# Patient Record
Sex: Female | Born: 1970 | Race: Black or African American | Hispanic: No | Marital: Married | Smoking: Never smoker
Health system: Southern US, Community
[De-identification: ages and names within clinical notes are randomized; demographics above are authoritative.]

## PROBLEM LIST (undated history)

## (undated) DIAGNOSIS — Z803 Family history of malignant neoplasm of breast: Secondary | ICD-10-CM

## (undated) DIAGNOSIS — Z8042 Family history of malignant neoplasm of prostate: Secondary | ICD-10-CM

## (undated) DIAGNOSIS — Z9221 Personal history of antineoplastic chemotherapy: Secondary | ICD-10-CM

## (undated) DIAGNOSIS — C801 Malignant (primary) neoplasm, unspecified: Secondary | ICD-10-CM

## (undated) DIAGNOSIS — Z923 Personal history of irradiation: Secondary | ICD-10-CM

## (undated) HISTORY — DX: Family history of malignant neoplasm of breast: Z80.3

## (undated) HISTORY — DX: Family history of malignant neoplasm of prostate: Z80.42

---

## 1999-09-12 ENCOUNTER — Emergency Department (HOSPITAL_COMMUNITY): Admission: EM | Admit: 1999-09-12 | Discharge: 1999-09-12 | Payer: Self-pay | Admitting: Emergency Medicine

## 2001-04-26 ENCOUNTER — Other Ambulatory Visit: Admission: RE | Admit: 2001-04-26 | Discharge: 2001-04-26 | Payer: Self-pay | Admitting: Obstetrics and Gynecology

## 2001-10-18 ENCOUNTER — Inpatient Hospital Stay (HOSPITAL_COMMUNITY): Admission: AD | Admit: 2001-10-18 | Discharge: 2001-10-22 | Payer: Self-pay | Admitting: Obstetrics and Gynecology

## 2001-10-18 ENCOUNTER — Encounter (INDEPENDENT_AMBULATORY_CARE_PROVIDER_SITE_OTHER): Payer: Self-pay

## 2001-10-23 ENCOUNTER — Encounter: Admission: RE | Admit: 2001-10-23 | Discharge: 2001-11-22 | Payer: Self-pay | Admitting: Obstetrics and Gynecology

## 2001-11-19 ENCOUNTER — Other Ambulatory Visit: Admission: RE | Admit: 2001-11-19 | Discharge: 2001-11-19 | Payer: Self-pay | Admitting: Obstetrics and Gynecology

## 2004-09-20 ENCOUNTER — Ambulatory Visit (HOSPITAL_COMMUNITY): Admission: RE | Admit: 2004-09-20 | Discharge: 2004-09-20 | Payer: Self-pay | Admitting: Obstetrics and Gynecology

## 2005-01-03 ENCOUNTER — Inpatient Hospital Stay (HOSPITAL_COMMUNITY): Admission: AD | Admit: 2005-01-03 | Discharge: 2005-01-07 | Payer: Self-pay | Admitting: Obstetrics and Gynecology

## 2005-01-03 ENCOUNTER — Encounter (INDEPENDENT_AMBULATORY_CARE_PROVIDER_SITE_OTHER): Payer: Self-pay | Admitting: *Deleted

## 2006-09-04 DIAGNOSIS — Z9221 Personal history of antineoplastic chemotherapy: Secondary | ICD-10-CM

## 2006-09-04 DIAGNOSIS — Z923 Personal history of irradiation: Secondary | ICD-10-CM

## 2006-09-04 DIAGNOSIS — C50919 Malignant neoplasm of unspecified site of unspecified female breast: Secondary | ICD-10-CM

## 2006-09-04 HISTORY — PX: BREAST LUMPECTOMY: SHX2

## 2006-09-04 HISTORY — DX: Personal history of irradiation: Z92.3

## 2006-09-04 HISTORY — DX: Personal history of antineoplastic chemotherapy: Z92.21

## 2006-09-04 HISTORY — DX: Malignant neoplasm of unspecified site of unspecified female breast: C50.919

## 2007-03-05 ENCOUNTER — Ambulatory Visit: Payer: Self-pay | Admitting: Family Medicine

## 2007-03-15 ENCOUNTER — Encounter: Admission: RE | Admit: 2007-03-15 | Discharge: 2007-03-15 | Payer: Self-pay | Admitting: Family Medicine

## 2007-03-29 ENCOUNTER — Encounter (INDEPENDENT_AMBULATORY_CARE_PROVIDER_SITE_OTHER): Payer: Self-pay | Admitting: Diagnostic Radiology

## 2007-03-29 ENCOUNTER — Encounter: Admission: RE | Admit: 2007-03-29 | Discharge: 2007-03-29 | Payer: Self-pay | Admitting: Family Medicine

## 2007-04-11 ENCOUNTER — Encounter: Admission: RE | Admit: 2007-04-11 | Discharge: 2007-04-11 | Payer: Self-pay | Admitting: Occupational Therapy

## 2007-04-15 ENCOUNTER — Ambulatory Visit (HOSPITAL_COMMUNITY): Admission: RE | Admit: 2007-04-15 | Discharge: 2007-04-15 | Payer: Self-pay | Admitting: General Surgery

## 2007-04-15 ENCOUNTER — Encounter (HOSPITAL_BASED_OUTPATIENT_CLINIC_OR_DEPARTMENT_OTHER): Payer: Self-pay | Admitting: General Surgery

## 2007-05-03 ENCOUNTER — Ambulatory Visit: Payer: Self-pay | Admitting: Oncology

## 2007-05-15 LAB — CBC WITH DIFFERENTIAL/PLATELET
BASO%: 0.5 % (ref 0.0–2.0)
Basophils Absolute: 0 10*3/uL (ref 0.0–0.1)
EOS%: 3.9 % (ref 0.0–7.0)
Eosinophils Absolute: 0.2 10*3/uL (ref 0.0–0.5)
HCT: 40 % (ref 34.8–46.6)
HGB: 13.8 g/dL (ref 11.6–15.9)
LYMPH%: 27.4 % (ref 14.0–48.0)
MCH: 29.8 pg (ref 26.0–34.0)
MCHC: 34.5 g/dL (ref 32.0–36.0)
MCV: 86.6 fL (ref 81.0–101.0)
MONO#: 0.3 10*3/uL (ref 0.1–0.9)
MONO%: 5.8 % (ref 0.0–13.0)
NEUT#: 3.7 10*3/uL (ref 1.5–6.5)
NEUT%: 62.4 % (ref 39.6–76.8)
Platelets: 251 10*3/uL (ref 145–400)
RBC: 4.61 10*6/uL (ref 3.70–5.32)
RDW: 13.1 % (ref 11.3–14.5)
WBC: 5.9 10*3/uL (ref 3.9–10.0)
lymph#: 1.6 10*3/uL (ref 0.9–3.3)

## 2007-05-16 ENCOUNTER — Encounter: Payer: Self-pay | Admitting: Oncology

## 2007-05-16 ENCOUNTER — Ambulatory Visit: Admission: RE | Admit: 2007-05-16 | Discharge: 2007-05-16 | Payer: Self-pay | Admitting: Oncology

## 2007-05-17 ENCOUNTER — Ambulatory Visit (HOSPITAL_COMMUNITY): Admission: RE | Admit: 2007-05-17 | Discharge: 2007-05-17 | Payer: Self-pay | Admitting: Oncology

## 2007-05-20 ENCOUNTER — Encounter (HOSPITAL_COMMUNITY): Admission: RE | Admit: 2007-05-20 | Discharge: 2007-06-04 | Payer: Self-pay | Admitting: Oncology

## 2007-05-20 LAB — COMPREHENSIVE METABOLIC PANEL
ALT: 9 U/L (ref 0–35)
AST: 15 U/L (ref 0–37)
Albumin: 4.5 g/dL (ref 3.5–5.2)
Alkaline Phosphatase: 64 U/L (ref 39–117)
BUN: 11 mg/dL (ref 6–23)
CO2: 26 mEq/L (ref 19–32)
Calcium: 9.8 mg/dL (ref 8.4–10.5)
Chloride: 104 mEq/L (ref 96–112)
Creatinine, Ser: 0.67 mg/dL (ref 0.40–1.20)
Glucose, Bld: 100 mg/dL — ABNORMAL HIGH (ref 70–99)
Potassium: 3.6 mEq/L (ref 3.5–5.3)
Sodium: 139 mEq/L (ref 135–145)
Total Bilirubin: 0.5 mg/dL (ref 0.3–1.2)
Total Protein: 7.8 g/dL (ref 6.0–8.3)

## 2007-05-20 LAB — LACTATE DEHYDROGENASE: LDH: 145 U/L (ref 94–250)

## 2007-05-20 LAB — CANCER ANTIGEN 27.29: CA 27.29: 24 U/mL (ref 0–39)

## 2007-05-20 LAB — VITAMIN D PNL(25-HYDRXY+1,25-DIHY)-BLD
Vit D, 1,25-Dihydroxy: 26 pg/mL (ref 6–62)
Vit D, 25-Hydroxy: 11 ng/mL — ABNORMAL LOW (ref 20–57)

## 2007-05-22 ENCOUNTER — Ambulatory Visit (HOSPITAL_BASED_OUTPATIENT_CLINIC_OR_DEPARTMENT_OTHER): Admission: RE | Admit: 2007-05-22 | Discharge: 2007-05-22 | Payer: Self-pay | Admitting: General Surgery

## 2007-05-22 ENCOUNTER — Ambulatory Visit: Payer: Self-pay | Admitting: *Deleted

## 2007-05-31 LAB — PROTIME-INR
INR: 1.4 — ABNORMAL LOW (ref 2.00–3.50)
Protime: 16.8 Seconds — ABNORMAL HIGH (ref 10.6–13.4)

## 2007-06-04 LAB — PROTIME-INR
INR: 2.3 (ref 2.00–3.50)
Protime: 27.6 Seconds — ABNORMAL HIGH (ref 10.6–13.4)

## 2007-06-04 LAB — CBC WITH DIFFERENTIAL/PLATELET
BASO%: 1.1 % (ref 0.0–2.0)
Basophils Absolute: 0 10*3/uL (ref 0.0–0.1)
EOS%: 3.4 % (ref 0.0–7.0)
Eosinophils Absolute: 0.1 10*3/uL (ref 0.0–0.5)
HCT: 39.3 % (ref 34.8–46.6)
HGB: 12.9 g/dL (ref 11.6–15.9)
LYMPH%: 29.1 % (ref 14.0–48.0)
MCH: 29.2 pg (ref 26.0–34.0)
MCHC: 32.8 g/dL (ref 32.0–36.0)
MCV: 89.1 fL (ref 81.0–101.0)
MONO#: 0.2 10*3/uL (ref 0.1–0.9)
MONO%: 5.6 % (ref 0.0–13.0)
NEUT#: 2.2 10*3/uL (ref 1.5–6.5)
NEUT%: 60.9 % (ref 39.6–76.8)
Platelets: 166 10*3/uL (ref 145–400)
RBC: 4.41 10*6/uL (ref 3.70–5.32)
RDW: 10.5 % — ABNORMAL LOW (ref 11.3–14.5)
WBC: 3.7 10*3/uL — ABNORMAL LOW (ref 3.9–10.0)
lymph#: 1.1 10*3/uL (ref 0.9–3.3)

## 2007-06-10 LAB — COMPREHENSIVE METABOLIC PANEL
ALT: 12 U/L (ref 0–35)
AST: 16 U/L (ref 0–37)
Albumin: 4.2 g/dL (ref 3.5–5.2)
Alkaline Phosphatase: 75 U/L (ref 39–117)
BUN: 14 mg/dL (ref 6–23)
CO2: 24 mEq/L (ref 19–32)
Calcium: 9.2 mg/dL (ref 8.4–10.5)
Chloride: 106 mEq/L (ref 96–112)
Creatinine, Ser: 0.6 mg/dL (ref 0.40–1.20)
Glucose, Bld: 89 mg/dL (ref 70–99)
Potassium: 4.2 mEq/L (ref 3.5–5.3)
Sodium: 140 mEq/L (ref 135–145)
Total Bilirubin: 0.1 mg/dL — ABNORMAL LOW (ref 0.3–1.2)
Total Protein: 6.9 g/dL (ref 6.0–8.3)

## 2007-06-10 LAB — CBC WITH DIFFERENTIAL/PLATELET
BASO%: 0.6 % (ref 0.0–2.0)
Basophils Absolute: 0.1 10*3/uL (ref 0.0–0.1)
EOS%: 0.3 % (ref 0.0–7.0)
Eosinophils Absolute: 0 10*3/uL (ref 0.0–0.5)
HCT: 38.8 % (ref 34.8–46.6)
HGB: 13.1 g/dL (ref 11.6–15.9)
LYMPH%: 18.9 % (ref 14.0–48.0)
MCH: 29.6 pg (ref 26.0–34.0)
MCHC: 33.9 g/dL (ref 32.0–36.0)
MCV: 87.5 fL (ref 81.0–101.0)
MONO#: 0.6 10*3/uL (ref 0.1–0.9)
MONO%: 6.5 % (ref 0.0–13.0)
NEUT#: 7.3 10*3/uL — ABNORMAL HIGH (ref 1.5–6.5)
NEUT%: 73.8 % (ref 39.6–76.8)
Platelets: 217 10*3/uL (ref 145–400)
RBC: 4.43 10*6/uL (ref 3.70–5.32)
RDW: 10.3 % — ABNORMAL LOW (ref 11.3–14.5)
WBC: 9.9 10*3/uL (ref 3.9–10.0)
lymph#: 1.9 10*3/uL (ref 0.9–3.3)

## 2007-06-10 LAB — PROTIME-INR
INR: 2.7 (ref 2.00–3.50)
Protime: 32.4 Seconds — ABNORMAL HIGH (ref 10.6–13.4)

## 2007-06-14 ENCOUNTER — Ambulatory Visit: Payer: Self-pay | Admitting: Oncology

## 2007-06-18 LAB — CBC WITH DIFFERENTIAL/PLATELET
BASO%: 0.9 % (ref 0.0–2.0)
Basophils Absolute: 0 10*3/uL (ref 0.0–0.1)
EOS%: 0.3 % (ref 0.0–7.0)
Eosinophils Absolute: 0 10*3/uL (ref 0.0–0.5)
HCT: 37.3 % (ref 34.8–46.6)
HGB: 12.4 g/dL (ref 11.6–15.9)
LYMPH%: 21.9 % (ref 14.0–48.0)
MCH: 29.1 pg (ref 26.0–34.0)
MCHC: 33.4 g/dL (ref 32.0–36.0)
MCV: 87.3 fL (ref 81.0–101.0)
MONO#: 0.2 10*3/uL (ref 0.1–0.9)
MONO%: 4.7 % (ref 0.0–13.0)
NEUT#: 3.7 10*3/uL (ref 1.5–6.5)
NEUT%: 72.2 % (ref 39.6–76.8)
Platelets: 206 10*3/uL (ref 145–400)
RBC: 4.27 10*6/uL (ref 3.70–5.32)
RDW: 10.4 % — ABNORMAL LOW (ref 11.3–14.5)
WBC: 5.1 10*3/uL (ref 3.9–10.0)
lymph#: 1.1 10*3/uL (ref 0.9–3.3)

## 2007-06-18 LAB — PROTIME-INR
INR: 1.5 — ABNORMAL LOW (ref 2.00–3.50)
Protime: 18 Seconds — ABNORMAL HIGH (ref 10.6–13.4)

## 2007-06-24 LAB — CBC WITH DIFFERENTIAL/PLATELET
BASO%: 0.5 % (ref 0.0–2.0)
Basophils Absolute: 0.1 10*3/uL (ref 0.0–0.1)
EOS%: 0.3 % (ref 0.0–7.0)
Eosinophils Absolute: 0 10*3/uL (ref 0.0–0.5)
HCT: 33.7 % — ABNORMAL LOW (ref 34.8–46.6)
HGB: 11.6 g/dL (ref 11.6–15.9)
LYMPH%: 11.3 % — ABNORMAL LOW (ref 14.0–48.0)
MCH: 29.9 pg (ref 26.0–34.0)
MCHC: 34.4 g/dL (ref 32.0–36.0)
MCV: 86.9 fL (ref 81.0–101.0)
MONO#: 0.9 10*3/uL (ref 0.1–0.9)
MONO%: 6.8 % (ref 0.0–13.0)
NEUT#: 11.3 10*3/uL — ABNORMAL HIGH (ref 1.5–6.5)
NEUT%: 81.1 % — ABNORMAL HIGH (ref 39.6–76.8)
Platelets: 236 10*3/uL (ref 145–400)
RBC: 3.88 10*6/uL (ref 3.70–5.32)
RDW: 10.6 % — ABNORMAL LOW (ref 11.3–14.5)
WBC: 14 10*3/uL — ABNORMAL HIGH (ref 3.9–10.0)
lymph#: 1.6 10*3/uL (ref 0.9–3.3)

## 2007-06-24 LAB — PROTIME-INR
INR: 2.6 (ref 2.00–3.50)
Protime: 31.2 Seconds — ABNORMAL HIGH (ref 10.6–13.4)

## 2007-07-02 LAB — CBC WITH DIFFERENTIAL/PLATELET
BASO%: 0.9 % (ref 0.0–2.0)
Basophils Absolute: 0 10*3/uL (ref 0.0–0.1)
EOS%: 0.9 % (ref 0.0–7.0)
Eosinophils Absolute: 0 10*3/uL (ref 0.0–0.5)
HCT: 34.1 % — ABNORMAL LOW (ref 34.8–46.6)
HGB: 11.9 g/dL (ref 11.6–15.9)
LYMPH%: 16.5 % (ref 14.0–48.0)
MCH: 29.5 pg (ref 26.0–34.0)
MCHC: 34.8 g/dL (ref 32.0–36.0)
MCV: 84.8 fL (ref 81.0–101.0)
MONO#: 0.3 10*3/uL (ref 0.1–0.9)
MONO%: 6.3 % (ref 0.0–13.0)
NEUT#: 3.8 10*3/uL (ref 1.5–6.5)
NEUT%: 75.4 % (ref 39.6–76.8)
Platelets: 202 10*3/uL (ref 145–400)
RBC: 4.02 10*6/uL (ref 3.70–5.32)
RDW: 10.3 % — ABNORMAL LOW (ref 11.3–14.5)
WBC: 5.1 10*3/uL (ref 3.9–10.0)
lymph#: 0.8 10*3/uL — ABNORMAL LOW (ref 0.9–3.3)

## 2007-07-08 LAB — COMPREHENSIVE METABOLIC PANEL
ALT: 19 U/L (ref 0–35)
AST: 17 U/L (ref 0–37)
Albumin: 4.3 g/dL (ref 3.5–5.2)
Alkaline Phosphatase: 87 U/L (ref 39–117)
BUN: 12 mg/dL (ref 6–23)
CO2: 23 mEq/L (ref 19–32)
Calcium: 9.2 mg/dL (ref 8.4–10.5)
Chloride: 104 mEq/L (ref 96–112)
Creatinine, Ser: 0.59 mg/dL (ref 0.40–1.20)
Glucose, Bld: 87 mg/dL (ref 70–99)
Potassium: 4.6 mEq/L (ref 3.5–5.3)
Sodium: 138 mEq/L (ref 135–145)
Total Bilirubin: 0.2 mg/dL — ABNORMAL LOW (ref 0.3–1.2)
Total Protein: 7.2 g/dL (ref 6.0–8.3)

## 2007-07-08 LAB — CBC WITH DIFFERENTIAL/PLATELET
BASO%: 0.7 % (ref 0.0–2.0)
Basophils Absolute: 0.1 10*3/uL (ref 0.0–0.1)
EOS%: 0.5 % (ref 0.0–7.0)
Eosinophils Absolute: 0.1 10*3/uL (ref 0.0–0.5)
HCT: 33.9 % — ABNORMAL LOW (ref 34.8–46.6)
HGB: 11.3 g/dL — ABNORMAL LOW (ref 11.6–15.9)
LYMPH%: 12.1 % — ABNORMAL LOW (ref 14.0–48.0)
MCH: 28.9 pg (ref 26.0–34.0)
MCHC: 33.4 g/dL (ref 32.0–36.0)
MCV: 86.4 fL (ref 81.0–101.0)
MONO#: 1.1 10*3/uL — ABNORMAL HIGH (ref 0.1–0.9)
MONO%: 8.2 % (ref 0.0–13.0)
NEUT#: 10.2 10*3/uL — ABNORMAL HIGH (ref 1.5–6.5)
NEUT%: 78.5 % — ABNORMAL HIGH (ref 39.6–76.8)
Platelets: 189 10*3/uL (ref 145–400)
RBC: 3.93 10*6/uL (ref 3.70–5.32)
RDW: 12 % (ref 11.3–14.5)
WBC: 13 10*3/uL — ABNORMAL HIGH (ref 3.9–10.0)
lymph#: 1.6 10*3/uL (ref 0.9–3.3)

## 2007-07-16 ENCOUNTER — Ambulatory Visit (HOSPITAL_COMMUNITY): Admission: RE | Admit: 2007-07-16 | Discharge: 2007-07-16 | Payer: Self-pay | Admitting: Oncology

## 2007-07-16 LAB — CBC WITH DIFFERENTIAL/PLATELET
BASO%: 1.5 % (ref 0.0–2.0)
Basophils Absolute: 0 10*3/uL (ref 0.0–0.1)
EOS%: 1.6 % (ref 0.0–7.0)
Eosinophils Absolute: 0 10*3/uL (ref 0.0–0.5)
HCT: 31.8 % — ABNORMAL LOW (ref 34.8–46.6)
HGB: 11 g/dL — ABNORMAL LOW (ref 11.6–15.9)
LYMPH%: 22.9 % (ref 14.0–48.0)
MCH: 29.5 pg (ref 26.0–34.0)
MCHC: 34.6 g/dL (ref 32.0–36.0)
MCV: 85.3 fL (ref 81.0–101.0)
MONO#: 0.3 10*3/uL (ref 0.1–0.9)
MONO%: 9.2 % (ref 0.0–13.0)
NEUT#: 2 10*3/uL (ref 1.5–6.5)
NEUT%: 64.8 % (ref 39.6–76.8)
Platelets: 166 10*3/uL (ref 145–400)
RBC: 3.72 10*6/uL (ref 3.70–5.32)
RDW: 11.5 % (ref 11.3–14.5)
WBC: 3 10*3/uL — ABNORMAL LOW (ref 3.9–10.0)
lymph#: 0.7 10*3/uL — ABNORMAL LOW (ref 0.9–3.3)

## 2007-07-22 LAB — CBC WITH DIFFERENTIAL/PLATELET
BASO%: 0.2 % (ref 0.0–2.0)
Basophils Absolute: 0 10*3/uL (ref 0.0–0.1)
EOS%: 0.7 % (ref 0.0–7.0)
Eosinophils Absolute: 0.1 10*3/uL (ref 0.0–0.5)
HCT: 29.2 % — ABNORMAL LOW (ref 34.8–46.6)
HGB: 9.9 g/dL — ABNORMAL LOW (ref 11.6–15.9)
LYMPH%: 10.7 % — ABNORMAL LOW (ref 14.0–48.0)
MCH: 29 pg (ref 26.0–34.0)
MCHC: 33.8 g/dL (ref 32.0–36.0)
MCV: 85.8 fL (ref 81.0–101.0)
MONO#: 0.6 10*3/uL (ref 0.1–0.9)
MONO%: 6.5 % (ref 0.0–13.0)
NEUT#: 7.2 10*3/uL — ABNORMAL HIGH (ref 1.5–6.5)
NEUT%: 81.9 % — ABNORMAL HIGH (ref 39.6–76.8)
Platelets: 217 10*3/uL (ref 145–400)
RBC: 3.4 10*6/uL — ABNORMAL LOW (ref 3.70–5.32)
RDW: 13.7 % (ref 11.3–14.5)
WBC: 8.8 10*3/uL (ref 3.9–10.0)
lymph#: 0.9 10*3/uL (ref 0.9–3.3)

## 2007-07-25 LAB — HYPERCOAGULABLE PANEL, COMPREHENSIVE
AntiThromb III Func: 121 % (ref 76–126)
Anticardiolipin IgA: <7 [APL'U] (ref <13)
Anticardiolipin IgG: <7 [GPL'U] (ref <11)
Anticardiolipin IgM: <7 [MPL'U] (ref <10)
Beta-2 Glyco I IgG: <4 U/mL (ref <20)
Beta-2-Glycoprotein I IgA: 4 U/mL (ref <10)
Beta-2-Glycoprotein I IgM: <4 U/mL (ref <10)
DRVVT: 37.9 secs (ref 36.1–47.0)
Homocysteine: 8 umol/L (ref 4.0–15.4)
PTT Lupus Anticoagulant: 36.8 secs (ref 36.3–48.8)
Protein C Activity: 119 % (ref 75–133)
Protein C, Total: 76 % (ref 70–140)
Protein S Activity: 45 % — ABNORMAL LOW (ref 69–129)
Protein S Ag, Total: 99 % (ref 70–140)

## 2007-07-29 LAB — CBC WITH DIFFERENTIAL/PLATELET
BASO%: 1.1 % (ref 0.0–2.0)
Basophils Absolute: 0.1 10*3/uL (ref 0.0–0.1)
EOS%: 0.7 % (ref 0.0–7.0)
Eosinophils Absolute: 0 10*3/uL (ref 0.0–0.5)
HCT: 28.7 % — ABNORMAL LOW (ref 34.8–46.6)
HGB: 9.7 g/dL — ABNORMAL LOW (ref 11.6–15.9)
LYMPH%: 14 % (ref 14.0–48.0)
MCH: 29.2 pg (ref 26.0–34.0)
MCHC: 33.9 g/dL (ref 32.0–36.0)
MCV: 86 fL (ref 81.0–101.0)
MONO#: 0.5 10*3/uL (ref 0.1–0.9)
MONO%: 7.7 % (ref 0.0–13.0)
NEUT#: 4.5 10*3/uL (ref 1.5–6.5)
NEUT%: 76.5 % (ref 39.6–76.8)
Platelets: 310 10*3/uL (ref 145–400)
RBC: 3.33 10*6/uL — ABNORMAL LOW (ref 3.70–5.32)
RDW: 13.5 % (ref 11.3–14.5)
WBC: 5.9 10*3/uL (ref 3.9–10.0)
lymph#: 0.8 10*3/uL — ABNORMAL LOW (ref 0.9–3.3)

## 2007-07-30 ENCOUNTER — Ambulatory Visit: Payer: Self-pay | Admitting: Oncology

## 2007-08-05 LAB — CBC WITH DIFFERENTIAL/PLATELET
BASO%: 1.7 % (ref 0.0–2.0)
Basophils Absolute: 0 10*3/uL (ref 0.0–0.1)
EOS%: 0.5 % (ref 0.0–7.0)
Eosinophils Absolute: 0 10*3/uL (ref 0.0–0.5)
HCT: 31.4 % — ABNORMAL LOW (ref 34.8–46.6)
HGB: 10.6 g/dL — ABNORMAL LOW (ref 11.6–15.9)
LYMPH%: 23.2 % (ref 14.0–48.0)
MCH: 29.7 pg (ref 26.0–34.0)
MCHC: 33.8 g/dL (ref 32.0–36.0)
MCV: 87.8 fL (ref 81.0–101.0)
MONO#: 0.8 10*3/uL (ref 0.1–0.9)
MONO%: 34.3 % — ABNORMAL HIGH (ref 0.0–13.0)
NEUT#: 1 10*3/uL — ABNORMAL LOW (ref 1.5–6.5)
NEUT%: 40.3 % (ref 39.6–76.8)
Platelets: 289 10*3/uL (ref 145–400)
RBC: 3.57 10*6/uL — ABNORMAL LOW (ref 3.70–5.32)
RDW: 15.5 % — ABNORMAL HIGH (ref 11.3–14.5)
WBC: 2.5 10*3/uL — ABNORMAL LOW (ref 3.9–10.0)
lymph#: 0.6 10*3/uL — ABNORMAL LOW (ref 0.9–3.3)

## 2007-08-12 LAB — CBC WITH DIFFERENTIAL/PLATELET
BASO%: 2.2 % — ABNORMAL HIGH (ref 0.0–2.0)
Basophils Absolute: 0.1 10*3/uL (ref 0.0–0.1)
EOS%: 4.8 % (ref 0.0–7.0)
Eosinophils Absolute: 0.1 10*3/uL (ref 0.0–0.5)
HCT: 30.5 % — ABNORMAL LOW (ref 34.8–46.6)
HGB: 10.4 g/dL — ABNORMAL LOW (ref 11.6–15.9)
LYMPH%: 56.3 % — ABNORMAL HIGH (ref 14.0–48.0)
MCH: 30 pg (ref 26.0–34.0)
MCHC: 34.3 g/dL (ref 32.0–36.0)
MCV: 87.4 fL (ref 81.0–101.0)
MONO#: 0.6 10*3/uL (ref 0.1–0.9)
MONO%: 21.6 % — ABNORMAL HIGH (ref 0.0–13.0)
NEUT#: 0.4 10*3/uL — CL (ref 1.5–6.5)
NEUT%: 15.1 % — ABNORMAL LOW (ref 39.6–76.8)
Platelets: 396 10*3/uL (ref 145–400)
RBC: 3.48 10*6/uL — ABNORMAL LOW (ref 3.70–5.32)
RDW: 17.6 % — ABNORMAL HIGH (ref 11.3–14.5)
WBC: 2.8 10*3/uL — ABNORMAL LOW (ref 3.9–10.0)
lymph#: 1.6 10*3/uL (ref 0.9–3.3)

## 2007-08-12 LAB — TECHNOLOGIST REVIEW

## 2007-08-19 LAB — CBC WITH DIFFERENTIAL/PLATELET
BASO%: 1.1 % (ref 0.0–2.0)
Basophils Absolute: 0 10*3/uL (ref 0.0–0.1)
EOS%: 3.1 % (ref 0.0–7.0)
Eosinophils Absolute: 0.1 10*3/uL (ref 0.0–0.5)
HCT: 32.7 % — ABNORMAL LOW (ref 34.8–46.6)
HGB: 10.6 g/dL — ABNORMAL LOW (ref 11.6–15.9)
LYMPH%: 39.9 % (ref 14.0–48.0)
MCH: 29.3 pg (ref 26.0–34.0)
MCHC: 32.5 g/dL (ref 32.0–36.0)
MCV: 90.2 fL (ref 81.0–101.0)
MONO#: 0.5 10*3/uL (ref 0.1–0.9)
MONO%: 14.9 % — ABNORMAL HIGH (ref 0.0–13.0)
NEUT#: 1.5 10*3/uL (ref 1.5–6.5)
NEUT%: 41 % (ref 39.6–76.8)
Platelets: 331 10*3/uL (ref 145–400)
RBC: 3.63 10*6/uL — ABNORMAL LOW (ref 3.70–5.32)
RDW: 14.3 % (ref 11.3–14.5)
WBC: 3.7 10*3/uL — ABNORMAL LOW (ref 3.9–10.0)
lymph#: 1.5 10*3/uL (ref 0.9–3.3)

## 2007-08-26 LAB — CBC WITH DIFFERENTIAL/PLATELET
BASO%: 0.5 % (ref 0.0–2.0)
Basophils Absolute: 0 10*3/uL (ref 0.0–0.1)
EOS%: 2.2 % (ref 0.0–7.0)
Eosinophils Absolute: 0.1 10*3/uL (ref 0.0–0.5)
HCT: 33.1 % — ABNORMAL LOW (ref 34.8–46.6)
HGB: 11.4 g/dL — ABNORMAL LOW (ref 11.6–15.9)
LYMPH%: 25.3 % (ref 14.0–48.0)
MCH: 30.1 pg (ref 26.0–34.0)
MCHC: 34.6 g/dL (ref 32.0–36.0)
MCV: 87.1 fL (ref 81.0–101.0)
MONO#: 0.2 10*3/uL (ref 0.1–0.9)
MONO%: 5.7 % (ref 0.0–13.0)
NEUT#: 2.9 10*3/uL (ref 1.5–6.5)
NEUT%: 66.3 % (ref 39.6–76.8)
Platelets: 212 10*3/uL (ref 145–400)
RBC: 3.8 10*6/uL (ref 3.70–5.32)
RDW: 17 % — ABNORMAL HIGH (ref 11.3–14.5)
WBC: 4.4 10*3/uL (ref 3.9–10.0)
lymph#: 1.1 10*3/uL (ref 0.9–3.3)

## 2007-09-05 ENCOUNTER — Ambulatory Visit: Admission: RE | Admit: 2007-09-05 | Discharge: 2007-11-10 | Payer: Self-pay | Admitting: Radiation Oncology

## 2007-09-16 ENCOUNTER — Ambulatory Visit: Payer: Self-pay | Admitting: Oncology

## 2007-09-16 LAB — CBC WITH DIFFERENTIAL/PLATELET
BASO%: 0.6 % (ref 0.0–2.0)
Basophils Absolute: 0 10*3/uL (ref 0.0–0.1)
EOS%: 2.2 % (ref 0.0–7.0)
Eosinophils Absolute: 0.1 10*3/uL (ref 0.0–0.5)
HCT: 34.5 % — ABNORMAL LOW (ref 34.8–46.6)
HGB: 11.2 g/dL — ABNORMAL LOW (ref 11.6–15.9)
LYMPH%: 31.9 % (ref 14.0–48.0)
MCH: 29.1 pg (ref 26.0–34.0)
MCHC: 32.4 g/dL (ref 32.0–36.0)
MCV: 90 fL (ref 81.0–101.0)
MONO#: 0.3 10*3/uL (ref 0.1–0.9)
MONO%: 8.6 % (ref 0.0–13.0)
NEUT#: 2 10*3/uL (ref 1.5–6.5)
NEUT%: 56.7 % (ref 39.6–76.8)
Platelets: 224 10*3/uL (ref 145–400)
RBC: 3.84 10*6/uL (ref 3.70–5.32)
RDW: 11.9 % (ref 11.3–14.5)
WBC: 3.6 10*3/uL — ABNORMAL LOW (ref 3.9–10.0)
lymph#: 1.1 10*3/uL (ref 0.9–3.3)

## 2007-11-21 ENCOUNTER — Ambulatory Visit: Payer: Self-pay | Admitting: Oncology

## 2007-11-25 LAB — COMPREHENSIVE METABOLIC PANEL
ALT: 20 U/L (ref 0–35)
AST: 24 U/L (ref 0–37)
Albumin: 4.2 g/dL (ref 3.5–5.2)
Alkaline Phosphatase: 66 U/L (ref 39–117)
BUN: 10 mg/dL (ref 6–23)
CO2: 25 mEq/L (ref 19–32)
Calcium: 9.5 mg/dL (ref 8.4–10.5)
Chloride: 103 mEq/L (ref 96–112)
Creatinine, Ser: 0.66 mg/dL (ref 0.40–1.20)
Glucose, Bld: 93 mg/dL (ref 70–99)
Potassium: 4.6 mEq/L (ref 3.5–5.3)
Sodium: 140 mEq/L (ref 135–145)
Total Bilirubin: 0.2 mg/dL — ABNORMAL LOW (ref 0.3–1.2)
Total Protein: 7.4 g/dL (ref 6.0–8.3)

## 2007-11-25 LAB — CBC WITH DIFFERENTIAL/PLATELET
BASO%: 0.4 % (ref 0.0–2.0)
Basophils Absolute: 0 10*3/uL (ref 0.0–0.1)
EOS%: 3.9 % (ref 0.0–7.0)
Eosinophils Absolute: 0.2 10*3/uL (ref 0.0–0.5)
HCT: 37.2 % (ref 34.8–46.6)
HGB: 12.8 g/dL (ref 11.6–15.9)
LYMPH%: 19.5 % (ref 14.0–48.0)
MCH: 29.6 pg (ref 26.0–34.0)
MCHC: 34.3 g/dL (ref 32.0–36.0)
MCV: 86.3 fL (ref 81.0–101.0)
MONO#: 0.3 10*3/uL (ref 0.1–0.9)
MONO%: 7.6 % (ref 0.0–13.0)
NEUT#: 2.7 10*3/uL (ref 1.5–6.5)
NEUT%: 68.6 % (ref 39.6–76.8)
Platelets: 217 10*3/uL (ref 145–400)
RBC: 4.3 10*6/uL (ref 3.70–5.32)
RDW: 11.8 % (ref 11.3–14.5)
WBC: 4 10*3/uL (ref 3.9–10.0)
lymph#: 0.8 10*3/uL — ABNORMAL LOW (ref 0.9–3.3)

## 2007-11-25 LAB — CANCER ANTIGEN 27.29: CA 27.29: 17 U/mL (ref 0–39)

## 2007-11-25 LAB — LACTATE DEHYDROGENASE: LDH: 156 U/L (ref 94–250)

## 2007-12-26 ENCOUNTER — Ambulatory Visit (HOSPITAL_BASED_OUTPATIENT_CLINIC_OR_DEPARTMENT_OTHER): Admission: RE | Admit: 2007-12-26 | Discharge: 2007-12-26 | Payer: Self-pay | Admitting: General Surgery

## 2008-01-23 ENCOUNTER — Ambulatory Visit: Payer: Self-pay | Admitting: Oncology

## 2008-03-16 ENCOUNTER — Encounter: Admission: RE | Admit: 2008-03-16 | Discharge: 2008-03-16 | Payer: Self-pay | Admitting: Oncology

## 2008-05-04 ENCOUNTER — Encounter: Admission: RE | Admit: 2008-05-04 | Discharge: 2008-05-04 | Payer: Self-pay | Admitting: Oncology

## 2008-07-24 ENCOUNTER — Ambulatory Visit: Payer: Self-pay | Admitting: Oncology

## 2008-11-02 ENCOUNTER — Ambulatory Visit: Payer: Self-pay | Admitting: Oncology

## 2008-12-23 ENCOUNTER — Ambulatory Visit: Payer: Self-pay | Admitting: Oncology

## 2008-12-25 LAB — CBC WITH DIFFERENTIAL/PLATELET
BASO%: 0.5 % (ref 0.0–2.0)
Basophils Absolute: 0 10*3/uL (ref 0.0–0.1)
EOS%: 3 % (ref 0.0–7.0)
Eosinophils Absolute: 0.1 10*3/uL (ref 0.0–0.5)
HCT: 37.8 % (ref 34.8–46.6)
HGB: 12.7 g/dL (ref 11.6–15.9)
LYMPH%: 28.1 % (ref 14.0–49.7)
MCH: 30.3 pg (ref 25.1–34.0)
MCHC: 33.5 g/dL (ref 31.5–36.0)
MCV: 90.4 fL (ref 79.5–101.0)
MONO#: 0.3 10*3/uL (ref 0.1–0.9)
MONO%: 6.3 % (ref 0.0–14.0)
NEUT#: 2.7 10*3/uL (ref 1.5–6.5)
NEUT%: 62.1 % (ref 38.4–76.8)
Platelets: 199 10*3/uL (ref 145–400)
RBC: 4.19 10*6/uL (ref 3.70–5.45)
RDW: 12.8 % (ref 11.2–14.5)
WBC: 4.3 10*3/uL (ref 3.9–10.3)
lymph#: 1.2 10*3/uL (ref 0.9–3.3)

## 2008-12-25 LAB — COMPREHENSIVE METABOLIC PANEL
ALT: 13 U/L (ref 0–35)
AST: 17 U/L (ref 0–37)
Albumin: 3.5 g/dL (ref 3.5–5.2)
Alkaline Phosphatase: 59 U/L (ref 39–117)
BUN: 9 mg/dL (ref 6–23)
CO2: 31 mEq/L (ref 19–32)
Calcium: 9 mg/dL (ref 8.4–10.5)
Chloride: 107 mEq/L (ref 96–112)
Creatinine, Ser: 0.64 mg/dL (ref 0.40–1.20)
Glucose, Bld: 111 mg/dL — ABNORMAL HIGH (ref 70–99)
Potassium: 3.7 mEq/L (ref 3.5–5.3)
Sodium: 140 mEq/L (ref 135–145)
Total Bilirubin: 0.5 mg/dL (ref 0.3–1.2)
Total Protein: 6 g/dL (ref 6.0–8.3)

## 2009-01-15 ENCOUNTER — Other Ambulatory Visit: Admission: RE | Admit: 2009-01-15 | Discharge: 2009-01-15 | Payer: Self-pay | Admitting: Family Medicine

## 2009-03-17 ENCOUNTER — Encounter: Admission: RE | Admit: 2009-03-17 | Discharge: 2009-03-17 | Payer: Self-pay | Admitting: Oncology

## 2009-06-24 ENCOUNTER — Ambulatory Visit: Payer: Self-pay | Admitting: Oncology

## 2009-09-10 ENCOUNTER — Ambulatory Visit: Payer: Self-pay | Admitting: Oncology

## 2009-09-14 LAB — CBC WITH DIFFERENTIAL/PLATELET
BASO%: 0.5 % (ref 0.0–2.0)
Basophils Absolute: 0 10*3/uL (ref 0.0–0.1)
EOS%: 3.1 % (ref 0.0–7.0)
Eosinophils Absolute: 0.2 10*3/uL (ref 0.0–0.5)
HCT: 40.2 % (ref 34.8–46.6)
HGB: 13.3 g/dL (ref 11.6–15.9)
LYMPH%: 26.5 % (ref 14.0–49.7)
MCH: 30.3 pg (ref 25.1–34.0)
MCHC: 33.2 g/dL (ref 31.5–36.0)
MCV: 91.2 fL (ref 79.5–101.0)
MONO#: 0.4 10*3/uL (ref 0.1–0.9)
MONO%: 6.8 % (ref 0.0–14.0)
NEUT#: 3.3 10*3/uL (ref 1.5–6.5)
NEUT%: 63.1 % (ref 38.4–76.8)
Platelets: 213 10*3/uL (ref 145–400)
RBC: 4.4 10*6/uL (ref 3.70–5.45)
RDW: 12.9 % (ref 11.2–14.5)
WBC: 5.2 10*3/uL (ref 3.9–10.3)
lymph#: 1.4 10*3/uL (ref 0.9–3.3)

## 2009-09-14 LAB — COMPREHENSIVE METABOLIC PANEL
ALT: 18 U/L (ref 0–35)
AST: 21 U/L (ref 0–37)
Albumin: 4 g/dL (ref 3.5–5.2)
Alkaline Phosphatase: 54 U/L (ref 39–117)
BUN: 8 mg/dL (ref 6–23)
CO2: 27 mEq/L (ref 19–32)
Calcium: 9.2 mg/dL (ref 8.4–10.5)
Chloride: 106 mEq/L (ref 96–112)
Creatinine, Ser: 0.52 mg/dL (ref 0.40–1.20)
Glucose, Bld: 78 mg/dL (ref 70–99)
Potassium: 3.8 mEq/L (ref 3.5–5.3)
Sodium: 139 mEq/L (ref 135–145)
Total Bilirubin: 0.6 mg/dL (ref 0.3–1.2)
Total Protein: 7.4 g/dL (ref 6.0–8.3)

## 2010-03-22 ENCOUNTER — Encounter: Admission: RE | Admit: 2010-03-22 | Discharge: 2010-03-22 | Payer: Self-pay | Admitting: Oncology

## 2010-03-25 ENCOUNTER — Ambulatory Visit: Payer: Self-pay | Admitting: Oncology

## 2010-06-08 ENCOUNTER — Ambulatory Visit (HOSPITAL_COMMUNITY): Admission: RE | Admit: 2010-06-08 | Discharge: 2010-06-08 | Payer: Self-pay | Admitting: Oncology

## 2010-09-25 ENCOUNTER — Encounter: Payer: Self-pay | Admitting: Oncology

## 2011-01-17 NOTE — Op Note (Signed)
Penny Brown, Penny Brown            ACCOUNT NO.:  [id number]   MEDICAL RECORD NO.:  [id number]          PATIENT TYPE:  AMB   LOCATION:  SDS                          FACILITY:  [facility]   PHYSICIAN:  Leonie Man, M.D.   DATE OF BIRTH:  01/30/1971   DATE OF PROCEDURE:  04/15/2007  DATE OF DISCHARGE:  04/15/2007                               OPERATIVE REPORT   PREOPERATIVE DIAGNOSIS:  Carcinoma, right breast.   POSTOPERATIVE DIAGNOSIS:  Carcinoma, right breast.   PROCEDURE:  Right partial mastectomy with right sentinel lymph node  biopsy.   SURGEON:  Leonie Man, M.D.   ASSISTANT:  Cindee Lame, PA student.   ANESTHESIA:  General.   SPECIMENS TO THE LAB:  1. Right breast mass.  2. Right axillary sentinel lymph nodes (four nodes).   NOTE:  Penny Brown is a 40 year old African American female with no  prior family history of breast cancer, who presents with a self-  identified right-sided breast mass.  This on ultrasound-guided biopsy  shows to be an invasive mammary carcinoma.  The patient underwent a  breast MRI, which shows a solitary lesion on the right breast at  approximately the 8 o'clock axis close to the inframammary line.  She  comes to the operating room now for wide local excision and sentinel  lymph node biopsy.  I have discussed the risks and potential benefits of  surgery with the patient, including the risks of axillary dissection and  the risks of right arm lymphedema even after sentinel lymph node biopsy.  She understands and gives her consent to same.   PROCEDURE:  The patient is positioned supinely and the right breast is  prepped and draped to be included in the sterile operative field.  Positive identification of the patient is carried out and an elliptical  incision is carried down around the right breast mass, deepened through  the skin and subcutaneous tissues.  I raised flaps superiorly and  inferiorly to get a wide margin around the breast.  I took  the  dissection all the way down to the pectoralis major muscle and to the  serratus anterior muscles, were the specimen was removed in its entirety  and forwarded for pathologic evaluation.  Touch preps on the specimen  showed clear margins.  However, the inferior margin was felt by the  pathologist to be somewhat close.  I therefore took some additional  inferior margin and sent this as a permanent specimen.   It should be noted that prior to starting surgery the patient had  undergone a technetium sulfur colloid injection around the nipple for a  sentinel lymph node biopsy and prior to incision, she also underwent  injection of methylene blue diluted solution for further identification  of the sentinel node.   Attention was then turned to the right axilla, where with the use of the  NeoProbe an area of increased radioactive uptake was noted and  transverse axillary incision is made and deepened through skin and  subcutaneous tissues, the dissection carried down using the NeoProbe as  a guide toward the sentinel lymph node.  A total  of four sentinel lymph  nodes were identified with using the NeoProbe.  Only one sentinel lymph  node had any significant indication of blue dye infiltration.  These  were all forwarded for pathologic evaluation.  Touch prep on all four  nodes showed no evidence of carcinoma.  Both wounds were thoroughly  checked for hemostasis and noted to be dry.  Sponge and instrument  counts were verified.  The breast wound was closed in two layers using  interrupted 2-0 Vicryl sutures to reapproximate the breast tissues to  the inframammary line.  I used 3-0 Vicryl sutures to close the  subcutaneous tissues and a 5-0 Vicryl suture to reapproximate the skin.  The axillary wound was closed with deep stitches in the 2-0 Vicryl and  subcutaneous tissues closed with 3-0 Vicryl and the skin closed with a 4-  0 Monocryl suture.  The wounds were reinforced with  Steri-Strips,  sterile dressings applied, the anesthetic reversed.  The patient removed  from the operating room to the recovery room in stable condition.  She  tolerated the procedure well.      Leonie Man, M.D.  Electronically Signed     PB/MEDQ  D:  04/15/2007  T:  04/16/2007  Job:  [id number]   cc:   [facility]

## 2011-01-17 NOTE — Op Note (Signed)
Penny Brown, Penny Brown            ACCOUNT NO.:  [id number]   MEDICAL RECORD NO.:  [id number]          PATIENT TYPE:  AMB   LOCATION:  DSC                          FACILITY:  [facility]   PHYSICIAN:  Leonie Man, M.D.   DATE OF BIRTH:  [birth year]   DATE OF PROCEDURE:  05/22/2007  DATE OF DISCHARGE:                               OPERATIVE REPORT   PREOPERATIVE DIAGNOSES:  1. Poor venous access.  2. Status post partial mastectomy for right-sided breast cancer.   POSTOPERATIVE DIAGNOSES:  1. Poor venous access.  2. Status post partial mastectomy for right-sided breast cancer.   PROCEDURE:  Port-A-Cath implantation.   SURGEON:  Leonie Man, M.D.   ASSISTANT:  OR tech.   ANESTHESIA:  General.   INDICATIONS:  Penny Brown is a 40 year old female who has a  recently-diagnosed right-sided breast cancer, status post partial  mastectomy, now being prepared for chemotherapy.  She comes to the  operating room for Port-A-Cath implantation after the risks and  potential benefits of surgery have been discussed, all questions  answered, consent obtained.   PROCEDURE:  The patient is positioned supinely and then placed in  Trendelenburg position.  The anterior chest and neck are prepped and  draped to be included in the sterile operative field.  The left  subclavian area is infiltrated with lidocaine 1% and a left-sided  subclavian stick is made into the left subclavian vein with a guidewire  been threaded into the superior vena cava under fluoroscopic guidance.  A pocket is then created on the anterior chest wall after infiltration  with lidocaine in this area and a Silastic catheter then is tunneled  through from the pocket up to the shoulder wound.  Using the Cook-type  introducer and dilator over the guidewire, the introducer is inserted  into the central venous system and the guidewire and dilator were  removed.  The Silastic catheter is then threaded into the central venous  system and positioned at the atrial-vena caval border.  Inflow of  heparinized saline and blood return are noted to be excellent.  The  external portion of the Silastic catheter in the pocket is then trimmed  and securely attached to the pre77flush reservoir and implanted into the  pocket.  The inflow of heparinized saline and blood return through the  reservoir is noted to be excellent.  The reservoir is then sutured down  into the pocket with 2-0 Prolene sutures.  Final fluoroscopic evaluation  shows the Port-A-Cath to be well-seated.  There are no unusual turns,  kinks or bends, with the course of the catheter into the vena cava.  The  tip of the catheter is noted to be at the atrial-vena caval junction.   Sponge and instrument counts are verified.  The incision closed in two  layers with 3-0 Vicryl and 4-0 Monocryl.  Shoulder incision closed  similarly with 3-0 Vicryl and 4-0 Monocryl.  A sterile dressing is  applied, the anesthetic reversed.  The patient removed from the  operating room to the recovery room in stable condition, where she will  go ahead and get a final chest  x-ray.      Leonie Man, M.D.  Electronically Signed     PB/MEDQ  D:  05/22/2007  T:  05/22/2007  Job:  [id number]

## 2011-01-20 NOTE — Discharge Summary (Signed)
NAMEJUEL, Penny Brown            ACCOUNT NO.:  [id number]   MEDICAL RECORD NO.:  [id number]          PATIENT TYPE:  INP   LOCATION:  9130                          FACILITY:  [facility]   PHYSICIAN:  Maxie Better, M.D.DATE OF BIRTH:  1971/02/11   DATE OF ADMISSION:  01/03/2005  DATE OF DISCHARGE:  01/07/2005                                 DISCHARGE SUMMARY   ADMISSION DIAGNOSES:  1.  Early labor.  2.  Intrauterine gestation at 39-5/7 weeks.  3.  Previous cesarean section.  4.  Desires sterilization.  5.  Desires attempt at vaginal delivery.   DISCHARGE DIAGNOSES:  1.  Term gestation, delivered.  2.  Desires sterilization.  3.  Repetitive deep variable decelerations.  4.  Repeat cesarean section.  5.  Postoperative anemia.  6.  Pelvic adhesions.   PROCEDURE:  Repeat cesarean section, modified Pomeroy tubal ligation, and  lysis of adhesions.   HISTORY OF PRESENT ILLNESS:  A 40 year old gravida 6, para 1-1-3-2, married  black female at 39-5/7 weeks with a previous low transverse cesarean section  secondary to abruption, who now presents at term with complaints of  contractions every 10 to 15 minutes and brownish vaginal discharge.  The  patient's prenatal course was notable for late prenatal care and microscopic  hematuria.  She is also noted to have had a cryosurgery in the past.   HOSPITAL COURSE:  The patient was admitted to [facility] and her  examination on admission was 100%, -1.  She had a reactive nonstress test  with the fetal heart rate between 140 and 145.  She was contracting every 2  minutes.  The patient had routine labs drawn.  She had spontaneous rupture  of membranes with clear fluid noted.  The patient was fully dilated at that  time, 0 station.  Tracing was noted to have variable decelerations on  external monitoring.  Internal scalp electrode was placed.  She was  contracting every 2 to 3 minutes.  While deciding on whether to place an  amnioinfusion, fetal heart rate did decrease to the 50's, not responsive to  positional changes, scalp stimulation.  The cervix was unchanged.  She had  repetitive deep variable decelerations.  Given the previous cesarean section  and a presentation that was not low enough to facilitate operative delivery,  an urgent cesarean section was called.  The patient reaffirmed her desire  for permanent sterilization despite the setting.  The patient was taken to  the operating room where she underwent a repeat cesarean section.  The  procedure resulted in delivery of a 6 pound 13 ounce live female in the  right occipitoanterior position with a cord around her left shoulder and  right arm.  Lower uterine segment had been intact on entering the pelvis.  Apgars were 9 and 9.  Cord pH was 7.26.  The placenta which was spontaneous,  intact, was sent to pathology and was notable for some mild  chorioamnionitis.  Bilateral tubal ligation was performed and pathology  confirmed complete transection.  The patient had bilateral paraovarian  adhesions that were lysed.  Some bladder adhesions  were also noted that were  lysed as well.  There was hematuria noted at the end of the surgery.  The  patient subsequently had resolution of that.  Her creatinine was 0.6.  On  postoperative day #3, the patient was complaining of her throat feeling  swollen.  She had no shortness of breath.  She had passed flatus.  Incision  had no erythema or induration and her lungs were clear to auscultation.  There was no cervical lymphadenopathy on examination.  Her pharynx was  without any edema or redness.  The etiology was unclear and salt water  gargles were prescribed.  The CBC on postoperative day #1 showed a  hematocrit of 29, hemoglobin 10, white count 10.9.  She had resolution of  her throat symptoms and by postoperative day #4 the patient was ready to go  home.  Her blood type was A positive, rubella immune.  She was deemed  well  to be discharged home.   DISPOSITION:  Home.   CONDITION ON DISCHARGE:  Stable.   DISCHARGE MEDICATIONS:  1.  Prenatal vitamins one p.o. daily.  2.  Motrin 800 mg one every 6 hours p.r.n. pain.  3.  Percocet one to two tablets every 3 to 4 hours p.r.n. pain.   DISCHARGE INSTRUCTIONS:  Per the postpartum booklet given.  Follow-up at  [facility] in 4 to 6 weeks.       [place]/MEDQ  D:  02/06/2005  T:  02/06/2005  Job:  [id number]

## 2011-01-20 NOTE — H&P (Signed)
[facility]  Patient:    Penny Brown, Penny Brown Visit Number: [id number] MRN: [id number]          Service Type: OBS Location: 910B [id number] 05 Attending Physician:  Lenoard Aden Dictated by:   Lenoard Aden, M.D. Admit Date:  10/18/2001                           History and Physical  CHIEF COMPLAINT:              Third trimester bleeding with nonreassuring fetal heart rate tracing.  HISTORY OF PRESENT ILLNESS:   The patient is a 40 year old African-American female, G5, P1-0-3-1, EDD November 29, 2001, at 34+ weeks who presents by EMS with heavy vaginal bleeding, nonreassuring fetal heart rate tracing.  PAST OBSTETRICAL HISTORY:     TAB x 3, spontaneous vaginal delivery in 1988. Obstetrical history up to this point reportedly uncomplicated.  ALLERGIES:                    No known drug allergies.  MEDICATIONS:                  Prenatal vitamins.  FAMILY HISTORY:               Remarkable for breast cancer and chronic hypertension.  PAST MEDICAL HISTORY:  The patient has no other medical or surgical hospitalizations.  PRENATAL LABORATORY DATA:     Blood type A positive, Rh antibody negative, sickle cell negative.  Hemoglobin electrophoresis normal.  Rubella immune. Hepatitis B surface antigen negative, HIV nonreactive.  Triple screen declined.  Glucose challenge within normal limits.  GBS not performed due to early gestational age.  PHYSICAL EXAMINATION:  GENERAL:                      The patient is a well-developed African-American female in moderate distress.  HEENT:                        Normal.  LUNGS:                        Clear.  HEART:                        Regular rhythm.  ABDOMEN:                      Soft, rigid.  Fundus nontender.  PELVIC:                       Copious amounts of dark clotted blood. Estimated blood loss 100 cc.  Cervical exam deferred.  EXTREMITIES:                  No cords.  NEUROLOGIC:                    Nonfocal.  IMPRESSION:                   1. Intrauterine pregnancy at 34 weeks.                               2. Acute third trimester bleeding.  PLAN:  Proceed with emergent C section. Dictated by:   Lenoard Aden, M.D. Attending Physician:  Lenoard Aden DD:  10/18/01 TD:  10/18/01 Job: [id number]

## 2011-04-05 ENCOUNTER — Other Ambulatory Visit: Payer: Self-pay | Admitting: Oncology

## 2011-04-05 DIAGNOSIS — Z9889 Other specified postprocedural states: Secondary | ICD-10-CM

## 2011-05-03 ENCOUNTER — Ambulatory Visit
Admission: RE | Admit: 2011-05-03 | Discharge: 2011-05-03 | Disposition: A | Discharge status: Home / Self Care | Payer: 59 | Source: Ambulatory Visit | Attending: Oncology | Admitting: Oncology

## 2011-05-03 DIAGNOSIS — Z9889 Other specified postprocedural states: Secondary | ICD-10-CM

## 2011-06-02 ENCOUNTER — Other Ambulatory Visit: Payer: Self-pay | Admitting: Family Medicine

## 2011-06-02 ENCOUNTER — Other Ambulatory Visit (HOSPITAL_COMMUNITY)
Admission: RE | Admit: 2011-06-02 | Discharge: 2011-06-02 | Disposition: A | Discharge status: Home / Self Care | Payer: 59 | Source: Ambulatory Visit | Attending: Family Medicine | Admitting: Family Medicine

## 2011-06-02 DIAGNOSIS — Z1159 Encounter for screening for other viral diseases: Secondary | ICD-10-CM | POA: Insufficient documentation

## 2011-06-02 DIAGNOSIS — Z124 Encounter for screening for malignant neoplasm of cervix: Secondary | ICD-10-CM | POA: Insufficient documentation

## 2011-06-15 LAB — POCT HEMOGLOBIN-HEMACUE
Hemoglobin: 12.5
Operator id: 123881

## 2011-06-19 LAB — DIFFERENTIAL
Basophils Absolute: 0
Basophils Relative: 0
Eosinophils Absolute: 0.2
Eosinophils Relative: 3
Lymphocytes Relative: 37
Lymphs Abs: 2.5
Monocytes Absolute: 0.4
Monocytes Relative: 6
Neutro Abs: 3.6
Neutrophils Relative %: 54

## 2011-06-19 LAB — COMPREHENSIVE METABOLIC PANEL
ALT: 13
AST: 18
Albumin: 4.1
Alkaline Phosphatase: 57
BUN: 8
CO2: 25
Calcium: 9.7
Chloride: 106
Creatinine, Ser: 0.58
GFR calc Af Amer: >60
GFR calc non Af Amer: >60
Glucose, Bld: 84
Potassium: 4.4
Sodium: 137
Total Bilirubin: 0.7
Total Protein: 7

## 2011-06-19 LAB — CBC
HCT: 40.4
Hemoglobin: 13.6
MCHC: 33.7
MCV: 86.7
Platelets: 255
RBC: 4.67
RDW: 12.9
WBC: 6.6

## 2011-06-19 LAB — PROTIME-INR
INR: 1
Prothrombin Time: 13.1

## 2012-03-29 ENCOUNTER — Other Ambulatory Visit: Payer: Self-pay | Admitting: Oncology

## 2012-03-29 DIAGNOSIS — Z853 Personal history of malignant neoplasm of breast: Secondary | ICD-10-CM

## 2012-05-24 ENCOUNTER — Ambulatory Visit
Admission: RE | Admit: 2012-05-24 | Discharge: 2012-05-24 | Disposition: A | Discharge status: Home / Self Care | Payer: 59 | Source: Ambulatory Visit | Attending: Oncology | Admitting: Oncology

## 2012-05-24 DIAGNOSIS — Z853 Personal history of malignant neoplasm of breast: Secondary | ICD-10-CM

## 2013-02-04 ENCOUNTER — Other Ambulatory Visit: Payer: Self-pay | Admitting: Oncology

## 2013-02-04 DIAGNOSIS — Z853 Personal history of malignant neoplasm of breast: Secondary | ICD-10-CM

## 2013-02-04 DIAGNOSIS — Z9889 Other specified postprocedural states: Secondary | ICD-10-CM

## 2013-06-18 ENCOUNTER — Other Ambulatory Visit: Payer: Self-pay | Admitting: Oncology

## 2013-06-18 DIAGNOSIS — Z853 Personal history of malignant neoplasm of breast: Secondary | ICD-10-CM

## 2013-06-18 DIAGNOSIS — Z9889 Other specified postprocedural states: Secondary | ICD-10-CM

## 2013-08-26 ENCOUNTER — Ambulatory Visit
Admission: RE | Admit: 2013-08-26 | Discharge: 2013-08-26 | Disposition: A | Discharge status: Home / Self Care | Payer: 59 | Source: Ambulatory Visit | Attending: Oncology | Admitting: Oncology

## 2013-08-26 DIAGNOSIS — Z9889 Other specified postprocedural states: Secondary | ICD-10-CM

## 2013-08-26 DIAGNOSIS — Z853 Personal history of malignant neoplasm of breast: Secondary | ICD-10-CM

## 2013-12-26 ENCOUNTER — Other Ambulatory Visit (HOSPITAL_COMMUNITY)
Admission: RE | Admit: 2013-12-26 | Discharge: 2013-12-26 | Disposition: A | Discharge status: Home / Self Care | Payer: 59 | Source: Ambulatory Visit | Attending: Family Medicine | Admitting: Family Medicine

## 2013-12-26 ENCOUNTER — Other Ambulatory Visit: Payer: Self-pay | Admitting: Family Medicine

## 2013-12-26 DIAGNOSIS — Z01419 Encounter for gynecological examination (general) (routine) without abnormal findings: Secondary | ICD-10-CM | POA: Insufficient documentation

## 2014-08-05 ENCOUNTER — Other Ambulatory Visit: Payer: Self-pay | Admitting: Oncology

## 2014-08-05 DIAGNOSIS — Z853 Personal history of malignant neoplasm of breast: Secondary | ICD-10-CM

## 2014-08-26 ENCOUNTER — Other Ambulatory Visit: Payer: Self-pay | Admitting: Oncology

## 2014-08-26 ENCOUNTER — Other Ambulatory Visit: Payer: Self-pay

## 2014-08-26 DIAGNOSIS — Z853 Personal history of malignant neoplasm of breast: Secondary | ICD-10-CM

## 2014-09-01 ENCOUNTER — Ambulatory Visit
Admission: RE | Admit: 2014-09-01 | Discharge: 2014-09-01 | Disposition: A | Discharge status: Home / Self Care | Payer: 59 | Source: Ambulatory Visit | Attending: Oncology | Admitting: Oncology

## 2014-09-01 DIAGNOSIS — Z853 Personal history of malignant neoplasm of breast: Secondary | ICD-10-CM

## 2015-07-12 ENCOUNTER — Other Ambulatory Visit: Payer: Self-pay

## 2015-07-12 DIAGNOSIS — Z1231 Encounter for screening mammogram for malignant neoplasm of breast: Secondary | ICD-10-CM

## 2015-09-02 ENCOUNTER — Ambulatory Visit
Admission: RE | Admit: 2015-09-02 | Discharge: 2015-09-02 | Disposition: A | Discharge status: Home / Self Care | Payer: 59 | Source: Ambulatory Visit

## 2015-09-02 DIAGNOSIS — Z1231 Encounter for screening mammogram for malignant neoplasm of breast: Secondary | ICD-10-CM

## 2016-07-09 ENCOUNTER — Encounter (HOSPITAL_COMMUNITY): Payer: Self-pay | Admitting: Family Medicine

## 2016-07-09 ENCOUNTER — Ambulatory Visit (HOSPITAL_COMMUNITY)
Admission: EM | Admit: 2016-07-09 | Discharge: 2016-07-09 | Disposition: A | Discharge status: Home / Self Care | Payer: 59 | Attending: Emergency Medicine | Admitting: Emergency Medicine

## 2016-07-09 DIAGNOSIS — R05 Cough: Secondary | ICD-10-CM

## 2016-07-09 DIAGNOSIS — J069 Acute upper respiratory infection, unspecified: Secondary | ICD-10-CM

## 2016-07-09 DIAGNOSIS — R059 Cough, unspecified: Secondary | ICD-10-CM

## 2016-07-09 MED ORDER — AMOXICILLIN 500 MG PO CAPS: 500.0000 mg | ORAL_CAPSULE | Freq: Two times a day (BID) | ORAL | 0 refills | Status: DC

## 2016-07-09 NOTE — ED Provider Notes (Signed)
CSN: [id number]     Arrival date & time 07/09/16  1423 History   First MD Initiated Contact with Patient 07/09/16 1601     Chief Complaint  Patient presents with  . Cough  . Nasal Congestion   (Consider location/radiation/quality/duration/timing/severity/associated sxs/prior Treatment) 45 yo black female presents today with a 7 day history of cough, congestion and body aches. She feels as though she is worsening. Her husband was diagnosed with pneumonia a few days back and she is concerned. Her cough is non-productive. Subjective fevers without chills. No dyspnea.       History reviewed. No pertinent past medical history. Past Surgical History:  Procedure Laterality Date  . BREAST LUMPECTOMY    . CESAREAN SECTION     History reviewed. No pertinent family history. Social History  Substance Use Topics  . Smoking status: Never Smoker  . Smokeless tobacco: Never Used  . Alcohol use Not on file   OB History    No data available     Review of Systems  Constitutional: Positive for fatigue and fever. Negative for chills.  HENT: Positive for congestion and sinus pressure. Negative for sneezing.   Eyes: Negative.   Respiratory: Positive for cough. Negative for shortness of breath, wheezing and stridor.   Allergic/Immunologic: Negative.     Allergies  Patient has no known allergies.  Home Medications   Prior to Admission medications   Medication Sig Start Date End Date Taking? Authorizing Provider  amoxicillin (AMOXIL) 500 MG capsule Take 1 capsule (500 mg total) by mouth 2 (two) times daily. 07/09/16   Bjorn Pippin, PA-C   Meds Ordered and Administered this Visit  Medications - No data to display  BP 125/80   Pulse 101   Temp 98.4 F (36.9 C)   Resp 18   LMP 06/26/2016   SpO2 98%  No data found.   Physical Exam  Constitutional: She is oriented to person, place, and time. She appears well-developed and well-nourished. No distress.  HR to 85 at recheck by myself   HENT:  Head: Normocephalic and atraumatic.  Right Ear: External ear normal.  Left Ear: External ear normal.  Mouth/Throat: Oropharynx is clear and moist. No oropharyngeal exudate.  Nasal turbinates with mild erythema and swelling  Cardiovascular: Normal rate and regular rhythm.   Pulmonary/Chest: Effort normal and breath sounds normal.  Lymphadenopathy:    She has no cervical adenopathy.  Neurological: She is alert and oriented to person, place, and time.  Skin: Skin is warm and dry. She is not diaphoretic.  Psychiatric: Her behavior is normal.  Nursing note and vitals reviewed.   Urgent Care Course   Clinical Course     Procedures (including critical care time)  Labs Review Labs Reviewed - No data to display  Imaging Review No results found.   Visual Acuity Review  Right Eye Distance:   Left Eye Distance:   Bilateral Distance:    Right Eye Near:   Left Eye Near:    Bilateral Near:         MDM   1. Acute upper respiratory infection   2. Cough    Given symptoms and duration will cover for bacterial infection. Treat with Amox. symptomatic care OTC Mucinex and Delsym. F/U here or with PCP if worsens.  No tachycardia on recheck by myself-see vitals    Bjorn Pippin, PA-C 07/09/16 1624

## 2016-07-09 NOTE — Discharge Instructions (Signed)
You most likely have a respiratory infection given overall feeling poorly and duration of symptoms. Will treat with amoxicillin. Also suggest use of Mucinex max strength with 4 oz of water twice a day. Use Delsym as directed for cough. Ibuprofen 600mg  every 8 hours will help with inflammation and fatigue. Get rest and drink lots of fluid. Feel better soon.

## 2016-07-09 NOTE — ED Triage Notes (Signed)
Pt here for cough, congestion, body aches, x 1 week. sts also taking thera flu.

## 2016-08-14 ENCOUNTER — Other Ambulatory Visit: Payer: Self-pay | Admitting: Family Medicine

## 2016-08-14 DIAGNOSIS — Z1231 Encounter for screening mammogram for malignant neoplasm of breast: Secondary | ICD-10-CM

## 2016-09-05 ENCOUNTER — Ambulatory Visit: Payer: 59

## 2016-11-24 ENCOUNTER — Ambulatory Visit
Admission: RE | Admit: 2016-11-24 | Discharge: 2016-11-24 | Disposition: A | Discharge status: Home / Self Care | Payer: 59 | Source: Ambulatory Visit | Attending: Family Medicine | Admitting: Family Medicine

## 2016-11-24 DIAGNOSIS — Z1231 Encounter for screening mammogram for malignant neoplasm of breast: Secondary | ICD-10-CM

## 2017-03-13 ENCOUNTER — Encounter (HOSPITAL_COMMUNITY): Payer: Self-pay | Admitting: Emergency Medicine

## 2017-03-13 ENCOUNTER — Ambulatory Visit (HOSPITAL_COMMUNITY)
Admission: EM | Admit: 2017-03-13 | Discharge: 2017-03-13 | Disposition: A | Discharge status: Home / Self Care | Payer: 59 | Attending: Family Medicine | Admitting: Family Medicine

## 2017-03-13 DIAGNOSIS — Z87440 Personal history of urinary (tract) infections: Secondary | ICD-10-CM | POA: Diagnosis present

## 2017-03-13 DIAGNOSIS — N3000 Acute cystitis without hematuria: Secondary | ICD-10-CM | POA: Diagnosis not present

## 2017-03-13 LAB — POCT URINALYSIS DIP (DEVICE)
Bilirubin Urine: NEGATIVE
Glucose, UA: NEGATIVE mg/dL
Ketones, ur: NEGATIVE mg/dL
Nitrite: NEGATIVE
Protein, ur: NEGATIVE mg/dL
Specific Gravity, Urine: 1.015 (ref 1.005–1.030)
Urobilinogen, UA: 0.2 mg/dL (ref 0.0–1.0)
pH: 7 (ref 5.0–8.0)

## 2017-03-13 LAB — POCT PREGNANCY, URINE: Preg Test, Ur: NEGATIVE

## 2017-03-13 MED ORDER — FLUCONAZOLE 200 MG PO TABS: ORAL_TABLET | ORAL | 0 refills | Status: DC

## 2017-03-13 MED ORDER — CEPHALEXIN 500 MG PO CAPS: 500.0000 mg | ORAL_CAPSULE | Freq: Four times a day (QID) | ORAL | 0 refills | Status: DC

## 2017-03-13 MED ORDER — PHENAZOPYRIDINE HCL 200 MG PO TABS: 200.0000 mg | ORAL_TABLET | Freq: Three times a day (TID) | ORAL | 0 refills | Status: DC | PRN

## 2017-03-13 NOTE — ED Triage Notes (Signed)
Onset 3 days ago of uti symptoms.  Patient has reported urgency, odor to urine, pressure.

## 2017-03-13 NOTE — ED Provider Notes (Signed)
CSN: [id number]     Arrival date & time 03/13/17  1751 History   None    Chief Complaint  Patient presents with  . Recurrent UTI   (Consider location/radiation/quality/duration/timing/severity/associated sxs/prior Treatment) The history is provided by the patient.  Dysuria  Pain quality:  Burning Pain severity:  Moderate Onset quality:  Gradual Duration:  3 days Timing:  Constant Progression:  Worsening Chronicity:  Recurrent Recent urinary tract infections: no   Relieved by:  Nothing Worsened by:  Nothing Ineffective treatments: water. Urinary symptoms: discolored urine, foul-smelling urine and frequent urination   Urinary symptoms: no hesitancy and no bladder incontinence   Associated symptoms: no abdominal pain, no fever, no flank pain, no nausea, no vaginal discharge and no vomiting     History reviewed. No pertinent past medical history. Past Surgical History:  Procedure Laterality Date  . BREAST LUMPECTOMY    . CESAREAN SECTION     No family history on file. Social History  Substance Use Topics  . Smoking status: Never Smoker  . Smokeless tobacco: Never Used  . Alcohol use No   OB History    No data available     Review of Systems  Constitutional: Negative for chills and fever.  HENT: Negative.   Respiratory: Negative.   Cardiovascular: Negative.   Gastrointestinal: Negative for abdominal pain, nausea and vomiting.  Genitourinary: Positive for dysuria. Negative for flank pain and vaginal discharge.  Musculoskeletal: Negative.   Skin: Negative.   Neurological: Negative.     Allergies  Patient has no known allergies.  Home Medications   Prior to Admission medications   Medication Sig Start Date End Date Taking? Authorizing Provider  cephALEXin (KEFLEX) 500 MG capsule Take 1 capsule (500 mg total) by mouth 4 (four) times daily. 03/13/17   Barnet Glasgow, NP  fluconazole (DIFLUCAN) 200 MG tablet Take one tablet today, wait 3 days, take the second  tablet 03/13/17   Barnet Glasgow, NP  phenazopyridine (PYRIDIUM) 200 MG tablet Take 1 tablet (200 mg total) by mouth 3 (three) times daily as needed for pain. 03/13/17   Barnet Glasgow, NP   Meds Ordered and Administered this Visit  Medications - No data to display  BP (!) 140/94 (BP Location: Left Arm) Comment: notified rn  Pulse 87   Temp 98.6 F (37 C) (Oral)   Resp 16   LMP 02/21/2017   SpO2 100%  No data found.   Physical Exam  Constitutional: She is oriented to person, place, and time. She appears well-developed and well-nourished. No distress.  HENT:  Head: Normocephalic and atraumatic.  Right Ear: External ear normal.  Left Ear: External ear normal.  Abdominal: Soft. There is no tenderness. There is no CVA tenderness.  Neurological: She is alert and oriented to person, place, and time.  Skin: Skin is warm and dry. Capillary refill takes less than 2 seconds. No rash noted. She is not diaphoretic. No erythema.  Psychiatric: She has a normal mood and affect. Her behavior is normal.  Nursing note and vitals reviewed.   Urgent Care Course     Procedures (including critical care time)  Labs Review Labs Reviewed  POCT URINALYSIS DIP (DEVICE) - Abnormal; Notable for the following:       Result Value   Hgb urine dipstick MODERATE (*)    Leukocytes, UA MODERATE (*)    All other components within normal limits  POCT PREGNANCY, URINE    Imaging Review No results found.  MDM   1. Acute cystitis without hematuria    Treating for UTI given Keflex and Pyridium, follow-up with primary care provider as needed.    Barnet Glasgow, NP 03/13/17 1956

## 2017-03-13 NOTE — Discharge Instructions (Signed)
You are being treated today for a urinary tract infection. I have prescribed Keflex, take 1 tablet 4 times a day for 5 days. I have also prescribed Pyridium. Take 1 tablet a day 3 times a day for 2 days. Your urine will be sent for culture and you will be notified should any change in therapy be needed. Drink plenty of fluids and rest. Should your symptoms fail to resolve, follow up with your primary care provider or return to clinic.  °

## 2017-03-16 LAB — URINE CULTURE: Culture: >=100000 — AB

## 2017-11-14 ENCOUNTER — Other Ambulatory Visit: Payer: Self-pay | Admitting: Family Medicine

## 2017-11-14 DIAGNOSIS — Z1231 Encounter for screening mammogram for malignant neoplasm of breast: Secondary | ICD-10-CM

## 2017-11-19 ENCOUNTER — Ambulatory Visit
Admission: RE | Admit: 2017-11-19 | Discharge: 2017-11-19 | Disposition: A | Discharge status: Home / Self Care | Payer: 59 | Source: Ambulatory Visit | Attending: Family Medicine | Admitting: Family Medicine

## 2017-11-19 DIAGNOSIS — Z1231 Encounter for screening mammogram for malignant neoplasm of breast: Secondary | ICD-10-CM

## 2017-11-19 HISTORY — DX: Personal history of antineoplastic chemotherapy: Z92.21

## 2017-11-19 HISTORY — DX: Personal history of irradiation: Z92.3

## 2017-12-03 ENCOUNTER — Ambulatory Visit: Payer: 59

## 2018-01-16 ENCOUNTER — Encounter: Payer: Self-pay | Admitting: Surgery

## 2018-10-16 ENCOUNTER — Other Ambulatory Visit: Payer: Self-pay | Admitting: Family Medicine

## 2018-10-16 DIAGNOSIS — Z1231 Encounter for screening mammogram for malignant neoplasm of breast: Secondary | ICD-10-CM

## 2018-11-15 ENCOUNTER — Ambulatory Visit
Admission: RE | Admit: 2018-11-15 | Discharge: 2018-11-15 | Disposition: A | Discharge status: Home / Self Care | Payer: 59 | Source: Ambulatory Visit | Attending: Family Medicine | Admitting: Family Medicine

## 2018-11-15 ENCOUNTER — Other Ambulatory Visit: Payer: Self-pay

## 2018-11-15 DIAGNOSIS — Z1231 Encounter for screening mammogram for malignant neoplasm of breast: Secondary | ICD-10-CM

## 2019-10-30 ENCOUNTER — Other Ambulatory Visit: Payer: Self-pay | Admitting: Family Medicine

## 2019-10-30 DIAGNOSIS — Z1231 Encounter for screening mammogram for malignant neoplasm of breast: Secondary | ICD-10-CM

## 2019-11-22 ENCOUNTER — Other Ambulatory Visit: Payer: Self-pay

## 2019-11-22 ENCOUNTER — Ambulatory Visit: Payer: 59 | Attending: Internal Medicine

## 2019-11-22 DIAGNOSIS — Z23 Encounter for immunization: Secondary | ICD-10-CM

## 2019-11-22 NOTE — Progress Notes (Signed)
   Covid-19 Vaccination Clinic  Name:  Penny Brown    MRN: [id number] DOB: Sep 25, 1970  11/22/2019  [name] was observed post Covid-19 immunization for 15 minutes without incident. She was provided with Vaccine Information Sheet and instruction to access the V-Safe system.   [name] was instructed to call 911 with any severe reactions post vaccine: Marland Kitchen Difficulty breathing  . Swelling of face and throat  . A fast heartbeat  . A bad rash all over body  . Dizziness and weakness   Immunizations Administered    Name Date Dose VIS Date Route   Pfizer COVID-19 Vaccine 11/22/2019 10:26 AM 0.3 mL 08/15/2019 Intramuscular   Manufacturer: [place]   Lot: [id number]   [place]: [id number]

## 2019-12-02 ENCOUNTER — Ambulatory Visit
Admission: RE | Admit: 2019-12-02 | Discharge: 2019-12-02 | Disposition: A | Discharge status: Home / Self Care | Payer: 59 | Source: Ambulatory Visit | Attending: Family Medicine | Admitting: Family Medicine

## 2019-12-02 ENCOUNTER — Other Ambulatory Visit: Payer: Self-pay

## 2019-12-02 DIAGNOSIS — Z1231 Encounter for screening mammogram for malignant neoplasm of breast: Secondary | ICD-10-CM

## 2019-12-02 HISTORY — DX: Malignant (primary) neoplasm, unspecified: C80.1

## 2019-12-17 ENCOUNTER — Ambulatory Visit: Payer: 59 | Attending: Internal Medicine

## 2019-12-17 DIAGNOSIS — Z23 Encounter for immunization: Secondary | ICD-10-CM

## 2019-12-17 NOTE — Progress Notes (Signed)
   Covid-19 Vaccination Clinic  Name:  Penny Brown    MRN: [id number] DOB: December 05, 1970  12/17/2019  [name] was observed post Covid-19 immunization for 15 minutes without incident. She was provided with Vaccine Information Sheet and instruction to access the V-Safe system.   [name] was instructed to call 911 with any severe reactions post vaccine: Marland Kitchen Difficulty breathing  . Swelling of face and throat  . A fast heartbeat  . A bad rash all over body  . Dizziness and weakness   Immunizations Administered    Name Date Dose VIS Date Route   Pfizer COVID-19 Vaccine 12/17/2019  3:39 PM 0.3 mL 08/15/2019 Intramuscular   Manufacturer: [place]   Lot: [id number]   [place]: [id number]

## 2019-12-22 ENCOUNTER — Ambulatory Visit: Payer: 59

## 2020-07-03 ENCOUNTER — Ambulatory Visit: Payer: 59 | Attending: Internal Medicine

## 2020-07-03 DIAGNOSIS — Z23 Encounter for immunization: Secondary | ICD-10-CM

## 2020-07-03 NOTE — Progress Notes (Signed)
   Covid-19 Vaccination Clinic  Name:  AMBERLI RUEGG    MRN: [id number] DOB: 12-30-1970  07/03/2020  Ms. Zumstein was observed post Covid-19 immunization for 15 minutes without incident. She was provided with Vaccine Information Sheet and instruction to access the V-Safe system.   Ms. Failla was instructed to call 911 with any severe reactions post vaccine: Marland Kitchen Difficulty breathing  . Swelling of face and throat  . A fast heartbeat  . A bad rash all over body  . Dizziness and weakness

## 2020-08-30 ENCOUNTER — Other Ambulatory Visit: Payer: Self-pay | Admitting: Nurse Practitioner

## 2020-08-30 DIAGNOSIS — N6310 Unspecified lump in the right breast, unspecified quadrant: Secondary | ICD-10-CM

## 2020-09-01 ENCOUNTER — Other Ambulatory Visit: Payer: 59

## 2020-09-01 DIAGNOSIS — Z20822 Contact with and (suspected) exposure to covid-19: Secondary | ICD-10-CM

## 2020-09-03 LAB — NOVEL CORONAVIRUS, NAA: SARS-CoV-2, NAA: NOT DETECTED

## 2020-09-03 LAB — SARS-COV-2, NAA 2 DAY TAT

## 2020-09-22 ENCOUNTER — Other Ambulatory Visit: Payer: Self-pay | Admitting: Radiology

## 2020-09-24 ENCOUNTER — Telehealth: Payer: Self-pay | Admitting: Oncology

## 2020-09-24 ENCOUNTER — Encounter: Payer: Self-pay | Admitting: *Deleted

## 2020-09-24 ENCOUNTER — Encounter: Payer: Self-pay | Admitting: Nurse Practitioner

## 2020-09-24 NOTE — Telephone Encounter (Signed)
Spoke with patient to confirm AM [facility] appointment for 1/26, will email packet to patient

## 2020-09-27 ENCOUNTER — Other Ambulatory Visit: Payer: Self-pay | Admitting: *Deleted

## 2020-09-27 DIAGNOSIS — Z171 Estrogen receptor negative status [ER-]: Secondary | ICD-10-CM | POA: Insufficient documentation

## 2020-09-27 DIAGNOSIS — C50211 Malignant neoplasm of upper-inner quadrant of right female breast: Secondary | ICD-10-CM | POA: Insufficient documentation

## 2020-09-28 NOTE — Progress Notes (Signed)
Penny Brown  [phone] Fax:([phone]) [phone]     ID: ERCEL NORMOYLE DOB: 1971-05-17  MR#: [id number]  KDT#:[id number]  Patient Care Team: Lucianne Lei, MD as PCP - General (Family Medicine) [name], RN as Oncology Nurse Navigator [name], RN as Oncology Nurse Navigator Lavar Rosenzweig, [name], MD as Consulting Physician (Oncology) [name], MD as Consulting Physician (General Surgery) [name], MD as Consulting Physician (Radiation Oncology) [name], MD OTHER MD:  CHIEF COMPLAINT: A second triple negative breast cancer  CURRENT TREATMENT: neoadjuvant chemotherapy   HISTORY OF CURRENT ILLNESS: Penny Brown has a history of 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 She received 4 cycles of doxorubicin and cyclophosphamide followed by 4 cycles of of paclitaxel, which was discontinued secondary to neuropathy. She was released from follow up here in 2012.  More recently, she presented with a palpable upper-inner right breast mass. She underwent bilateral diagnostic mammography with tomography and right breast ultrasonography at [facility] on 09/22/2020 showing: breast density category A; 2.2 cm irregular mass in upper-inner right breast; no significant abnormalities in right axilla.  Accordingly on 09/22/2020 she proceeded to biopsy of the right breast area in question. The pathology from this procedure ([id number]) showed: invasive ductal carcinoma, grade 3. Prognostic indicators significant for: estrogen receptor, 0% negative and progesterone receptor, 0% negative. Proliferation marker Ki67 at 80%. HER2 negatve by immunohistochemistry (1+).  Cancer Staging Malignant neoplasm of upper-inner quadrant of right breast in female, estrogen receptor negative ([place]) Staging form: Breast, AJCC 8th Edition - Clinical stage from 09/29/2020: Stage  IIB (rcT2, cN0, cM0, G3, ER-, PR-, HER2-) - Signed by [name], MD on 09/29/2020  Malignant neoplasm of upper-outer quadrant of right breast in female, estrogen receptor negative ([place]) Staging form: Breast, AJCC 8th Edition - Clinical: cT2, cN1, cM0, ER-, PR-, HER2- - Signed by [name], MD on 09/29/2020   The patient's subsequent history is as detailed below.   INTERVAL HISTORY: Penny Brown was evaluated in the multidisciplinary breast cancer clinic on 09/29/2020 accompanied by her husband can. Her case was also presented at the multidisciplinary breast cancer conference on the same day. At that time a preliminary plan was proposed: Neoadjuvant chemotherapy, surgery genetics testing   REVIEW OF SYSTEMS: On the provided questionnaire, Penny Brown reports wearing glasses, change in stool habits, and palpable breast lump with associated pain. The patient denies unusual headaches, visual changes, nausea, vomiting, stiff neck, dizziness, or gait imbalance. There has been no cough, phlegm production, or pleurisy, no chest pain or pressure, and no change in bladder habits. The patient denies fever, rash, bleeding, unexplained fatigue or unexplained weight loss. A detailed review of systems was otherwise entirely negative.   COVID 19 VACCINATION STATUS: fully vaccinated [employer]), with booster 06/2020   PAST MEDICAL HISTORY: Past Medical History:  Diagnosis Date  . Breast cancer [facility]) 2008   Right Breast Cancer  . Cancer ([place])   . Family history of breast cancer   . Family history of prostate cancer   . Personal history of chemotherapy 2008   Right Breast Cancer  . Personal history of radiation therapy 2008   Right Breast Cancer    PAST SURGICAL HISTORY: Past Surgical History:  Procedure Laterality Date  . BREAST LUMPECTOMY Right 2008  . CESAREAN SECTION      FAMILY HISTORY: Family History  Problem Relation Age of Onset  . Breast cancer Maternal  Grandmother 47  .  Prostate cancer Maternal Grandfather        dx 61s, metastatic    Her father died from heart attack (possibly Covid?) at age 36. Her mother is age 39 as of 09/2020. Kasi as 2 brothers and 2 sisters. She reports breast cancer in her maternal grandmother in her late 75's.   GYNECOLOGIC HISTORY:  No LMP recorded. Menarche: 50 years old Age at first live birth: 50 years old [place] P 3 LMP 09/09/2020, periods are regular, 7 days with 4 heavy Contraceptive: used for about 1 year HRT n/a  Hysterectomy? no BSO? 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 Husband [name] is a Cabin crew. She lives at home with [name], daughter [name], age 71, and daughter [name], age 41 who works as a Curator. Son [name], age 22, is a Psychiatric nurse in [employer]. Emmie has one grandchild. She attends [employer].    ADVANCED DIRECTIVES: In the absence of any documentation to the contrary, the patient's spouse is their HCPOA.    HEALTH MAINTENANCE: Social History   Tobacco Use  . Smoking status: Never Smoker  . Smokeless tobacco: Never Used  Substance Use Topics  . Alcohol use: No  . Drug use: No     Colonoscopy: n/a (age)   PAP: 2019  Bone density: n/a (age)   No Known Allergies  No current outpatient medications on file.   No current facility-administered medications for this visit.    OBJECTIVE: African-American woman who appears younger than stated age  13:   09/29/20 0902 09/29/20 0909  BP: 132/77 132/77  Pulse: 93 93  Resp: 18 18  Temp: 97.7 F (36.5 C) 97.7 F (36.5 C)  SpO2: 100% 100%     Body mass index is 27.45 kg/m.   Wt Readings from Last 3 Encounters:  09/29/20 170 lb 1.6 oz (77.2 kg)      ECOG FS:1 - Symptomatic but completely ambulatory  Ocular: Sclerae unicteric, pupils round and equal Ear-nose-throat: Wearing a mask Lymphatic: No cervical or supraclavicular  adenopathy Lungs no rales or rhonchi Heart regular rate and rhythm Abd soft, nontender, positive bowel sounds MSK no focal spinal tenderness, no joint edema Neuro: non-focal, well-oriented, appropriate affect Breasts: The right breast is status post prior lumpectomy and radiation.  Currently there is a new palpable mass in the upper inner quadrant which by palpation measures about 2 cm.  It is movable.  It does not involve the skin or nipple.  I do not palpate any mass in the left breast or either axilla   LAB RESULTS:  CMP     Component Value Date/Time   NA 140 09/29/2020 0830   K 3.8 09/29/2020 0830   CL 109 09/29/2020 0830   CO2 23 09/29/2020 0830   GLUCOSE 84 09/29/2020 0830   BUN 9 09/29/2020 0830   CREATININE 0.75 09/29/2020 0830   CALCIUM 8.9 09/29/2020 0830   PROT 7.3 09/29/2020 0830   ALBUMIN 3.6 09/29/2020 0830   AST 14 (L) 09/29/2020 0830   ALT 12 09/29/2020 0830   ALKPHOS 72 09/29/2020 0830   BILITOT 0.3 09/29/2020 0830   GFRNONAA >60 09/29/2020 0830   GFRAA  04/11/2007 1225    >60        The eGFR has been calculated using the MDRD equation. This calculation has not been validated in all clinical  No results found for: TOTALPROTELP, ALBUMINELP, A1GS, A2GS, BETS, BETA2SER, GAMS, MSPIKE, SPEI  Lab Results  Component Value Date   WBC 6.7 09/29/2020   NEUTROABS 4.3 09/29/2020   HGB 11.6 (L) 09/29/2020   HCT 36.6 09/29/2020   MCV 88.0 09/29/2020   PLT 253 09/29/2020    Lab Results  Component Value Date   LABCA2 17 11/25/2007    No components found for: [id number]  No results for input(s): INR in the last 168 hours.  Lab Results  Component Value Date   LABCA2 17 11/25/2007    No results found for: TKW409  No results found for: BDZ329  No results found for: JME268  No results found for: CA2729  No components found for: HGQUANT  No results found for: CEA1 / No results found for: CEA1   No results found for: AFPTUMOR  No results found  for: CHROMOGRNA  No results found for: KPAFRELGTCHN, LAMBDASER, KAPLAMBRATIO (kappa/lambda light chains)  No results found for: HGBA, HGBA2QUANT, HGBFQUANT, HGBSQUAN (Hemoglobinopathy evaluation)   Lab Results  Component Value Date   LDH 156 11/25/2007    No results found for: IRON, TIBC, IRONPCTSAT (Iron and TIBC)  No results found for: FERRITIN  Urinalysis    Component Value Date/Time   LABSPEC 1.015 03/13/2017 1905   PHURINE 7.0 03/13/2017 1905   GLUCOSEU NEGATIVE 03/13/2017 1905   HGBUR MODERATE (A) 03/13/2017 1905   BILIRUBINUR NEGATIVE 03/13/2017 [place] 03/13/2017 1905   PROTEINUR NEGATIVE 03/13/2017 1905   UROBILINOGEN 0.2 03/13/2017 1905   NITRITE NEGATIVE 03/13/2017 1905   LEUKOCYTESUR MODERATE (A) 03/13/2017 1905     STUDIES: No results found.   ELIGIBLE FOR AVAILABLE RESEARCH PROTOCOL: no  ASSESSMENT: 50 y.o. [place] woman  (1) status post right breast upper outer quadrant lumpectomy and sentinel lymph node sampling 04/15/2007 for a pT2 pN1, stage IIIB invasive ductal carcinoma, grade 3, triple negative, with an MIB-1 of 59%  (a) adjuvant chemotherapy consisted of doxorubicin and cyclophosphamide in dose dense fashion x4 followed by weekly paclitaxel x12  (b) received adjuvant radiation  (c) a total of 4 right axillary lymph nodes were removed  (2) status post right breast upper inner quadrant biopsy 09/22/2020 for a clinical T2 N2, stage IIIC invasive ductal carcinoma, grade 3, triple negative, with an MIB-1 of 80%.  (3) genetics testing  (4) adjuvant chemotherapy will consist of pembrolizumab, carboplatin, and paclitaxel  (5) definitive surgery to follow  PLAN: I met today with Gabryelle to review her new diagnosis. Specifically we discussed the biology of her breast cancer, its diagnosis, staging, treatment  options and prognosis.  She now has a new breast cancer in the ipsilateral breast.  This is if anything more aggressive  than the earlier 1, with a higher proliferation fraction and a larger T score.  The treatment of triple negative breast cancer is currently in transition but we have good data for using pembrolizumab both neoadjuvantly and adjuvantly in this setting.  Unfortunately she has already received the best available chemotherapy, namely AC and T.  We can give the paclitaxel a second try since she only received 4 doses previously.  That was limited by peripheral neuropathy . Presently she has no neuropathy symptoms.  We can combine that with carboplatin which can be especially helpful with triple negative tumors.  She will have genetics testing and it may be that we can find additional targets there.  We will also send her definitive surgical sample for foundation 1  testing.  Jeffifer has a good understanding of the situation.  She will have staging studies and port placement.  She will meet with our chemotherapy teaching nurse.  We are hoping to start her chemotherapy on 10/14/2020 and I will meet her that same day prior to treatment to make sure all her questions have been answered and everything is in place.  Adreanne has a good understanding of the overall plan. She agrees with it. She knows the goal of treatment in her case is cure. She will call with any problems that may develop before her next visit here.  Total encounter time 65 minutes.Sarajane Jews C. Jamarria Real, MD 09/29/2020 7:24 PM Medical Oncology and Hematology [facility] [place], [place] [address] Tel. [phone]    Fax. [phone]   This document serves as a record of services personally performed by Lurline Del, MD. It was created on his behalf by Wilburn Mylar, a trained medical scribe. The creation of this record is based on the scribe's personal observations and the provider's statements to them.   I, Lurline Del MD, have reviewed the above documentation for accuracy and completeness, and I  agree with the above.    *Total Encounter Time as defined by the Centers for Medicare and Medicaid Services includes, in addition to the face-to-face time of a patient visit (documented in the note above) non-face-to-face time: obtaining and reviewing outside history, ordering and reviewing medications, tests or procedures, care coordination (communications with other health care professionals or caregivers) and documentation in the medical record.

## 2020-09-29 ENCOUNTER — Other Ambulatory Visit: Payer: Self-pay

## 2020-09-29 ENCOUNTER — Ambulatory Visit: Payer: 59 | Admitting: Genetic Counselor

## 2020-09-29 ENCOUNTER — Other Ambulatory Visit: Payer: Self-pay | Admitting: General Surgery

## 2020-09-29 ENCOUNTER — Ambulatory Visit: Payer: 59 | Attending: General Surgery | Admitting: Physical Therapy

## 2020-09-29 ENCOUNTER — Encounter: Payer: Self-pay | Admitting: Licensed Clinical Social Worker

## 2020-09-29 ENCOUNTER — Other Ambulatory Visit: Payer: Self-pay | Admitting: *Deleted

## 2020-09-29 ENCOUNTER — Inpatient Hospital Stay: Payer: 59

## 2020-09-29 ENCOUNTER — Ambulatory Visit
Admission: RE | Admit: 2020-09-29 | Discharge: 2020-09-29 | Disposition: A | Discharge status: Home / Self Care | Payer: 59 | Source: Ambulatory Visit | Attending: Radiation Oncology | Admitting: Radiation Oncology

## 2020-09-29 ENCOUNTER — Encounter: Payer: Self-pay | Admitting: Physical Therapy

## 2020-09-29 ENCOUNTER — Inpatient Hospital Stay: Payer: 59 | Attending: Oncology | Admitting: Oncology

## 2020-09-29 ENCOUNTER — Encounter: Payer: Self-pay | Admitting: *Deleted

## 2020-09-29 ENCOUNTER — Encounter: Payer: Self-pay | Admitting: Genetic Counselor

## 2020-09-29 VITALS — BP 132/77 | HR 93 | Temp 97.7°F | Resp 18 | Ht 66.0 in | Wt 170.1 lb

## 2020-09-29 DIAGNOSIS — Z853 Personal history of malignant neoplasm of breast: Secondary | ICD-10-CM | POA: Insufficient documentation

## 2020-09-29 DIAGNOSIS — Z803 Family history of malignant neoplasm of breast: Secondary | ICD-10-CM | POA: Diagnosis not present

## 2020-09-29 DIAGNOSIS — C50411 Malignant neoplasm of upper-outer quadrant of right female breast: Secondary | ICD-10-CM | POA: Diagnosis not present

## 2020-09-29 DIAGNOSIS — Z8042 Family history of malignant neoplasm of prostate: Secondary | ICD-10-CM | POA: Diagnosis not present

## 2020-09-29 DIAGNOSIS — C50211 Malignant neoplasm of upper-inner quadrant of right female breast: Secondary | ICD-10-CM | POA: Insufficient documentation

## 2020-09-29 DIAGNOSIS — Z171 Estrogen receptor negative status [ER-]: Secondary | ICD-10-CM

## 2020-09-29 DIAGNOSIS — Z9221 Personal history of antineoplastic chemotherapy: Secondary | ICD-10-CM | POA: Insufficient documentation

## 2020-09-29 DIAGNOSIS — R293 Abnormal posture: Secondary | ICD-10-CM

## 2020-09-29 LAB — CMP (CANCER CENTER ONLY)
ALT: 12 U/L (ref 0–44)
AST: 14 U/L — ABNORMAL LOW (ref 15–41)
Albumin: 3.6 g/dL (ref 3.5–5.0)
Alkaline Phosphatase: 72 U/L (ref 38–126)
Anion gap: 8 (ref 5–15)
BUN: 9 mg/dL (ref 6–20)
CO2: 23 mmol/L (ref 22–32)
Calcium: 8.9 mg/dL (ref 8.9–10.3)
Chloride: 109 mmol/L (ref 98–111)
Creatinine: 0.75 mg/dL (ref 0.44–1.00)
GFR, Estimated: >60 mL/min (ref >60)
Glucose, Bld: 84 mg/dL (ref 70–99)
Potassium: 3.8 mmol/L (ref 3.5–5.1)
Sodium: 140 mmol/L (ref 135–145)
Total Bilirubin: 0.3 mg/dL (ref 0.3–1.2)
Total Protein: 7.3 g/dL (ref 6.5–8.1)

## 2020-09-29 LAB — CBC WITH DIFFERENTIAL (CANCER CENTER ONLY)
Abs Immature Granulocytes: 0.03 10*3/uL (ref 0.00–0.07)
Basophils Absolute: 0 10*3/uL (ref 0.0–0.1)
Basophils Relative: 0 %
Eosinophils Absolute: 0.2 10*3/uL (ref 0.0–0.5)
Eosinophils Relative: 3 %
HCT: 36.6 % (ref 36.0–46.0)
Hemoglobin: 11.6 g/dL — ABNORMAL LOW (ref 12.0–15.0)
Immature Granulocytes: 0 %
Lymphocytes Relative: 26 %
Lymphs Abs: 1.7 10*3/uL (ref 0.7–4.0)
MCH: 27.9 pg (ref 26.0–34.0)
MCHC: 31.7 g/dL (ref 30.0–36.0)
MCV: 88 fL (ref 80.0–100.0)
Monocytes Absolute: 0.4 10*3/uL (ref 0.1–1.0)
Monocytes Relative: 6 %
Neutro Abs: 4.3 10*3/uL (ref 1.7–7.7)
Neutrophils Relative %: 65 %
Platelet Count: 253 10*3/uL (ref 150–400)
RBC: 4.16 MIL/uL (ref 3.87–5.11)
RDW: 13.9 % (ref 11.5–15.5)
WBC Count: 6.7 10*3/uL (ref 4.0–10.5)
nRBC: 0 % (ref 0.0–0.2)

## 2020-09-29 LAB — GENETIC SCREENING ORDER

## 2020-09-29 MED ORDER — PROCHLORPERAZINE MALEATE 10 MG PO TABS: 10.0000 mg | ORAL_TABLET | Freq: Four times a day (QID) | ORAL | 1 refills | Status: DC | PRN

## 2020-09-29 MED ORDER — LIDOCAINE-PRILOCAINE 2.5-2.5 % EX CREA: TOPICAL_CREAM | CUTANEOUS | 3 refills | Status: DC

## 2020-09-29 NOTE — Progress Notes (Addendum)
START OFF PATHWAY REGIMEN - Breast   [id number]:Carboplatin AUC=1.5 IV D1,8,15 + Paclitaxel 80 mg/m2 IV D1,8,15 + G-CSF q21 Days + Pembrolizumab 400 mg IV D1 q42 Days x 12 Weeks Followed by [facility] + G-CSF q21 Days + Pembrolizumab 400 mg IV D1 q42 Days x 12 Weeks:   Pembrolizumab cycles 1 through 4: A cycle is every 42 days:     Pembrolizumab    Chemotherapy cycles 1 through 4: A cycle is every 21 days:     Paclitaxel      Carboplatin      Filgrastim-xxxx    Chemotherapy cycles 5 through 8: A cycle is every 21 days:     Doxorubicin      Cyclophosphamide      Pegfilgrastim-xxxx   **Always confirm dose/schedule in your pharmacy ordering system**  Patient Characteristics: Preoperative or Nonsurgical Candidate (Clinical Staging), Neoadjuvant Therapy followed by Surgery, Invasive Disease, Chemotherapy, HER2 Negative/Unknown/Equivocal, ER Negative/Unknown, Platinum Therapy Indicated, High-Risk Disease Present Therapeutic Status: Preoperative or Nonsurgical Candidate (Clinical Staging) AJCC M Category: cM0 AJCC Grade: G3 Breast Surgical Plan: Neoadjuvant Therapy followed by Surgery ER Status: Negative (-) AJCC 8 Stage Grouping: IIB HER2 Status: Negative (-) AJCC T Category: cT2 AJCC N Category: cN0 PR Status: Negative (-) Type of Therapy: Platinum Therapy Indicated Intent of Therapy: Curative Intent, Discussed with Patient

## 2020-09-29 NOTE — Progress Notes (Signed)
REFERRING PROVIDER: [name], MD [address] [place],  [place] [address]  PRIMARY PROVIDER:  Lucianne Lei, MD  PRIMARY REASON FOR VISIT:  1. Malignant neoplasm of upper-inner quadrant of right breast in female, estrogen receptor negative ([place])   2. Family history of breast cancer   3. Family history of prostate cancer      I connected with Ms. Moris on 09/29/2020 at 11:00 am EDT by video conference and verified that I am speaking with the correct person using two identifiers.   Patient location: [facility] Provider location: [place] Office  HISTORY OF PRESENT ILLNESS:   Ms. Gebhart, a 50 y.o. female, was seen for a [place] cancer genetics consultation in the Breast Multidisciplinary Clinic at the request of Dr. Jana Hakim due to a personal and family history of cancer. Ms. Monsour presents to clinic today to discuss the possibility of a hereditary predisposition to cancer, genetic testing, and to further clarify her future cancer risks, as well as potential cancer risks for family members.   Ms. Bunte has a history of triple negative right breast cancer in 2008 at the age of 15 or 67. This cancer was treated with a right lumpectomy, chemotherapy, and radiation therapy.   More recently, in January of 2022, she was diagnosed with triple negative invasive ductal carcinoma of the right breast. The treatment plan includes adjuvant chemotherapy and surgery.   CANCER HISTORY:  Oncology History  Malignant neoplasm of upper-inner quadrant of right breast in female, estrogen receptor negative ([place])  09/27/2020 Initial Diagnosis   Malignant neoplasm of upper-inner quadrant of right breast in female, estrogen receptor negative ([place])   10/14/2020 -  Chemotherapy    Patient is on Treatment Plan: BREAST CARBOPLATIN D1,8,15 + PACLITAXEL D1,8,15 Q28D X 6 CYCLES         RISK FACTORS:  Menarche was at age 43.  First live birth  at age 27.  OCP use for approximately 1 years.  Ovaries intact: yes.  Hysterectomy: no.  Menopausal status: premenopausal.  HRT use: 0 years. Colonoscopy: no; not examined. Mammogram within the last year: yes.   Past Medical History:  Diagnosis Date  . Breast cancer [facility]) 2008   Right Breast Cancer  . Cancer ([place])   . Family history of breast cancer   . Family history of prostate cancer   . Personal history of chemotherapy 2008   Right Breast Cancer  . Personal history of radiation therapy 2008   Right Breast Cancer    Past Surgical History:  Procedure Laterality Date  . BREAST LUMPECTOMY Right 2008  . CESAREAN SECTION      Social History   Socioeconomic History  . Marital status: Married    Spouse name: Not on file  . Number of children: Not on file  . Years of education: Not on file  . Highest education level: Not on file  Occupational History  . Not on file  Tobacco Use  . Smoking status: Never Smoker  . Smokeless tobacco: Never Used  Substance and Sexual Activity  . Alcohol use: No  . Drug use: No  . Sexual activity: Not on file  Other Topics Concern  . Not on file  Social History Narrative  . Not on file   Social Determinants of Health   Financial Resource Strain: Low Risk   . Difficulty of Paying Living Expenses: Not hard at all  Food Insecurity: Not on file  Transportation Needs: No  Transportation Needs  . Lack of Transportation (Medical): No  . Lack of Transportation (Non-Medical): No  Physical Activity: Not on file  Stress: Not on file  Social Connections: Not on file     FAMILY HISTORY:  We obtained a detailed, 4-generation family history.  Significant diagnoses are listed below: Family History  Problem Relation Age of Onset  . Breast cancer Maternal Grandmother 78  . Prostate cancer Maternal Grandfather        dx 67s, metastatic   Ms. Haman has one son (age 62) and two daughters (ages 62 and 30). She has two brothers and two  sisters (ages 64s-51). None of these family members have had cancer.  Ms. Capili mother is 45 and has not had cancer. She had three maternal aunts and two maternal uncles. There is no known cancer among maternal first cousins. Her maternal grandmother was diagnosed with breast cancer at age 63. Her maternal grandfather died from prostate cancer at age 57 (first diagnosed in his 27s).   Ms. Burchill's father died at age 35 without cancer. She has three paternal aunts and one paternal uncle. There is no known cancer among paternal cousins. Her paternal grandmother died in her 13s/40s. Her paternal grandfather died older than 36.   Ms. Ortner is unaware of previous family history of genetic testing for hereditary cancer risks. Patient's ancestors are of unknown descent. There is no reported Ashkenazi Jewish ancestry. There is no known consanguinity.  GENETIC COUNSELING ASSESSMENT: Ms. Ressler is a 50 y.o. female with a personal history of triple negative breast cancer and a family history of breast cancer and metastatic prostate cancer, which is somewhat suggestive of a hereditary cancer syndrome and predisposition to cancer. We, therefore, discussed and recommended the following at today's visit.   DISCUSSION: We discussed that approximately 5-10% of breast cancer is hereditary, with most cases associated with the BRCA1 and BRCA2 genes. There are other genes that can be associated with hereditary breast cancer syndromes. These include ATM, CHEK2, PALB2, etc. We discussed that testing is beneficial for several reasons, including knowing about other cancer risks, identifying potential screening and risk-reduction options that may be appropriate, and to understand if other family members could be at risk for cancer and allow them to undergo genetic testing.  We reviewed the characteristics, features and inheritance patterns of hereditary cancer syndromes. We also discussed genetic testing, including  the appropriate family members to test, the process of testing, insurance coverage and turn-around-time for results. We discussed the implications of a negative, positive and/or variant of uncertain significant result. We recommended Ms. Isaacks pursue genetic testing for the [facility] Common Hereditary Cancers panel.   The Common Hereditary Cancers Panel offered by [facility] includes sequencing and/or deletion duplication testing of the following 48 genes: APC, ATM, AXIN2, BARD1, BMPR1A, BRCA1, BRCA2, BRIP1, CDH1, CDK4, CDKN2A (p14ARF), CDKN2A (p16INK4a), CHEK2, CTNNA1, DICER1, EPCAM (Deletion/duplication testing only), GREM1 (promoter region deletion/duplication testing only), KIT, MEN1, MLH1, MSH2, MSH3, MSH6, MUTYH, NBN, NF1, NTHL1, PALB2, PDGFRA, PMS2, POLD1, POLE, PTEN, RAD50, RAD51C, RAD51D, RNF43, SDHB, SDHC, SDHD, SMAD4, SMARCA4. STK11, TP53, TSC1, TSC2, and VHL. The following genes are evaluated for sequence changes only: SDHA and HOXB13 c.251G>A variant only.   Based on Ms. Vernet's personal and family history of cancer, she meets medical criteria for genetic testing. Despite that she meets criteria, there may still be an out of pocket cost.  PLAN: After considering the risks, benefits, and limitations, Ms. Antonacci provided informed consent to pursue genetic testing  and the blood sample was sent to [facility] for analysis of the Common Hereditary Cancers panel. Results should be available within approximately two-three weeks' time, at which point they will be disclosed by telephone to Ms. Shippee, as will any additional recommendations warranted by these results. Ms. Garmany will receive a summary of her genetic counseling visit and a copy of her results once available. This information will also be available in Epic.   Ms. Olthoff questions were answered to her satisfaction today. Our contact information was provided should additional questions or concerns arise. Thank you  for the referral and allowing [place] to share in the care of your patient.   Clint Guy, [place], [facility] Licensed, Certified Dispensing [email or web address] Phone: [phone]  The patient was seen for a total of 20 minutes in face-to-face genetic counseling.  This patient was discussed with Drs. Magrinat, Lindi Adie and/or Burr Medico who agrees with the above.    _______________________________________________________________________ For Office Staff:  Number of people involved in session: 1 Was an Intern/ student involved with case: no

## 2020-09-29 NOTE — Progress Notes (Signed)
Radiation Oncology         ([phone]) [phone] ________________________________  Name: Penny Brown        MRN: [id number]  Date of Service: 09/29/2020 DOB: 1971/06/30  WI:OXBDZ, Myra Rude, MD  [name], MD     REFERRING PHYSICIAN: [name], MD   DIAGNOSIS: The encounter diagnosis was Malignant neoplasm of upper-inner quadrant of right breast in female, estrogen receptor negative ([place]).   HISTORY OF PRESENT ILLNESS: Penny Brown is a 50 y.o. female seen in the multidisciplinary breast clinic for a new diagnosis of rightbreast cancer. The patient was noted to have a history of right breast cancer for which she underwent right lumpectomy, adjuvant radiotherapy and chemotherapy. She recently palpated a mass in the right breast and diagnostic imaging revealed a 2.2 cm mass in the upper medial breast. An ultrasound measured the lesion at 2.2 cm at 1:00 and her axilla was negative for adenopathy. 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 She's seen today to discuss treatment recommendations for her cancer.   PREVIOUS RADIATION THERAPY: Yes   2008:  The right breast was treated with adjuvant radiation though details are unknown at this time.   PAST MEDICAL HISTORY:  Past Medical History:  Diagnosis Date  . Breast cancer [facility] [place] [facility]) 2008   Right Breast Cancer  . Cancer ([place])   . Personal history of chemotherapy 2008   Right Breast Cancer  . Personal history of radiation therapy 2008   Right Breast Cancer       PAST SURGICAL HISTORY: Past Surgical History:  Procedure Laterality Date  . BREAST LUMPECTOMY Right 2008  . CESAREAN SECTION       FAMILY HISTORY: No family history on file.   SOCIAL HISTORY:  reports that she has never smoked. She has never used smokeless tobacco. She reports that she does not drink alcohol and does not use drugs. The patient is married and lives in  [place]. She works for [employer].    ALLERGIES: Patient has no known allergies.   MEDICATIONS:  Current Outpatient Medications  Medication Sig Dispense Refill  . cephALEXin (KEFLEX) 500 MG capsule Take 1 capsule (500 mg total) by mouth 4 (four) times daily. 20 capsule 0  . fluconazole (DIFLUCAN) 200 MG tablet Take one tablet today, wait 3 days, take the second tablet 2 tablet 0  . phenazopyridine (PYRIDIUM) 200 MG tablet Take 1 tablet (200 mg total) by mouth 3 (three) times daily as needed for pain. 10 tablet 0   No current facility-administered medications for this encounter.     REVIEW OF SYSTEMS: On review of systems, the patient reports that she is doing well overall without any specific concerns verbalized.     PHYSICAL EXAM:  Wt Readings from Last 3 Encounters:  No data found for Wt   Temp Readings from Last 3 Encounters:  03/13/17 98.6 F (37 C) (Oral)  07/09/16 98.4 F (36.9 C)   BP Readings from Last 3 Encounters:  03/13/17 (!) 140/94  07/09/16 125/80   Pulse Readings from Last 3 Encounters:  03/13/17 87  07/09/16 101    In general this is a well appearing African American female in no acute distress. She's alert and oriented x4 and appropriate throughout the examination. Cardiopulmonary assessment is negative for acute distress and she exhibits normal effort. Bilateral breast exam is deferred.    ECOG = 1  0 -  Asymptomatic (Fully active, able to carry on all predisease activities without restriction)  1 - Symptomatic but completely ambulatory (Restricted in physically strenuous activity but ambulatory and able to carry out work of a light or sedentary nature. For example, light housework, office work)  2 - Symptomatic, <50% in bed during the day (Ambulatory and capable of all self care but unable to carry out any work activities. Up and about more than 50% of waking hours)  3 - Symptomatic, >50% in bed, but not bedbound (Capable of only  limited self-care, confined to bed or chair 50% or more of waking hours)  4 - Bedbound (Completely disabled. Cannot carry on any self-care. Totally confined to bed or chair)  5 - Death   Eustace Pen MM, Creech RH, Tormey DC, et al. [phone]). "Toxicity and response criteria of the [facility]". [place] Oncol. 5 (6): 649-55    LABORATORY DATA:  Lab Results  Component Value Date   WBC 5.2 09/14/2009   HGB 13.3 09/14/2009   HCT 40.2 09/14/2009   MCV 91.2 09/14/2009   PLT 213 09/14/2009   Lab Results  Component Value Date   NA 139 09/14/2009   K 3.8 09/14/2009   CL 106 09/14/2009   CO2 27 09/14/2009   Lab Results  Component Value Date   ALT 18 09/14/2009   AST 21 09/14/2009   ALKPHOS 54 09/14/2009   BILITOT 0.6 09/14/2009      RADIOGRAPHY: No results found.     IMPRESSION/PLAN: 1. 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 Dr. Lisbeth Renshaw discusses the pathology findings and reviews the nature of breast disease. The consensus from the breast conference includes proceeding with mastectomy with sentinel node biopsy followed by adjuvant chemotherapy. With her prior history of radiotherapy, Dr. Lisbeth Renshaw discusses that most clinical practice patterns where patient's have had prior treatment would not support a role for additional radiation treatment. However, there are some trials that have shown safety and efficacy in scenarios where patients are re-treated. The patient is interested in possibly considering lumpectomy again and the option of reirradiation. Dr. Lisbeth Renshaw spent time discussing these options but would like to have more concrete information about the size of her cancer by her preneoadjuvant MRI and could meet back prior to surgical planning to discuss these options again. He reviews the risks of re-irradiation, the delivery and logistics of treatment, and  if we proceed would likely need to give treatment over 6 1/2 weeks. The patient is in agreement and we will plan to see her back prior to considering surgery, but several cycles of chemotherapy from now to see how clinically she's doing and how she's considering her options at that time.   In a visit lasting 60 minutes, greater than 50% of the time was spent face to face reviewing her case, as well as in preparation of, discussing, and coordinating the patient's care.  The above documentation reflects my direct findings during this shared patient visit. Please see the separate note by Dr. Lisbeth Renshaw on this date for the remainder of the patient's plan of care.    Carola Rhine, PAC

## 2020-09-29 NOTE — Progress Notes (Signed)
[facility]  Initial Assessment   Penny Brown is a 50 y.o. year old female accompanied by patient and husband [name]). Clinical Social Work was referred by [facility] for assessment of psychosocial needs.   SDOH (Social Determinants of Health) assessments performed: Yes SDOH Interventions   Flowsheet Row Most Recent Value  SDOH Interventions   Financial Strain Interventions Intervention Not Indicated  Housing Interventions Intervention Not Indicated  Transportation Interventions Intervention Not Indicated      Distress Screen completed: Yes ONCBCN DISTRESS SCREENING 09/29/2020  Screening Type Initial Screening  Distress experienced in past week (1-10) 1  Emotional problem type Adjusting to illness    Family/Social Information:  . Housing Arrangement: patient lives with husband, and 31yo & 15yo daughters. 44 yo son in [place] with his family . Family members/support persons in your life? Family (husband, children, mother in Sports coach, sisters), Friends and Church . Transportation concerns: no  . Employment: Working full time for [employer]. Should have access to short-term disability. Income source: Employment . Financial concerns: No o Type of concern: None . Food access concerns: no . Religious or spiritual practice: yes, very spiritual family which is a source of strength for them . Medication Concerns: no  . Services Currently in place:  n/a  Coping/ Adjustment to diagnosis: . Patient understands treatment plan and what happens next? Yes. Had previous treatment for breast cancer in 2008 so is drawing from that experience, her faith, and her family to stay strong and positive . Concerns about diagnosis and/or treatment: General adjustment to diagnosis . Patient reported stressors: Adjusting to my illness . Hopes and priorities: Hopes to be able to be treated and cured. Hopes to set example to younger children to be able to cope . Current coping skills/ strengths: Capable  of independent living, Communication skills, Scientist, research (life sciences), Motivation for treatment/growth, Religious Affiliation and Supportive family/friends    SUMMARY: Current SDOH Barriers:  . No significant SDOH barriers noted today  Clinical Social Work Clinical Goal(s):  Marland Kitchen No clinical social work goals at this time  Interventions: . Discussed common feeling and emotions when being diagnosed with cancer, and the importance of support during treatment . Informed patient of the support team roles and support services at [facility] . Provided CSW contact information and encouraged patient to call with any questions or concerns   Follow Up Plan: Patient will contact CSW with any support or resource needs Patient verbalizes understanding of plan: Yes    Christeen Douglas , LCSW

## 2020-09-29 NOTE — Patient Instructions (Signed)

## 2020-09-29 NOTE — Therapy (Signed)
[place], [place], [address] Phone: [phone]   Fax:  [phone]  Physical Therapy Evaluation  Patient Details  Name: Penny Brown MRN: [id number] Date of Birth: [birth year] Referring Provider (PT): Dr. [name]   Encounter Date: 09/29/2020   PT End of Session - 09/29/20 1025    Visit Number 1    Number of Visits 2    Date for PT Re-Evaluation 03/29/21    PT Start Time 0916    PT Stop Time 0936   Also saw pt from 1025 to 1037 and from 1130 to 1135 for a total of 37 minutes   PT Time Calculation (min) 20 min    Activity Tolerance Patient tolerated treatment well    Behavior During Therapy [facility] for tasks assessed/performed           Past Medical History:  Diagnosis Date  . Breast cancer [facility]) 2008   Right Breast Cancer  . Cancer ([place])   . Personal history of chemotherapy 2008   Right Breast Cancer  . Personal history of radiation therapy 2008   Right Breast Cancer    Past Surgical History:  Procedure Laterality Date  . BREAST LUMPECTOMY Right 2008  . CESAREAN SECTION      There were no vitals filed for this visit.    Subjective Assessment - 09/29/20 0956    Subjective Patient reports she is here today to be seen by her medical team for her newly diagnosed right breast cancer.    Patient is accompained by: Family member    Pertinent History Patient was diagnosed on 09/22/2020 with right triple negative breast cancer. It measures 2.2 cm and is located in the upper inner quadrant with a Ki67 of 80%. She had previous right breast cancer in 2008 with a lumpectomy, sentinel node biopsy (pt believes they took 4 lymph nodes and 1 was positive), chemotherapy and radiation.    Patient Stated Goals Reduce lymphedema risk and learn post op shoulder ROM HEP    Currently in Pain? Yes    Pain Score --   varies   Pain Location Breast    Pain Orientation Right    Pain Descriptors /  Indicators Sore    Pain Type Acute pain    Pain Onset 1 to 4 weeks ago    Pain Frequency Intermittent    Aggravating Factors  [place] on the area              [facility] PT Assessment - 09/29/20 0001      Assessment   Medical Diagnosis Right breast cancer    Referring Provider (PT) Dr. [name]    Onset Date/Surgical Date 09/22/20    Hand Dominance Right    Prior Therapy None      Precautions   Precautions Other (comment)    Precaution Comments active cancer      Restrictions   Weight Bearing Restrictions No      Balance Screen   Has the patient fallen in the past 6 months No    Has the patient had a decrease in activity level because of a fear of falling?  No    Is the patient reluctant to leave their home because of a fear of falling?  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      Cognition   Overall Cognitive Status Within Functional Limits for tasks assessed      Posture/Postural Control   Posture/Postural Control Postural limitations    Postural Limitations Rounded Shoulders;Forward head      ROM / Strength   AROM / PROM / Strength Strength;AROM      AROM   AROM Assessment Site Shoulder    Right/Left Shoulder Right;Left    Right Shoulder Extension 39 Degrees    Right Shoulder Flexion 151 Degrees    Right Shoulder ABduction 156 Degrees    Right Shoulder Internal Rotation 68 Degrees    Right Shoulder External Rotation 76 Degrees    Left Shoulder Extension 42 Degrees    Left Shoulder Flexion 159 Degrees    Left Shoulder ABduction 153 Degrees    Left Shoulder Internal Rotation 65 Degrees    Left Shoulder External Rotation 80 Degrees      Strength   Overall  Strength Within functional limits for tasks performed             LYMPHEDEMA/ONCOLOGY QUESTIONNAIRE - 09/29/20 0001      Type   Cancer Type Right breast cancer      Lymphedema Assessments   Lymphedema Assessments Upper extremities      Right Upper Extremity Lymphedema   10 cm Proximal to Olecranon Process 30.5 cm    Olecranon Process 24.6 cm    10 cm Proximal to Ulnar Styloid Process 24.4 cm    Just Proximal to Ulnar Styloid Process 15.8 cm    Across Hand at [employer] 19 cm    At [place] of 2nd Digit 5.8 cm      Left Upper Extremity Lymphedema   10 cm Proximal to Olecranon Process 29.1 cm    Olecranon Process 24 cm    10 cm Proximal to Ulnar Styloid Process 23.2 cm    Just Proximal to Ulnar Styloid Process 15.2 cm    Across Hand at [employer] 19.1 cm    At [place] of 2nd Digit 5.7 cm           L-DEX FLOWSHEETS - 09/29/20 1000      L-DEX LYMPHEDEMA SCREENING   Measurement Type Unilateral    L-DEX MEASUREMENT EXTREMITY Upper Extremity    POSITION  Standing    DOMINANT SIDE Right    At Risk Side Right    BASELINE SCORE (UNILATERAL) 3.5   Pt already at risk for lymphedema in right arm          The patient was assessed using the L-Dex machine today to produce a lymphedema index baseline score. The patient will be reassessed on a regular basis (typically every 3 months) to obtain new L-Dex scores. If the score is > 6.5 points away from his/her baseline score indicating onset of subclinical lymphedema, it will be recommended to wear a compression garment for 4 weeks, 12 hours per day and then be reassessed. If the score continues to be > 6.5 points from baseline at reassessment, we will initiate lymphedema treatment. Assessing in this manner has a 95% rate of preventing clinically significant lymphedema.      Katina Dung - 09/29/20 0001    Open a tight or new jar No difficulty    Do heavy household chores (wash walls, wash floors) No  difficulty    Carry a shopping  bag or briefcase No difficulty    Wash your back No difficulty    Use a knife to cut food No difficulty    Recreational activities in which you take some force or impact through your arm, shoulder, or hand (golf, hammering, tennis) No difficulty    During the past week, to what extent has your arm, shoulder or hand problem interfered with your normal social activities with family, friends, neighbors, or groups? Not at all    During the past week, to what extent has your arm, shoulder or hand problem limited your work or other regular daily activities Not at all    Arm, shoulder, or hand pain. None    Tingling (pins and needles) in your arm, shoulder, or hand None    Difficulty Sleeping No difficulty    DASH Score 0 %            Objective measurements completed on examination: See above findings.        Patient was instructed today in a home exercise program today for post op shoulder range of motion. These included active assist shoulder flexion in sitting, scapular retraction, wall walking with shoulder abduction, and hands behind head external rotation.  She was encouraged to do these twice a day, holding 3 seconds and repeating 5 times when permitted by her physician.           PT Education - 09/29/20 1024    Education Details Lymphedema risk reduction and post op shoulder ROM HEP    Person(s) Educated Patient;Spouse    Methods Explanation;Demonstration;Handout    Comprehension Returned demonstration;Verbalized understanding               PT Long Term Goals - 09/29/20 1039      PT LONG TERM GOAL #1   Title Patient will demonstrate she has regained full shoulder ROM and function post operatively compared to baselines.    Time 6    Period Months    Status New    Target Date 03/29/21           Breast Clinic Goals - 09/29/20 1039      Patient will be able to verbalize understanding of pertinent lymphedema risk reduction practices relevant to her diagnosis  specifically related to skin care.   Time 1    Period Days    Status Achieved      Patient will be able to return demonstrate and/or verbalize understanding of the post-op home exercise program related to regaining shoulder range of motion.   Time 1    Period Days    Status Achieved      Patient will be able to verbalize understanding of the importance of attending the postoperative After Breast Cancer Class for further lymphedema risk reduction education and therapeutic exercise.   Time 1    Period Days    Status Achieved                 Plan - 09/29/20 1027    Clinical Impression Statement Patient was diagnosed on 09/22/2020 with right triple negative breast cancer. It measures 2.2 cm and is located in the upper inner quadrant with a Ki67 of 80%. She had previous right breast cancer in 2008 with a lumpectomy, sentinel node biopsy (pt believes they took 4 lymph nodes and 1 was positive), chemotherapy and radiation. Her multidisciplinary medical team met prior to her assessments to determine a recommended treatment plan.  She is planning to have neoadjuvant chemotherapy followed by either a right mastectomy or lumpectomy and sentinel node biopsy. She will benefit from a post op PT reassessment and from L-Dex screens every 3 months for 2 years to detect subclinical lymphedema.    Stability/Clinical Decision Making Stable/Uncomplicated    Clinical Decision Making Low    Rehab Potential Excellent    PT Frequency --   Eval and 1 f/u visit   PT Treatment/Interventions ADLs/Self Care Home Management;Therapeutic exercise;Patient/family education    PT Next Visit Plan Will reassess 3-4 weeks post op    PT Home Exercise Plan Post op shoulder ROM HEP    Consulted and Agree with Plan of Care Patient;Family member/caregiver    Family Member Consulted husband          Patient will follow up at outpatient cancer rehab 3-4 weeks following surgery.  If the patient requires physical therapy at  that time, a specific plan will be dictated and sent to the referring physician for approval. The patient was educated today on appropriate basic range of motion exercises to begin post operatively and the importance of attending the After Breast Cancer class following surgery.  Patient was educated today on lymphedema risk reduction practices as it pertains to recommendations that will benefit the patient immediately following surgery.  She verbalized good understanding.     Patient will benefit from skilled therapeutic intervention in order to improve the following deficits and impairments:  Postural dysfunction,Decreased knowledge of precautions,Impaired UE functional use,Pain,Decreased range of motion  Visit Diagnosis: Malignant neoplasm of upper-inner quadrant of right breast in female, estrogen receptor negative ([place]) - Plan: PT plan of care cert/re-cert  Abnormal posture - Plan: PT plan of care cert/re-cert     Problem List Patient Active Problem List   Diagnosis Date Noted  . Malignant neoplasm of upper-inner quadrant of right breast in female, estrogen receptor negative ([place]) 09/27/2020   Annia Friendly, PT 09/29/20 11:59 AM  [place], [place], [address] Phone: [phone]   Fax:  [phone]  Name: Penny Brown MRN: [id number] Date of Birth: [birth year]

## 2020-09-30 ENCOUNTER — Other Ambulatory Visit: Payer: Self-pay | Admitting: *Deleted

## 2020-09-30 DIAGNOSIS — Z171 Estrogen receptor negative status [ER-]: Secondary | ICD-10-CM

## 2020-09-30 DIAGNOSIS — C50211 Malignant neoplasm of upper-inner quadrant of right female breast: Secondary | ICD-10-CM

## 2020-09-30 MED ORDER — LIDOCAINE-PRILOCAINE 2.5-2.5 % EX CREA: TOPICAL_CREAM | CUTANEOUS | 3 refills | Status: DC

## 2020-09-30 MED ORDER — PROCHLORPERAZINE MALEATE 10 MG PO TABS: 10.0000 mg | ORAL_TABLET | Freq: Four times a day (QID) | ORAL | 1 refills | Status: DC | PRN

## 2020-10-04 ENCOUNTER — Other Ambulatory Visit: Payer: Self-pay | Admitting: General Surgery

## 2020-10-04 ENCOUNTER — Telehealth: Payer: Self-pay | Admitting: Oncology

## 2020-10-04 NOTE — Telephone Encounter (Signed)
Informed patient of her upcoming appointment. Patient is aware but stated she hasn't had a port for 13 years.

## 2020-10-05 ENCOUNTER — Other Ambulatory Visit: Payer: Self-pay | Admitting: Oncology

## 2020-10-06 ENCOUNTER — Other Ambulatory Visit: Payer: 59

## 2020-10-07 ENCOUNTER — Telehealth: Payer: Self-pay | Admitting: *Deleted

## 2020-10-07 ENCOUNTER — Encounter: Payer: Self-pay | Admitting: *Deleted

## 2020-10-07 ENCOUNTER — Other Ambulatory Visit: Payer: Self-pay

## 2020-10-07 ENCOUNTER — Encounter (HOSPITAL_BASED_OUTPATIENT_CLINIC_OR_DEPARTMENT_OTHER): Payer: Self-pay | Admitting: General Surgery

## 2020-10-07 NOTE — Progress Notes (Signed)
Pharmacist Chemotherapy Monitoring - Initial Assessment    Anticipated start date: 10/14/20   Regimen:  . Are orders appropriate based on the patient's diagnosis, regimen, and cycle? Yes . Does the plan date match the patient's scheduled date? Yes . Is the sequencing of drugs appropriate? Yes . Are the premedications appropriate for the patient's regimen? Yes . Prior Authorization for treatment is: Not Started o If applicable, is the correct biosimilar selected based on the patient's insurance? not applicable  Organ Function and Labs: Marland Kitchen Are dose adjustments needed based on the patient's renal function, hepatic function, or hematologic function? No . Are appropriate labs ordered prior to the start of patient's treatment? Yes . Other organ system assessment, if indicated: women of childbearing potential: pregnancy status  . The following baseline labs, if indicated, have been ordered: pembrolizumab: baseline TSH +/- T4  Dose Assessment: . Are the drug doses appropriate? Yes . Are the following correct: o Drug concentrations Yes o IV fluid compatible with drug Yes o Administration routes Yes o Timing of therapy Yes . If applicable, does the patient have documented access for treatment and/or plans for port-a-cath placement? yes . If applicable, have lifetime cumulative doses been properly documented and assessed? yes Lifetime Dose Tracking  No doses have been documented on this patient for the following tracked chemicals: Doxorubicin, Epirubicin, Idarubicin, Daunorubicin, Mitoxantrone, Bleomycin, Oxaliplatin, Carboplatin, Liposomal Doxorubicin  o Pt had 4 cycles of Adriamycin in 2008 = 240 mg/m2  Toxicity Monitoring/Prevention: . The patient has the following take home antiemetics prescribed: Prochlorperazine . The patient has the following take home medications prescribed: N/A . Medication allergies and previous infusion related reactions, if applicable, have been reviewed and  addressed. Yes . The patient's current medication list has been assessed for drug-drug interactions with their chemotherapy regimen. no significant drug-drug interactions were identified on review.  Order Review: . Are the treatment plan orders signed? No . Is the patient scheduled to see a provider prior to their treatment? Yes  I verify that I have reviewed each item in the above checklist and answered each question accordingly.  Kennith Center, Pharm.D., CPP 10/07/2020@10 :17 AM

## 2020-10-07 NOTE — Telephone Encounter (Signed)
Left message for a return phone call to follow up from [facility] 1/26.

## 2020-10-08 ENCOUNTER — Other Ambulatory Visit: Payer: Self-pay

## 2020-10-08 ENCOUNTER — Ambulatory Visit (HOSPITAL_COMMUNITY)
Admission: RE | Admit: 2020-10-08 | Discharge: 2020-10-08 | Disposition: A | Discharge status: Home / Self Care | Payer: 59 | Source: Ambulatory Visit | Attending: Oncology | Admitting: Oncology

## 2020-10-08 ENCOUNTER — Inpatient Hospital Stay: Payer: 59 | Attending: Oncology

## 2020-10-08 DIAGNOSIS — Z171 Estrogen receptor negative status [ER-]: Secondary | ICD-10-CM | POA: Insufficient documentation

## 2020-10-08 DIAGNOSIS — G629 Polyneuropathy, unspecified: Secondary | ICD-10-CM | POA: Insufficient documentation

## 2020-10-08 DIAGNOSIS — C50211 Malignant neoplasm of upper-inner quadrant of right female breast: Secondary | ICD-10-CM | POA: Insufficient documentation

## 2020-10-08 DIAGNOSIS — K5900 Constipation, unspecified: Secondary | ICD-10-CM | POA: Insufficient documentation

## 2020-10-08 DIAGNOSIS — Z5111 Encounter for antineoplastic chemotherapy: Secondary | ICD-10-CM | POA: Insufficient documentation

## 2020-10-08 DIAGNOSIS — Z803 Family history of malignant neoplasm of breast: Secondary | ICD-10-CM | POA: Insufficient documentation

## 2020-10-08 DIAGNOSIS — Z923 Personal history of irradiation: Secondary | ICD-10-CM | POA: Insufficient documentation

## 2020-10-08 DIAGNOSIS — Z79899 Other long term (current) drug therapy: Secondary | ICD-10-CM | POA: Insufficient documentation

## 2020-10-08 DIAGNOSIS — Z8042 Family history of malignant neoplasm of prostate: Secondary | ICD-10-CM | POA: Insufficient documentation

## 2020-10-08 MED ORDER — GADOBUTROL 1 MMOL/ML IV SOLN
7.0000 mL | Freq: Once | INTRAVENOUS | Status: AC | PRN
  Administered 2020-10-08: 7 mL via INTRAVENOUS

## 2020-10-09 ENCOUNTER — Other Ambulatory Visit (HOSPITAL_COMMUNITY)
Admission: RE | Admit: 2020-10-09 | Discharge: 2020-10-09 | Disposition: A | Discharge status: Home / Self Care | Payer: 59 | Source: Ambulatory Visit | Attending: General Surgery | Admitting: General Surgery

## 2020-10-09 DIAGNOSIS — Z01812 Encounter for preprocedural laboratory examination: Secondary | ICD-10-CM | POA: Insufficient documentation

## 2020-10-09 DIAGNOSIS — Z20822 Contact with and (suspected) exposure to covid-19: Secondary | ICD-10-CM | POA: Diagnosis not present

## 2020-10-09 LAB — SARS CORONAVIRUS 2 (TAT 6-24 HRS): SARS Coronavirus 2: NEGATIVE

## 2020-10-11 ENCOUNTER — Other Ambulatory Visit: Payer: Self-pay | Admitting: Oncology

## 2020-10-11 ENCOUNTER — Encounter: Payer: Self-pay | Admitting: *Deleted

## 2020-10-11 NOTE — Progress Notes (Unsigned)
[place]  [phone] [phone]     ID: Penny Brown DOB: [birth year]  MR#: [id number]  LPF#:[id number]  Patient Care Team: Penny Hawks, NP as PCP - General (Family Medicine) Penny Kaufmann, RN as Oncology Nurse Navigator Penny Germany, RN as Oncology Nurse Navigator [facility], Penny Dad, MD as Consulting Physician (Oncology) Penny Bookbinder, MD as Consulting Physician (General Surgery) Penny Rudd, MD as Consulting Physician (Radiation Oncology) Penny Cruel, MD OTHER MD:  CHIEF COMPLAINT: A second triple negative breast cancer  CURRENT TREATMENT: neoadjuvant chemotherapy   HISTORY OF CURRENT ILLNESS: Penny Brown has a history of 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 She received 4 cycles of doxorubicin and cyclophosphamide followed by 4 cycles of of paclitaxel, which was discontinued secondary to neuropathy. She was released from follow up here in 2012.  More recently, she presented with a palpable upper-inner right breast mass. She underwent bilateral diagnostic mammography with tomography and right breast ultrasonography at [facility] on 09/22/2020 showing: breast density category A; 2.2 cm irregular mass in upper-inner right breast; no significant abnormalities in right axilla.  Accordingly on 09/22/2020 she proceeded to biopsy of the right breast area in question. The pathology from this procedure ([id number]) showed: invasive ductal carcinoma, grade 3. Prognostic indicators significant for: estrogen receptor, 0% negative and progesterone receptor, 0% negative. Proliferation marker Ki67 at 80%. HER2 negatve by immunohistochemistry (1+).  Cancer Staging Malignant neoplasm of upper-inner quadrant of right breast in female, estrogen receptor negative ([place]) Staging form: Breast, AJCC 8th Edition - Clinical stage from 09/29/2020: Stage  IIB (rcT2, cN0, cM0, G3, ER-, PR-, HER2-) - Signed by Penny Cruel, MD on 09/29/2020 Stage prefix: Recurrence  Malignant neoplasm of upper-outer quadrant of right breast in female, estrogen receptor negative ([place]) Staging form: Breast, AJCC 8th Edition - Clinical: cT2, cN1, cM0, ER-, PR-, HER2- - Signed by Penny Cruel, MD on 09/29/2020 Stage prefix: Initial diagnosis Nuclear grade: G3   The patient's subsequent history is as detailed below.   INTERVAL HISTORY: Penny Brown was evaluated in the multidisciplinary breast cancer clinic on 09/29/2020 accompanied by her husband can. Her case was also presented at the multidisciplinary breast cancer conference on the same day. At that time a preliminary plan was proposed: Neoadjuvant chemotherapy, surgery genetics testing   REVIEW OF SYSTEMS: On the provided questionnaire, Penny Brown reports wearing glasses, change in stool habits, and palpable breast lump with associated pain. The patient denies unusual headaches, visual changes, nausea, vomiting, stiff neck, dizziness, or gait imbalance. There has been no cough, phlegm production, or pleurisy, no chest pain or pressure, and no change in bladder habits. The patient denies fever, rash, bleeding, unexplained fatigue or unexplained weight loss. A detailed review of systems was otherwise entirely negative.   COVID 19 VACCINATION STATUS: fully vaccinated [employer]), with booster 06/2020   PAST MEDICAL HISTORY: Past Medical History:  Diagnosis Date  . Breast cancer [facility]) 2008   Right Breast Cancer  . Cancer ([place])   . Family history of breast cancer   . Family history of prostate cancer   . Personal history of chemotherapy 2008   Right Breast Cancer  . Personal history of radiation therapy 2008   Right Breast Cancer    PAST SURGICAL HISTORY: Past Surgical History:  Procedure Laterality Date  . BREAST LUMPECTOMY Right 2008  . CESAREAN SECTION      FAMILY HISTORY: Family History  Problem Relation Age of Onset  . Breast cancer Maternal Grandmother 78  . Prostate cancer Maternal Grandfather        dx 54s, metastatic    Her father died from heart attack (possibly Covid?) at age 80. Her mother is age 96 as of 09/2020. Annalynne as 2 brothers and 2 sisters. She reports breast cancer in her maternal grandmother in her late 19's.   GYNECOLOGIC HISTORY:  No LMP recorded. Menarche: 50 years old Age at first live birth: 50 years old [place] P 3 LMP 09/09/2020, periods are regular, 7 days with 4 heavy Contraceptive: used for about 1 year HRT n/a  Hysterectomy? no BSO? 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 Husband Penny Brown is a Cabin crew. She lives at home with Penny Brown, daughter Penny Brown, age 16, and daughter Penny Brown, age 84 who works as a Curator. Son Penny Brown, age 64, is a Psychiatric nurse in [employer]. Penny Brown has one grandchild. She attends [employer].    ADVANCED DIRECTIVES: In the absence of any documentation to the contrary, the patient's spouse is their HCPOA.    HEALTH MAINTENANCE: Social History   Tobacco Use  . Smoking status: Never Smoker  . Smokeless tobacco: Never Used  Substance Use Topics  . Alcohol use: No  . Drug use: No     Colonoscopy: n/a (age)   PAP: 2019  Bone density: n/a (age)   No Known Allergies  Current Outpatient Medications  Medication Sig Dispense Refill  . lidocaine-prilocaine (EMLA) cream Apply to affected area once 30 g 3  . prochlorperazine (COMPAZINE) 10 MG tablet Take 1 tablet (10 mg total) by mouth every 6 (six) hours as needed (Nausea or vomiting). 30 tablet 1   No current facility-administered medications for this visit.    OBJECTIVE: African-American woman who appears younger than stated age  There were no vitals filed for this visit.   There is no height or weight on file to calculate BMI.   Wt Readings from Last 3  Encounters:  09/29/20 170 lb 1.6 oz (77.2 kg)      ECOG FS:1 - Symptomatic but completely ambulatory  Ocular: Sclerae unicteric, pupils round and equal Ear-nose-throat: Wearing a mask Lymphatic: No cervical or supraclavicular adenopathy Lungs no rales or rhonchi Heart regular rate and rhythm Abd soft, nontender, positive bowel sounds MSK no focal spinal tenderness, no joint edema Neuro: non-focal, well-oriented, appropriate affect Breasts: The right breast is status post prior lumpectomy and radiation.  Currently there is a new palpable mass in the upper inner quadrant which by palpation measures about 2 cm.  It is movable.  It does not involve the skin or nipple.  I do not palpate any mass in the left breast or either axilla   LAB RESULTS:  CMP     Component Value Date/Time   NA 140 09/29/2020 0830   K 3.8 09/29/2020 0830   CL 109 09/29/2020 0830   CO2 23 09/29/2020 0830   GLUCOSE 84 09/29/2020 0830   BUN 9 09/29/2020 0830   CREATININE 0.75 09/29/2020 0830   CALCIUM 8.9 09/29/2020 0830   PROT 7.3 09/29/2020 0830   ALBUMIN 3.6 09/29/2020 0830   AST 14 (L) 09/29/2020 0830   ALT 12 09/29/2020 0830   ALKPHOS 72 09/29/2020 0830   BILITOT 0.3 09/29/2020 0830   GFRNONAA >60 09/29/2020 0830   GFRAA  04/11/2007 1225    >  60        The eGFR has been calculated using the MDRD equation. This calculation has not been validated in all clinical    No results found for: TOTALPROTELP, ALBUMINELP, A1GS, A2GS, BETS, BETA2SER, GAMS, MSPIKE, SPEI  Lab Results  Component Value Date   WBC 6.7 09/29/2020   NEUTROABS 4.3 09/29/2020   HGB 11.6 (L) 09/29/2020   HCT 36.6 09/29/2020   MCV 88.0 09/29/2020   PLT 253 09/29/2020    Lab Results  Component Value Date   LABCA2 17 11/25/2007    No components found for: BTDHRC163  No results for input(s): INR in the last 168 hours.  Lab Results  Component Value Date   LABCA2 17 11/25/2007    No results found for: AGT364  No results  found for: WOE321  No results found for: YYQ825  No results found for: CA2729  No components found for: HGQUANT  No results found for: CEA1 / No results found for: CEA1   No results found for: AFPTUMOR  No results found for: CHROMOGRNA  No results found for: KPAFRELGTCHN, LAMBDASER, KAPLAMBRATIO (kappa/lambda light chains)  No results found for: HGBA, HGBA2QUANT, HGBFQUANT, HGBSQUAN (Hemoglobinopathy evaluation)   Lab Results  Component Value Date   LDH 156 11/25/2007    No results found for: IRON, TIBC, IRONPCTSAT (Iron and TIBC)  No results found for: FERRITIN  Urinalysis    Component Value Date/Time   LABSPEC 1.015 03/13/2017 1905   PHURINE 7.0 03/13/2017 1905   GLUCOSEU NEGATIVE 03/13/2017 1905   HGBUR MODERATE (A) 03/13/2017 1905   BILIRUBINUR NEGATIVE 03/13/2017 [place] 03/13/2017 1905   PROTEINUR NEGATIVE 03/13/2017 1905   UROBILINOGEN 0.2 03/13/2017 1905   NITRITE NEGATIVE 03/13/2017 1905   LEUKOCYTESUR MODERATE (A) 03/13/2017 1905     STUDIES: No results found.   ELIGIBLE FOR AVAILABLE RESEARCH PROTOCOL: no  ASSESSMENT: 50 y.o. [place] woman  (1) status post right breast upper outer quadrant lumpectomy and sentinel lymph node sampling 04/15/2007 for a pT2 pN1, stage IIIB invasive ductal carcinoma, grade 3, triple negative, with an MIB-1 of 59%  (a) adjuvant chemotherapy consisted of doxorubicin and cyclophosphamide in dose dense fashion x4 followed by weekly paclitaxel x12  (b) received adjuvant radiation  (c) a total of 4 right axillary lymph nodes were removed  (2) status post right breast upper inner quadrant biopsy 09/22/2020 for a clinical T2 N2, stage IIIC invasive ductal carcinoma, grade 3, triple negative, with an MIB-1 of 80%.  (3) genetics testing  (4) adjuvant chemotherapy will consist of pembrolizumab, carboplatin, and paclitaxel  (5) definitive surgery to follow  PLAN: I met today with Damali to review  her new diagnosis. Specifically we discussed the biology of her breast cancer, its diagnosis, staging, treatment  options and prognosis.  She now has a new breast cancer in the ipsilateral breast.  This is if anything more aggressive than the earlier 1, with a higher proliferation fraction and a larger T score.  The treatment of triple negative breast cancer is currently in transition but we have good data for using pembrolizumab both neoadjuvantly and adjuvantly in this setting.  Unfortunately she has already received the best available chemotherapy, namely AC and T.  We can give the paclitaxel a second try since she only received 4 doses previously.  That was limited by peripheral neuropathy . Presently she has no neuropathy symptoms.  We can combine that with carboplatin which can be especially helpful with triple negative tumors.  She will have genetics testing and it may be that we can find additional targets there.  We will also send her definitive surgical sample for foundation 1 testing.  Penny [facility] has a good understanding of the situation.  She will have staging studies and port placement.  She will meet with our chemotherapy teaching nurse.  We are hoping to start her chemotherapy on 10/14/2020 and I will meet her that same day prior to treatment to make sure all her questions have been answered and everything is in place.  Penny Brown has a good understanding of the overall plan. She agrees with it. She knows the goal of treatment in her case is cure. She will call with any problems that may develop before her next visit here.  Total encounter time 65 minutes.Sarajane Jews C. Magrinat, MD 10/11/2020 8:34 AM Medical Oncology and Hematology [facility] [place], [place] [address] Tel. [phone]    Fax. [phone]   This document serves as a record of services personally performed by Lurline Del, MD. It was created on his behalf by Wilburn Mylar, a trained  medical scribe. The creation of this record is based on the scribe's personal observations and the provider's statements to them.   I, Lurline Del MD, have reviewed the above documentation for accuracy and completeness, and I agree with the above.    *Total Encounter Time as defined by the Centers for Medicare and Medicaid Services includes, in addition to the face-to-face time of a patient visit (documented in the note above) non-face-to-face time: obtaining and reviewing outside history, ordering and reviewing medications, tests or procedures, care coordination (communications with other health care professionals or caregivers) and documentation in the medical record.

## 2020-10-12 ENCOUNTER — Other Ambulatory Visit: Payer: Self-pay | Admitting: *Deleted

## 2020-10-12 DIAGNOSIS — C50211 Malignant neoplasm of upper-inner quadrant of right female breast: Secondary | ICD-10-CM

## 2020-10-12 DIAGNOSIS — Z171 Estrogen receptor negative status [ER-]: Secondary | ICD-10-CM

## 2020-10-13 ENCOUNTER — Encounter (HOSPITAL_BASED_OUTPATIENT_CLINIC_OR_DEPARTMENT_OTHER)
Admission: RE | Disposition: A | Discharge status: Home / Self Care | Payer: Self-pay | Source: Home / Self Care | Attending: General Surgery

## 2020-10-13 ENCOUNTER — Encounter (HOSPITAL_BASED_OUTPATIENT_CLINIC_OR_DEPARTMENT_OTHER): Payer: Self-pay | Admitting: General Surgery

## 2020-10-13 ENCOUNTER — Other Ambulatory Visit: Payer: Self-pay

## 2020-10-13 ENCOUNTER — Ambulatory Visit (HOSPITAL_BASED_OUTPATIENT_CLINIC_OR_DEPARTMENT_OTHER): Payer: 59 | Admitting: Certified Registered"

## 2020-10-13 ENCOUNTER — Encounter: Payer: Self-pay | Admitting: Oncology

## 2020-10-13 ENCOUNTER — Ambulatory Visit (HOSPITAL_BASED_OUTPATIENT_CLINIC_OR_DEPARTMENT_OTHER)
Admission: RE | Admit: 2020-10-13 | Discharge: 2020-10-13 | Disposition: A | Discharge status: Home / Self Care | Payer: 59 | Attending: General Surgery | Admitting: General Surgery

## 2020-10-13 ENCOUNTER — Ambulatory Visit (HOSPITAL_COMMUNITY): Payer: 59

## 2020-10-13 DIAGNOSIS — Z419 Encounter for procedure for purposes other than remedying health state, unspecified: Secondary | ICD-10-CM

## 2020-10-13 DIAGNOSIS — C50211 Malignant neoplasm of upper-inner quadrant of right female breast: Secondary | ICD-10-CM | POA: Insufficient documentation

## 2020-10-13 HISTORY — PX: PORTACATH PLACEMENT: SHX2246

## 2020-10-13 LAB — POCT PREGNANCY, URINE: Preg Test, Ur: NEGATIVE

## 2020-10-13 SURGERY — INSERTION, TUNNELED CENTRAL VENOUS DEVICE, WITH PORT
Anesthesia: General | Site: Chest | Laterality: Left

## 2020-10-13 MED ORDER — CEFAZOLIN SODIUM-DEXTROSE 2-4 GM/100ML-% IV SOLN
INTRAVENOUS | Status: AC
  Filled 2020-10-13: qty 100

## 2020-10-13 MED ORDER — LACTATED RINGERS IV SOLN: INTRAVENOUS | Status: DC

## 2020-10-13 MED ORDER — HEPARIN (PORCINE) IN NACL 1000-0.9 UT/500ML-% IV SOLN
INTRAVENOUS | Status: AC
  Filled 2020-10-13: qty 500

## 2020-10-13 MED ORDER — LIDOCAINE 2% (20 MG/ML) 5 ML SYRINGE
INTRAMUSCULAR | Status: DC | PRN
  Administered 2020-10-13: 60 mg via INTRAVENOUS

## 2020-10-13 MED ORDER — HEPARIN SOD (PORK) LOCK FLUSH 100 UNIT/ML IV SOLN
INTRAVENOUS | Status: AC
  Filled 2020-10-13: qty 5

## 2020-10-13 MED ORDER — HEPARIN SOD (PORK) LOCK FLUSH 100 UNIT/ML IV SOLN
INTRAVENOUS | Status: DC | PRN
  Administered 2020-10-13: 500 [IU]

## 2020-10-13 MED ORDER — ACETAMINOPHEN 500 MG PO TABS
ORAL_TABLET | ORAL | Status: AC
  Filled 2020-10-13: qty 2

## 2020-10-13 MED ORDER — ACETAMINOPHEN 500 MG PO TABS
1000.0000 mg | ORAL_TABLET | ORAL | Status: AC
  Administered 2020-10-13: 1000 mg via ORAL

## 2020-10-13 MED ORDER — FENTANYL CITRATE (PF) 100 MCG/2ML IJ SOLN
INTRAMUSCULAR | Status: AC
  Filled 2020-10-13: qty 2

## 2020-10-13 MED ORDER — MIDAZOLAM HCL 5 MG/5ML IJ SOLN
INTRAMUSCULAR | Status: DC | PRN
  Administered 2020-10-13: 2 mg via INTRAVENOUS

## 2020-10-13 MED ORDER — BUPIVACAINE HCL (PF) 0.25 % IJ SOLN
INTRAMUSCULAR | Status: AC
  Filled 2020-10-13: qty 30

## 2020-10-13 MED ORDER — CEFAZOLIN SODIUM-DEXTROSE 2-4 GM/100ML-% IV SOLN
2.0000 g | INTRAVENOUS | Status: AC
  Administered 2020-10-13: 2 g via INTRAVENOUS

## 2020-10-13 MED ORDER — MIDAZOLAM HCL 2 MG/2ML IJ SOLN
INTRAMUSCULAR | Status: AC
  Filled 2020-10-13: qty 2

## 2020-10-13 MED ORDER — DEXAMETHASONE SODIUM PHOSPHATE 4 MG/ML IJ SOLN
INTRAMUSCULAR | Status: DC | PRN
  Administered 2020-10-13: 4 mg via INTRAVENOUS

## 2020-10-13 MED ORDER — PROPOFOL 500 MG/50ML IV EMUL
INTRAVENOUS | Status: AC
  Filled 2020-10-13: qty 150

## 2020-10-13 MED ORDER — OXYCODONE HCL 5 MG/5ML PO SOLN: 5.0000 mg | Freq: Once | ORAL | Status: AC | PRN

## 2020-10-13 MED ORDER — BUPIVACAINE HCL (PF) 0.25 % IJ SOLN
INTRAMUSCULAR | Status: DC | PRN
  Administered 2020-10-13: 5 mL

## 2020-10-13 MED ORDER — OXYCODONE HCL 5 MG PO TABS
5.0000 mg | ORAL_TABLET | Freq: Once | ORAL | Status: AC | PRN
  Administered 2020-10-13: 5 mg via ORAL

## 2020-10-13 MED ORDER — PROPOFOL 10 MG/ML IV BOLUS
INTRAVENOUS | Status: DC | PRN
  Administered 2020-10-13: 150 mg via INTRAVENOUS

## 2020-10-13 MED ORDER — ENSURE PRE-SURGERY PO LIQD: 296.0000 mL | Freq: Once | ORAL | Status: DC

## 2020-10-13 MED ORDER — TRAMADOL HCL 50 MG PO TABS: 50.0000 mg | ORAL_TABLET | Freq: Four times a day (QID) | ORAL | 0 refills | Status: DC | PRN

## 2020-10-13 MED ORDER — FENTANYL CITRATE (PF) 100 MCG/2ML IJ SOLN
INTRAMUSCULAR | Status: DC | PRN
  Administered 2020-10-13: 50 ug via INTRAVENOUS

## 2020-10-13 MED ORDER — ONDANSETRON HCL 4 MG/2ML IJ SOLN
INTRAMUSCULAR | Status: DC | PRN
  Administered 2020-10-13: 4 mg via INTRAVENOUS

## 2020-10-13 MED ORDER — EPHEDRINE 5 MG/ML INJ
INTRAVENOUS | Status: AC
  Filled 2020-10-13: qty 10

## 2020-10-13 MED ORDER — HEPARIN (PORCINE) IN NACL 2-0.9 UNITS/ML
INTRAMUSCULAR | Status: AC | PRN
  Administered 2020-10-13: 1 via INTRAVENOUS

## 2020-10-13 MED ORDER — ONDANSETRON HCL 4 MG/2ML IJ SOLN: 4.0000 mg | Freq: Once | INTRAMUSCULAR | Status: DC | PRN

## 2020-10-13 MED ORDER — FENTANYL CITRATE (PF) 100 MCG/2ML IJ SOLN: 25.0000 ug | INTRAMUSCULAR | Status: DC | PRN

## 2020-10-13 MED ORDER — OXYCODONE HCL 5 MG PO TABS
ORAL_TABLET | ORAL | Status: AC
  Filled 2020-10-13: qty 1

## 2020-10-13 SURGICAL SUPPLY — 55 items
ADH SKN CLS APL DERMABOND .7 (GAUZE/BANDAGES/DRESSINGS) ×1
APL PRP STRL LF DISP 70% ISPRP (MISCELLANEOUS) ×1
APL SKNCLS STERI-STRIP NONHPOA (GAUZE/BANDAGES/DRESSINGS) ×1
BAG DECANTER FOR FLEXI CONT (MISCELLANEOUS) ×2 IMPLANT
BENZOIN TINCTURE PRP APPL 2/3 (GAUZE/BANDAGES/DRESSINGS) ×2 IMPLANT
BLADE SURG 11 STRL SS (BLADE) ×2 IMPLANT
BLADE SURG 15 STRL LF DISP TIS (BLADE) ×1 IMPLANT
BLADE SURG 15 STRL SS (BLADE) ×2
CANISTER SUCT 1200ML W/VALVE (MISCELLANEOUS) IMPLANT
CHLORAPREP W/TINT 26 (MISCELLANEOUS) ×2 IMPLANT
COVER BACK TABLE 60X90IN (DRAPES) ×2 IMPLANT
COVER MAYO STAND STRL (DRAPES) ×2 IMPLANT
COVER PROBE 5X48 (MISCELLANEOUS)
COVER WAND RF STERILE (DRAPES) IMPLANT
DECANTER SPIKE VIAL GLASS SM (MISCELLANEOUS) IMPLANT
DERMABOND ADVANCED (GAUZE/BANDAGES/DRESSINGS) ×1
DERMABOND ADVANCED .7 DNX12 (GAUZE/BANDAGES/DRESSINGS) ×1 IMPLANT
DRAPE C-ARM 42X72 X-RAY (DRAPES) ×2 IMPLANT
DRAPE LAPAROSCOPIC ABDOMINAL (DRAPES) ×2 IMPLANT
DRAPE UTILITY XL STRL (DRAPES) ×2 IMPLANT
DRSG TEGADERM 4X4.75 (GAUZE/BANDAGES/DRESSINGS) ×2 IMPLANT
ELECT COATED BLADE 2.86 ST (ELECTRODE) ×2 IMPLANT
ELECT REM PT RETURN 9FT ADLT (ELECTROSURGICAL) ×2
ELECTRODE REM PT RTRN 9FT ADLT (ELECTROSURGICAL) ×1 IMPLANT
GAUZE SPONGE 4X4 12PLY STRL LF (GAUZE/BANDAGES/DRESSINGS) ×2 IMPLANT
GLOVE SURG ENC MOIS LTX SZ7 (GLOVE) ×2 IMPLANT
GLOVE SURG SS PI 7.0 STRL IVOR (GLOVE) ×2 IMPLANT
GLOVE SURG SYN 7.0 (GLOVE) ×2 IMPLANT
GLOVE SURG SYN 7.0 PF PI (GLOVE) IMPLANT
GLOVE SURG UNDER POLY LF SZ7 (GLOVE) ×1 IMPLANT
GLOVE SURG UNDER POLY LF SZ7.5 (GLOVE) ×2 IMPLANT
GOWN STRL REUS W/ TWL LRG LVL3 (GOWN DISPOSABLE) ×2 IMPLANT
GOWN STRL REUS W/TWL LRG LVL3 (GOWN DISPOSABLE) ×4
IV KIT MINILOC 20X1 SAFETY (NEEDLE) IMPLANT
KIT CVR 48X5XPRB PLUP LF (MISCELLANEOUS) IMPLANT
KIT PORT POWER 8FR ISP CVUE (Port) ×1 IMPLANT
NDL HYPO 25X1 1.5 SAFETY (NEEDLE) ×1 IMPLANT
NDL SAFETY ECLIPSE 18X1.5 (NEEDLE) IMPLANT
NEEDLE HYPO 18GX1.5 SHARP (NEEDLE)
NEEDLE HYPO 25X1 1.5 SAFETY (NEEDLE) ×2 IMPLANT
PACK BASIN DAY SURGERY FS (CUSTOM PROCEDURE TRAY) ×2 IMPLANT
PENCIL SMOKE EVACUATOR (MISCELLANEOUS) ×2 IMPLANT
SLEEVE SCD COMPRESS KNEE MED (MISCELLANEOUS) ×2 IMPLANT
STRIP CLOSURE SKIN 1/2X4 (GAUZE/BANDAGES/DRESSINGS) ×2 IMPLANT
SUT MNCRL AB 4-0 PS2 18 (SUTURE) ×2 IMPLANT
SUT PROLENE 2 0 SH DA (SUTURE) ×2 IMPLANT
SUT SILK 2 0 TIES 17X18 (SUTURE)
SUT SILK 2-0 18XBRD TIE BLK (SUTURE) IMPLANT
SUT VIC AB 3-0 SH 27 (SUTURE) ×2
SUT VIC AB 3-0 SH 27X BRD (SUTURE) ×1 IMPLANT
SYR 5ML LUER SLIP (SYRINGE) ×2 IMPLANT
SYR CONTROL 10ML LL (SYRINGE) ×2 IMPLANT
TOWEL GREEN STERILE FF (TOWEL DISPOSABLE) ×2 IMPLANT
TUBE CONNECTING 20X1/4 (TUBING) IMPLANT
YANKAUER SUCT BULB TIP NO VENT (SUCTIONS) IMPLANT

## 2020-10-13 NOTE — Transfer of Care (Signed)
Immediate Anesthesia Transfer of Care Note  Patient: Penny Brown  Procedure(s) Performed: INSERTION PORT-A-CATH (Left Chest)  Patient Location: PACU  Anesthesia Type:General  Level of Consciousness: awake, alert , oriented and patient cooperative  Airway & Oxygen Therapy: Patient Spontanous Breathing and Patient connected to face mask oxygen  Post-op Assessment: Report given to RN and Post -op Vital signs reviewed and stable  Post vital signs: Reviewed and stable  Last Vitals:  Vitals Value Taken Time  BP 133/92 10/13/20 1330  Temp    Pulse 110 10/13/20 1332  Resp 15 10/13/20 1332  SpO2 100 % 10/13/20 1332  Vitals shown include unvalidated device data.  Last Pain:  Vitals:   10/13/20 1102  TempSrc: Oral  PainSc: 0-No pain      Patients Stated Pain Goal: 4 (86/75/44 9201)  Complications: No complications documented.

## 2020-10-13 NOTE — Progress Notes (Signed)
Penny Brown  [phone] [phone]     ID: Penny Brown DOB: 12/27/1970  MR#: [id number]  TXM#:[id number]  Patient Care Team: [name], NP as PCP - General (Family Medicine) [name], RN as Oncology Nurse Navigator [name], RN as Oncology Nurse Navigator Magrinat, [name], MD as Consulting Physician (Oncology) [name], MD as Consulting Physician (General Surgery) [name], MD as Consulting Physician (Radiation Oncology) Aurea Graff OTHER MD:  CHIEF COMPLAINT: recurrent vs. second triple negative breast cancer  CURRENT TREATMENT: neoadjuvant chemotherapy   INTERVAL HISTORY: Penny Brown returns today for follow up of her second triple negative breast cancer. She was evaluated in the multidisciplinary breast cancer clinic on 09/29/2020.  Since consultation, she underwent breast MRI on 10/08/2020 showing: breast composition B; 2.3 cm biopsy-proven malignancy in upper-inner right breast; no other suspicious findings in remainder of right breast; no evidence of malignancy on left; no suspicious lymphadenopathy.  She also underwent port placement yesterday, 10/13/2020, in anticipation of beginning neoadjuvant chemotherapy, consisting of pembrolizumab, carboplatin, and paclitaxel, today.  She underwent genetic testing during clinic. The results are pending.   REVIEW OF SYSTEMS: Pearson did well with port placement.  She has the usual mild soreness and also the neck discomfort.  She was reassured that this will feel better and more like "a part of herself" after a couple of weeks.  She is a little concerned that coming here so early disrupts her family patterns and she has to drop her daughter at school around 51 most mornings.  We will try to fix that.  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 She received 4 cycles of doxorubicin and cyclophosphamide followed by 4 cycles of of paclitaxel, which was discontinued secondary to neuropathy. She was released from follow up here in 2012.  More recently, she presented with a palpable upper-inner right breast mass. She underwent bilateral diagnostic mammography with tomography and right breast ultrasonography at [facility] on 09/22/2020 showing: breast density category A; 2.2 cm irregular mass in upper-inner right breast; no significant abnormalities in right axilla.  Accordingly on 09/22/2020 she proceeded to biopsy of the right breast area in question. The pathology from this procedure ([id number]) showed: invasive ductal carcinoma, grade 3. Prognostic indicators significant for: estrogen receptor, 0% negative and progesterone receptor, 0% negative. Proliferation marker Ki67 at 80%. HER2 negatve by immunohistochemistry (1+).  Cancer Staging Malignant neoplasm of upper-inner quadrant of right breast in female, estrogen receptor negative ([place]) Staging form: Breast, AJCC 8th Edition - Clinical stage from 09/29/2020: Stage IIB (rcT2, cN0, cM0, G3, ER-, PR-, HER2-) - Signed by [name], MD on 09/29/2020 Stage prefix: Recurrence  Malignant neoplasm of upper-outer quadrant of right breast in female, estrogen receptor negative ([place]) Staging form: Breast, AJCC 8th Edition - Clinical: cT2, cN1, cM0, ER-, PR-, HER2- - Signed by [name], MD on 09/29/2020 Stage prefix: Initial diagnosis Nuclear grade: G3  The patient's subsequent history is as detailed below.   PAST MEDICAL HISTORY: Past Medical History:  Diagnosis Date  . Breast cancer [facility]) 2008   Right Breast Cancer  . Cancer ([place])   . Family  history of  breast cancer   . Family history of prostate cancer   . Personal history of chemotherapy 2008   Right Breast Cancer  . Personal history of radiation therapy 2008   Right Breast Cancer    PAST SURGICAL HISTORY: Past Surgical History:  Procedure Laterality Date  . BREAST LUMPECTOMY Right 2008  . CESAREAN SECTION      FAMILY HISTORY: Family History  Problem Relation Age of Onset  . Breast cancer Maternal Grandmother 78  . Prostate cancer Maternal Grandfather        dx 28s, metastatic   Her father died from heart attack (possibly Covid?) at age 62. Her mother is age 34 as of 09/2020. [name] as 2 brothers and 2 sisters. She reports breast cancer in her maternal grandmother in her late 19's.   GYNECOLOGIC HISTORY:  Patient's last menstrual period was 10/08/2020. Menarche: 50 years old Age at first live birth: 50 years old [place] P 3 LMP 09/09/2020, periods are regular, 7 days with 4 heavy Contraceptive: used for about 1 year HRT n/a  Hysterectomy? no BSO? 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 Husband Penny Brown is a Cabin crew. She lives at home with Penny Brown, daughter Penny Brown, age 34, and daughter Penny Brown, age 62 who works as a Curator. Son Penny Brown, age 70, is a Psychiatric nurse in [employer]. Solaris has one grandchild. She attends [employer].    ADVANCED DIRECTIVES: In the absence of any documentation to the contrary, the patient's spouse is their HCPOA.    HEALTH MAINTENANCE: Social History   Tobacco Use  . Smoking status: Never Smoker  . Smokeless tobacco: Never Used  Substance Use Topics  . Alcohol use: No  . Drug use: No     Colonoscopy: n/a (age)   PAP: 2019  Bone density: n/a (age)   No Known Allergies  Current Outpatient Medications  Medication Sig Dispense Refill  . lidocaine-prilocaine (EMLA) cream Apply to affected area once 30 g 3  .  prochlorperazine (COMPAZINE) 10 MG tablet Take 1 tablet (10 mg total) by mouth every 6 (six) hours as needed (Nausea or vomiting). 30 tablet 1  . traMADol (ULTRAM) 50 MG tablet Take 1 tablet (50 mg total) by mouth every 6 (six) hours as needed. 8 tablet 0   No current facility-administered medications for this visit.    OBJECTIVE: African-American woman who appears younger than stated age  There were no vitals filed for this visit.   There is no height or weight on file to calculate BMI.   Wt Readings from Last 3 Encounters:  10/13/20 169 lb 12.1 oz (77 kg)  09/29/20 170 lb 1.6 oz (77.2 kg)      ECOG FS:1 - Symptomatic but completely ambulatory  Sclerae unicteric, EOMs intact Wearing a mask No cervical or supraclavicular adenopathy Lungs no rales or rhonchi Heart regular rate and rhythm Abd soft, nontender, positive bowel sounds MSK no focal spinal tenderness, no upper extremity lymphedema Neuro: nonfocal, well oriented, appropriate affect Breasts: Deferred   LAB RESULTS:  CMP     Component Value Date/Time   NA 140 09/29/2020 0830   K 3.8 09/29/2020 0830   CL 109 09/29/2020 0830   CO2 23 09/29/2020 0830   GLUCOSE 84 09/29/2020 0830   BUN 9 09/29/2020 0830   CREATININE 0.75 09/29/2020 0830   CALCIUM 8.9 09/29/2020 0830   PROT 7.3 09/29/2020 0830  ALBUMIN 3.6 09/29/2020 0830   AST 14 (L) 09/29/2020 0830   ALT 12 09/29/2020 0830   ALKPHOS 72 09/29/2020 0830   BILITOT 0.3 09/29/2020 0830   GFRNONAA >60 09/29/2020 0830   GFRAA  04/11/2007 1225    >60        The eGFR has been calculated using the MDRD equation. This calculation has not been validated in all clinical    No results found for: TOTALPROTELP, ALBUMINELP, A1GS, A2GS, BETS, BETA2SER, GAMS, MSPIKE, SPEI  Lab Results  Component Value Date   WBC 6.7 09/29/2020   NEUTROABS 4.3 09/29/2020   HGB 11.6 (L) 09/29/2020   HCT 36.6 09/29/2020   MCV 88.0 09/29/2020   PLT 253 09/29/2020    Lab Results   Component Value Date   LABCA2 17 11/25/2007    No components found for: [id number]  No results for input(s): INR in the last 168 hours.  Lab Results  Component Value Date   LABCA2 17 11/25/2007    No results found for: VOJ500  No results found for: XFG182  No results found for: XHB716  No results found for: CA2729  No components found for: HGQUANT  No results found for: CEA1 / No results found for: CEA1   No results found for: AFPTUMOR  No results found for: CHROMOGRNA  No results found for: KPAFRELGTCHN, LAMBDASER, KAPLAMBRATIO (kappa/lambda light chains)  No results found for: HGBA, HGBA2QUANT, HGBFQUANT, HGBSQUAN (Hemoglobinopathy evaluation)   Lab Results  Component Value Date   LDH 156 11/25/2007    No results found for: IRON, TIBC, IRONPCTSAT (Iron and TIBC)  No results found for: FERRITIN  Urinalysis    Component Value Date/Time   LABSPEC 1.015 03/13/2017 1905   PHURINE 7.0 03/13/2017 1905   GLUCOSEU NEGATIVE 03/13/2017 1905   HGBUR MODERATE (A) 03/13/2017 1905   BILIRUBINUR NEGATIVE 03/13/2017 1905   KETONESUR NEGATIVE 03/13/2017 1905   PROTEINUR NEGATIVE 03/13/2017 1905   UROBILINOGEN 0.2 03/13/2017 1905   NITRITE NEGATIVE 03/13/2017 1905   LEUKOCYTESUR MODERATE (A) 03/13/2017 1905    STUDIES: DG Chest 1 View  Result Date: 10/13/2020 CLINICAL DATA:  Left-sided Port-A-Cath placement. EXAM: DG C-ARM 1-60 MIN; CHEST  1 VIEW FLUOROSCOPY TIME:  Fluoroscopy Time:  24 seconds Reported radiation dose: 2.90 mGy COMPARISON:  None. FINDINGS: A single C-arm fluoroscopic images were obtained intraoperatively and submitted for post operative interpretation. This demonstrates a left IJ approach Port-A-Cath with the tip projecting at the superior aspect of the right atrium. No evidence of a catheter kink. Please see the performing provider's procedural report for further detail. IMPRESSION: Single intraoperative fluoroscopic image, as detailed above.  Electronically Signed   By: Margaretha Sheffield MD   On: 10/13/2020 14:13   MR BREAST BILATERAL W WO CONTRAST INC CAD  Result Date: 10/11/2020 CLINICAL DATA:  50 year old female with newly diagnosed right breast cancer. LABS:  None performed on site. EXAM: BILATERAL BREAST MRI WITH AND WITHOUT CONTRAST TECHNIQUE: Multiplanar, multisequence MR images of both breasts were obtained prior to and following the intravenous administration of 7 ml of Gadavist. Three-dimensional MR images were rendered by post-processing of the original MR data on an independent workstation. The three-dimensional MR images were interpreted, and findings are reported in the following complete MRI report for this study. Three dimensional images were evaluated at the independent interpreting workstation using the DynaCAD thin client. COMPARISON:  Previous exam(s). FINDINGS: Breast composition: b. Scattered fibroglandular tissue. Background parenchymal enhancement: Mild. Right breast: Susceptibility artifact from post biopsy  clip is seen in association with an irregular, enhancing mass in the upper inner quadrant of the right breast posteriorly (series 6, image 50/120). It measures 2.3 x 2.2 x 2.1 cm. This is consistent with the patient's biopsy-proven malignancy. No other suspicious mass or abnormal enhancement. Left breast: No suspicious mass or abnormal enhancement. Lymph nodes: No abnormal appearing lymph nodes. Ancillary findings:  None. IMPRESSION: 1. 2.3 cm enhancing mass in the upper inner right breast consistent with the patient's biopsy-proven site of malignancy. No other suspicious findings in the remainder of the right breast. 2. No MRI evidence of malignancy on the left. 3. No suspicious lymphadenopathy. RECOMMENDATION: Per clinical treatment plan. BI-RADS CATEGORY  6: Known biopsy-proven malignancy. Electronically Signed   By: Kristopher Oppenheim M.D.   On: 10/11/2020 12:01   DG C-Arm 1-60 Min  Result Date: 10/13/2020 CLINICAL DATA:   Left-sided Port-A-Cath placement. EXAM: DG C-ARM 1-60 MIN; CHEST  1 VIEW FLUOROSCOPY TIME:  Fluoroscopy Time:  24 seconds Reported radiation dose: 2.90 mGy COMPARISON:  None. FINDINGS: A single C-arm fluoroscopic images were obtained intraoperatively and submitted for post operative interpretation. This demonstrates a left IJ approach Port-A-Cath with the tip projecting at the superior aspect of the right atrium. No evidence of a catheter kink. Please see the performing provider's procedural report for further detail. IMPRESSION: Single intraoperative fluoroscopic image, as detailed above. Electronically Signed   By: Margaretha Sheffield MD   On: 10/13/2020 14:13     ELIGIBLE FOR AVAILABLE RESEARCH PROTOCOL: no  ASSESSMENT: 50 y.o. [place] woman  (1) status post right breast upper outer quadrant lumpectomy and sentinel lymph node sampling 04/15/2007 for a pT2 pN1, stage IIIB invasive ductal carcinoma, grade 3, triple negative, with an MIB-1 of 59%  (a) adjuvant chemotherapy consisted of doxorubicin and cyclophosphamide in dose dense fashion x4 followed by weekly paclitaxel x12  (b) received adjuvant radiation  (c) a total of 4 right axillary lymph nodes were removed  (2) status post right breast upper inner quadrant biopsy 09/22/2020 for a clinical T2 N2, stage IIIC invasive ductal carcinoma, grade 3, triple negative, with an MIB-1 of 80%.  (3) genetics testing in process  (4) adjuvant chemotherapy will consist of pembrolizumab given every 3 weeks, and carboplatin + paclitaxel given weekly, 3 weeks on, one week off, for 6 cycles as tolerated  (5) definitive surgery to follow  (6) continue pembrolizumab to total one year   PLAN:  Adiya is ready to start her chemotherapy today.  She has a good understanding of the possible toxicities side effects and complications and knows how to take her supportive medications.  As a stage III patient I intended to obtain staging studies with a CT of  the chest and bone scan but those have not been ordered.  I went ahead and placed the orders today for them to be done in a couple of weeks or so.  Tawana generally drops her daughter at school at about 730-7:45 in the morning.  It would be more convenient for her to have chemo here around 9.  We will try to arrange for that with her subsequent cycles.  She is going to see me again in 1 week.  We will troubleshoot any side effects at that time  Total encounter time 25 minutes.Sarajane Jews C. Magrinat, MD 10/13/2020 9:25 PM Medical Oncology and Hematology [facility] [place], [place] [address] Tel. [phone]    Fax. [phone]   This document  serves as a record of services personally performed by Lurline Del, MD. It was created on his behalf by Wilburn Mylar, a trained medical scribe. The creation of this record is based on the scribe's personal observations and the provider's statements to them.   I, Lurline Del MD, have reviewed the above documentation for accuracy and completeness, and I agree with the above.   *Total Encounter Time as defined by the Centers for Medicare and Medicaid Services includes, in addition to the face-to-face time of a patient visit (documented in the note above) non-face-to-face time: obtaining and reviewing outside history, ordering and reviewing medications, tests or procedures, care coordination (communications with other health care professionals or caregivers) and documentation in the medical record.

## 2020-10-13 NOTE — Anesthesia Preprocedure Evaluation (Signed)
Anesthesia Evaluation  Patient identified by MRN, date of birth, ID band Patient awake    Reviewed: Allergy & Precautions, NPO status , Patient's Chart, lab work & pertinent test results  Airway Mallampati: II  TM Distance: >3 FB Neck ROM: Full    Dental no notable dental hx. (+) Teeth Intact   Pulmonary neg pulmonary ROS,    Pulmonary exam normal breath sounds clear to auscultation       Cardiovascular negative cardio ROS Normal cardiovascular exam Rhythm:Regular Rate:Normal     Neuro/Psych negative neurological ROS  negative psych ROS   GI/Hepatic negative GI ROS, Neg liver ROS,   Endo/Other  Right Breast Ca Hx/o Right Breast Ca S/P lumpectomy and ChemoRx Obesity  Renal/GU negative Renal ROS  negative genitourinary   Musculoskeletal negative musculoskeletal ROS (+)   Abdominal (+) + obese,   Peds  Hematology negative hematology ROS (+)   Anesthesia Other Findings   Reproductive/Obstetrics negative OB ROS                             Anesthesia Physical Anesthesia Plan  ASA: II  Anesthesia Plan: General   Post-op Pain Management:    Induction: Intravenous  PONV Risk Score and Plan: 4 or greater and Treatment may vary due to age or medical condition, Ondansetron and Dexamethasone  Airway Management Planned: LMA  Additional Equipment:   Intra-op Plan:   Post-operative Plan: Extubation in OR  Informed Consent: I have reviewed the patients History and Physical, chart, labs and discussed the procedure including the risks, benefits and alternatives for the proposed anesthesia with the patient or authorized representative who has indicated his/her understanding and acceptance.     Dental advisory given  Plan Discussed with: CRNA and Anesthesiologist  Anesthesia Plan Comments:         Anesthesia Quick Evaluation

## 2020-10-13 NOTE — Progress Notes (Signed)
Called pt to introduce myself as her Arboriculturist, discuss copay assistance and the [employer].  Pt would like to apply so I completed the [employer] enrollmentapplication for [facility], will get the pt's and Dr. [name] signature and will fax for processing.  I will notify the pt of the outcome once received.  I also informed her of the [employer], went over what it covers and gave her the income requirement.  Pt will have to discuss their income with her husband because he's self employed.  I requested that she bring her proof of income on 10/14/20 if she meets the income requirement.  If she's approved I will give her an expense sheet and my card for any questions or concerns she may have in the future.

## 2020-10-13 NOTE — Anesthesia Postprocedure Evaluation (Signed)
Anesthesia Post Note  Patient: Penny Brown  Procedure(s) Performed: INSERTION PORT-A-CATH (Left Chest)     Patient location during evaluation: PACU Anesthesia Type: General Level of consciousness: awake and alert and oriented Pain management: pain level controlled Vital Signs Assessment: post-procedure vital signs reviewed and stable Respiratory status: spontaneous breathing, nonlabored ventilation and respiratory function stable Cardiovascular status: blood pressure returned to baseline and stable Postop Assessment: no apparent nausea or vomiting Anesthetic complications: no   No complications documented.  Last Vitals:  Vitals:   10/13/20 1355 10/13/20 1400  BP:  (!) 147/88  Pulse: 93 91  Resp: 13 14  Temp:    SpO2: 100% 100%    Last Pain:  Vitals:   10/13/20 1400  TempSrc:   PainSc: 4                  Breck [place] A.

## 2020-10-13 NOTE — Anesthesia Procedure Notes (Signed)
Procedure Name: LMA Insertion Date/Time: 10/13/2020 12:37 PM Performed by: Signe Colt, CRNA Pre-anesthesia Checklist: Patient identified, Emergency Drugs available, Suction available and Patient being monitored Patient Re-evaluated:Patient Re-evaluated prior to induction Oxygen Delivery Method: Circle System Utilized Preoxygenation: Pre-oxygenation with 100% oxygen Induction Type: IV induction Ventilation: Mask ventilation without difficulty LMA: LMA inserted LMA Size: 4.0 Number of attempts: 1 Airway Equipment and Method: bite block Placement Confirmation: positive ETCO2 Tube secured with: Tape Dental Injury: Teeth and Oropharynx as per pre-operative assessment

## 2020-10-13 NOTE — Op Note (Signed)
Preoperative diagnosis TNBC Postoperative diagnosis: Same as above Procedure: Left internal jugular port placement with ultrasound guidance Surgeon: Dr. Serita Grammes Anesthesia: General  Estimated blood loss:minimal Specimens:none Sponge and needlecount was correct atcompletion Drains: None Disposition recovery stable condition  Indications: 43 yof presents with new right breast cancer. in 2008 she had a T2N1a right breast cancer that was TN treated with lumpectomy, chemotherapy and radiotherapy. Will have to see what nodal surgery was done but I suspect this was ALND then. she has no rue issues. she recently noted a right upper inner quadrant breast mass. on mm and [place] she has a 2.2 cm mass. axillary [place] is negative. 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 We discussed port placement for primary chemotherapy.   Procedure: After informed consent was obtainedshe was taken to the OR.She was given antibiotics. SCDs were placed. She was placed under general anesthesia without complication. She was prepped and draped in the standard sterile surgical fashion. A surgical timeout was then performed.  I used the ultrasound to identify the left internal jugular vein. Under ultrasound guidance I then accessed the vein with the needle. I passed the wire. The wire was in the vein both by ultrasound and by fluoroscopy. I thenmade an incisionon her left chest. I tunneled the line between the 2 sites. I then placed the dilator over the wire. I observed this with fluoroscopy to go in the correct position. I then removedthe wire. I then passed the line. The peel-away sheath was removed. I pulled the line back to be in the superior vena cava. I then attached the port. I sutured this into place with 2-0 Prolene. I then closed this with 3-0 Vicryl and 4-0 Monocryl. Glue was placed. Final fluoroscopic image showed the port to be in good position. I then accessed the port and  was able to aspirate blood and packed this with heparin.

## 2020-10-13 NOTE — H&P (Signed)
50 yof presents with new right breast cancer. in 2008 she had a T2N1a right breast cancer that was TN treated with lumpectomy, chemotherapy and radiotherapy. Will have to see what nodal surgery was done but I suspect this was ALND then. she has no rue issues. she recently noted a right upper inner quadrant breast mass. on mm and [place] she has a 2.2 cm mass. axillary [place] is negative. 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 she is here with her husband to discuss options.   Past Surgical History Conni Slipper, RN; 09/29/2020 8:18 AM) Breast Biopsy  Right. Breast Mass; Local Excision  Right. Cesarean Section - Multiple   Diagnostic Studies History Conni Slipper, RN; 09/29/2020 8:18 AM) Colonoscopy  never Mammogram  within last year Pap Smear  1-5 years ago  Medication History Conni Slipper, RN; 09/29/2020 8:17 AM) Medications Reconciled  Social History Conni Slipper, RN; 09/29/2020 8:18 AM) Alcohol use  Remotely quit alcohol use. Caffeine use  Carbonated beverages, Coffee, Tea. No drug use  Tobacco use  Never smoker.  Family History Conni Slipper, RN; 09/29/2020 8:18 AM) Arthritis  Family Members In General. Breast Cancer  Family Members In General. Cerebrovascular Accident  Father. Diabetes Mellitus  Father, Mother. Hypertension  Father, Mother. Prostate Cancer  Family Members In General. Thyroid problems  Sister.  Pregnancy / Birth History Conni Slipper, RN; 09/29/2020 8:18 AM) Age at menarche  28 years. Contraceptive History  Oral contraceptives. Gravida  5 Maternal age  66-20 Para  3 Regular periods   Other Problems Conni Slipper, RN; 09/29/2020 8:18 AM) Breast Cancer  Hemorrhoids  Lump In Breast     Review of Systems Conni Slipper RN; 09/29/2020 8:18 AM) General Not Present- Appetite Loss, Chills, Fatigue, Fever, Night Sweats, Weight Gain and Weight Loss. Skin Not Present- Change in Wart/Mole, Dryness, Hives, Jaundice, New Lesions, Non-Healing  Wounds, Rash and Ulcer. HEENT Present- Seasonal Allergies and Wears glasses/contact lenses. Not Present- Earache, Hearing Loss, Hoarseness, Nose Bleed, Oral Ulcers, Ringing in the Ears, Sinus Pain, Sore Throat, Visual Disturbances and Yellow Eyes. Respiratory Not Present- Bloody sputum, Chronic Cough, Difficulty Breathing, Snoring and Wheezing. Breast Present- Breast Mass and Breast Pain. Not Present- Nipple Discharge and Skin Changes. Cardiovascular Not Present- Chest Pain, Difficulty Breathing Lying Down, Leg Cramps, Palpitations, Rapid Heart Rate, Shortness of Breath and Swelling of Extremities. Gastrointestinal Present- Change in Bowel Habits, Constipation and Hemorrhoids. Not Present- Abdominal Pain, Bloating, Bloody Stool, Chronic diarrhea, Difficulty Swallowing, Excessive gas, Gets full quickly at meals, Indigestion, Nausea, Rectal Pain and Vomiting. Female Genitourinary Not Present- Frequency, Nocturia, Painful Urination, Pelvic Pain and Urgency. Musculoskeletal Not Present- Back Pain, Joint Pain, Joint Stiffness, Muscle Pain, Muscle Weakness and Swelling of Extremities. Neurological Not Present- Decreased Memory, Fainting, Headaches, Numbness, Seizures, Tingling, Tremor, Trouble walking and Weakness. Psychiatric Not Present- Anxiety, Bipolar, Change in Sleep Pattern, Depression, Fearful and Frequent crying. Endocrine Not Present- Cold Intolerance, Excessive Hunger, Hair Changes, Heat Intolerance, Hot flashes and New Diabetes. Hematology Not Present- Blood Thinners, Easy Bruising, Excessive bleeding, Gland problems, HIV and Persistent Infections.   Physical Exam [name] MD; 09/29/2020 1:45 PM) General Mental Status-Alert. Orientation-Oriented X3. Breast Nipples-No Discharge. Note: healed right axillary and breast scars from prior surgery 3.5 cm right upper inner quadrant breast mass mobile nontender Lymphatic Head & Neck General Head & Neck Lymphatics: Bilateral -  Description - Normal. Axillary General Axillary Region: Bilateral - Description - Normal. Note: no Kerkhoven adenopathy   Assessment & Plan (  [name] MD; 09/29/2020 1:52 PM) BREAST CANCER OF UPPER-INNER QUADRANT OF RIGHT FEMALE BREAST (C50.211) Story: Genetic testing, port placement, primary chemotherapy We discussed the staging and pathophysiology of breast cancer. We discussed all of the different options for treatment for breast cancer including surgery, chemotherapy, radiation therapy, Herceptin, and antiestrogen therapy. We discussed rationale for chemo first with prognostic information. We discussed possible repeat sn surgery once I find out what prior surgery was. No evidence of any nodal involvement now. We discussed that mastectomy is standard of care for her after chemotherapy. I will get an mri now also. If that and genetics are both negative could consider repeat lumpectomy and re-irradiation. I have discussed this with rad onc. for now will plan for port placement which I discussed will plan to do soon so we can start chemotherapy.

## 2020-10-13 NOTE — Interval H&P Note (Signed)
History and Physical Interval Note:  10/13/2020 12:19 PM  Penny Brown  has presented today for surgery, with the diagnosis of RIGHT BREAST CANCER.  The various methods of treatment have been discussed with the patient and family. After consideration of risks, benefits and other options for treatment, the patient has consented to  Procedure(s) with comments: INSERTION PORT-A-CATH (N/A) - LEAVE PORT ACCESSED CHEMO STARTS ON 2/10, LEFT SIDE as a surgical intervention.  The patient's history has been reviewed, patient examined, no change in status, stable for surgery.  I have reviewed the patient's chart and labs.  Questions were answered to the patient's satisfaction.     [name]

## 2020-10-13 NOTE — Discharge Instructions (Signed)
° ° °PORT-A-CATH: POST OP INSTRUCTIONS ° °Always review your discharge instruction sheet given to you by the facility where your surgery was performed.  ° °1. A prescription for pain medication may be given to you upon discharge. Take your pain medication as prescribed, if needed. If narcotic pain medicine is not needed, then you make take acetaminophen (Tylenol) or ibuprofen (Advil) as needed.  °2. Take your usually prescribed medications unless otherwise directed. °3. If you need a refill on your pain medication, please contact our office. All narcotic pain medicine now requires a paper prescription.  Phoned in and fax refills are no longer allowed by law.  Prescriptions will not be filled after 5 pm or on weekends.  °4. You should follow a light diet for the remainder of the day after your procedure. °5. Most patients will experience some mild swelling and/or bruising in the area of the incision. It may take several days to resolve. °6. It is common to experience some constipation if taking pain medication after surgery. Increasing fluid intake and taking a stool softener (such as Colace) will usually help or prevent this problem from occurring. A mild laxative (Milk of Magnesia or Miralax) should be taken according to package directions if there are no bowel movements after 48 hours.  °7. Unless discharge instructions indicate otherwise, you may remove your bandages 48 hours after surgery, and you may shower at that time. You may have steri-strips (small white skin tapes) in place directly over the incision.  These strips should be left on the skin for 7-10 days.  If your surgeon used Dermabond (skin glue) on the incision, you may shower in 24 hours.  The glue will flake off over the next 2-3 weeks.  °8. If your port is left accessed at the end of surgery (needle left in port), the dressing cannot get wet and should only by changed by a healthcare professional. When the port is no longer accessed (when the  needle has been removed), follow step 7.   °9. ACTIVITIES:  Limit activity involving your arms for the next 72 hours. Do no strenuous exercise or activity for 1 week. You may drive when you are no longer taking prescription pain medication, you can comfortably wear a seatbelt, and you can maneuver your car. °10.You may need to see your doctor in the office for a follow-up appointment.  Please °      check with your doctor.  °11.When you receive a new Port-a-Cath, you will get a product guide and  °      ID card.  Please keep them in case you need them. ° °WHEN TO CALL YOUR DOCTOR ([phone]): °1. Fever over 101.0 °2. Chills °3. Continued bleeding from incision °4. Increased redness and tenderness at the site °5. Shortness of breath, difficulty breathing ° ° °The clinic staff is available to answer your questions during regular business hours. Please don’t hesitate to call and ask to speak to one of the nurses or medical assistants for clinical concerns. If you have a medical emergency, go to the nearest emergency room or call 911.  A surgeon from [facility] is always on call at the hospital.  ° ° ° °For further information, please visit [email or web address] ° ° ° ° °Post Anesthesia Home Care Instructions ° °Activity: °Get plenty of rest for the remainder of the day. A responsible individual must stay with you for 24 hours following the procedure.  °For the next 24 hours, DO NOT: °-Drive   a car -Paediatric nurse -Drink alcoholic beverages -Take any medication unless instructed by your physician -Make any legal decisions or sign important papers.  Meals: Start with liquid foods such as gelatin or soup. Progress to regular foods as tolerated. Avoid greasy, spicy, heavy foods. If nausea and/or vomiting occur, drink only clear liquids until the nausea and/or vomiting subsides. Call your physician if vomiting continues.  Special Instructions/Symptoms: Your throat may feel dry or sore  from the anesthesia or the breathing tube placed in your throat during surgery. If this causes discomfort, gargle with warm salt water. The discomfort should disappear within 24 hours.  If you had a scopolamine patch placed behind your ear for the management of post- operative nausea and/or vomiting:  1. The medication in the patch is effective for 72 hours, after which it should be removed.  Wrap patch in a tissue and discard in the trash. Wash hands thoroughly with soap and water. 2. You may remove the patch earlier than 72 hours if you experience unpleasant side effects which may include dry mouth, dizziness or visual disturbances. 3. Avoid touching the patch. Wash your hands with soap and water after contact with the patch.    Next dose of Tylenol due at 5:15pm.

## 2020-10-14 ENCOUNTER — Ambulatory Visit: Payer: 59

## 2020-10-14 ENCOUNTER — Encounter (HOSPITAL_BASED_OUTPATIENT_CLINIC_OR_DEPARTMENT_OTHER): Payer: Self-pay | Admitting: General Surgery

## 2020-10-14 ENCOUNTER — Telehealth: Payer: Self-pay | Admitting: Oncology

## 2020-10-14 ENCOUNTER — Other Ambulatory Visit: Payer: 59

## 2020-10-14 ENCOUNTER — Inpatient Hospital Stay (HOSPITAL_BASED_OUTPATIENT_CLINIC_OR_DEPARTMENT_OTHER): Payer: 59 | Admitting: Oncology

## 2020-10-14 ENCOUNTER — Other Ambulatory Visit: Payer: Self-pay

## 2020-10-14 ENCOUNTER — Ambulatory Visit (HOSPITAL_BASED_OUTPATIENT_CLINIC_OR_DEPARTMENT_OTHER): Payer: 59 | Admitting: Medical

## 2020-10-14 ENCOUNTER — Inpatient Hospital Stay: Payer: 59

## 2020-10-14 ENCOUNTER — Encounter: Payer: Self-pay | Admitting: *Deleted

## 2020-10-14 VITALS — BP 142/78 | HR 85 | Temp 98.2°F | Resp 18

## 2020-10-14 VITALS — BP 149/74 | HR 100 | Temp 97.5°F | Resp 18 | Ht 62.0 in | Wt 168.8 lb

## 2020-10-14 DIAGNOSIS — Z5111 Encounter for antineoplastic chemotherapy: Secondary | ICD-10-CM | POA: Diagnosis not present

## 2020-10-14 DIAGNOSIS — C50411 Malignant neoplasm of upper-outer quadrant of right female breast: Secondary | ICD-10-CM | POA: Diagnosis not present

## 2020-10-14 DIAGNOSIS — Z803 Family history of malignant neoplasm of breast: Secondary | ICD-10-CM | POA: Diagnosis not present

## 2020-10-14 DIAGNOSIS — C50211 Malignant neoplasm of upper-inner quadrant of right female breast: Secondary | ICD-10-CM

## 2020-10-14 DIAGNOSIS — Z171 Estrogen receptor negative status [ER-]: Secondary | ICD-10-CM

## 2020-10-14 DIAGNOSIS — G629 Polyneuropathy, unspecified: Secondary | ICD-10-CM | POA: Diagnosis not present

## 2020-10-14 DIAGNOSIS — K5900 Constipation, unspecified: Secondary | ICD-10-CM | POA: Diagnosis not present

## 2020-10-14 DIAGNOSIS — Z8042 Family history of malignant neoplasm of prostate: Secondary | ICD-10-CM | POA: Diagnosis not present

## 2020-10-14 DIAGNOSIS — Z79899 Other long term (current) drug therapy: Secondary | ICD-10-CM | POA: Diagnosis not present

## 2020-10-14 DIAGNOSIS — Z923 Personal history of irradiation: Secondary | ICD-10-CM | POA: Diagnosis not present

## 2020-10-14 LAB — CMP (CANCER CENTER ONLY)
ALT: 12 U/L (ref 0–44)
AST: 14 U/L — ABNORMAL LOW (ref 15–41)
Albumin: 3.6 g/dL (ref 3.5–5.0)
Alkaline Phosphatase: 73 U/L (ref 38–126)
Anion gap: 8 (ref 5–15)
BUN: 14 mg/dL (ref 6–20)
CO2: 23 mmol/L (ref 22–32)
Calcium: 9.1 mg/dL (ref 8.9–10.3)
Chloride: 107 mmol/L (ref 98–111)
Creatinine: 0.76 mg/dL (ref 0.44–1.00)
GFR, Estimated: >60 mL/min (ref >60)
Glucose, Bld: 118 mg/dL — ABNORMAL HIGH (ref 70–99)
Potassium: 4 mmol/L (ref 3.5–5.1)
Sodium: 138 mmol/L (ref 135–145)
Total Bilirubin: <0.2 mg/dL — ABNORMAL LOW (ref 0.3–1.2)
Total Protein: 7.1 g/dL (ref 6.5–8.1)

## 2020-10-14 LAB — CBC WITH DIFFERENTIAL (CANCER CENTER ONLY)
Abs Immature Granulocytes: 0.04 10*3/uL (ref 0.00–0.07)
Basophils Absolute: 0 10*3/uL (ref 0.0–0.1)
Basophils Relative: 0 %
Eosinophils Absolute: 0 10*3/uL (ref 0.0–0.5)
Eosinophils Relative: 0 %
HCT: 33.1 % — ABNORMAL LOW (ref 36.0–46.0)
Hemoglobin: 10.5 g/dL — ABNORMAL LOW (ref 12.0–15.0)
Immature Granulocytes: 0 %
Lymphocytes Relative: 15 %
Lymphs Abs: 1.7 10*3/uL (ref 0.7–4.0)
MCH: 27.6 pg (ref 26.0–34.0)
MCHC: 31.7 g/dL (ref 30.0–36.0)
MCV: 87.1 fL (ref 80.0–100.0)
Monocytes Absolute: 0.6 10*3/uL (ref 0.1–1.0)
Monocytes Relative: 5 %
Neutro Abs: 9.2 10*3/uL — ABNORMAL HIGH (ref 1.7–7.7)
Neutrophils Relative %: 80 %
Platelet Count: 282 10*3/uL (ref 150–400)
RBC: 3.8 MIL/uL — ABNORMAL LOW (ref 3.87–5.11)
RDW: 13.8 % (ref 11.5–15.5)
WBC Count: 11.6 10*3/uL — ABNORMAL HIGH (ref 4.0–10.5)
nRBC: 0 % (ref 0.0–0.2)

## 2020-10-14 LAB — PREGNANCY, URINE: Preg Test, Ur: NEGATIVE

## 2020-10-14 LAB — TSH: TSH: 0.794 u[IU]/mL (ref 0.308–3.960)

## 2020-10-14 MED ORDER — PALONOSETRON HCL INJECTION 0.25 MG/5ML
INTRAVENOUS | Status: AC
  Filled 2020-10-14: qty 5

## 2020-10-14 MED ORDER — SODIUM CHLORIDE 0.9 % IV SOLN
Freq: Once | INTRAVENOUS | Status: AC
  Filled 2020-10-14: qty 250

## 2020-10-14 MED ORDER — FAMOTIDINE IN NACL 20-0.9 MG/50ML-% IV SOLN
INTRAVENOUS | Status: AC
  Filled 2020-10-14: qty 50

## 2020-10-14 MED ORDER — SODIUM CHLORIDE 0.9% FLUSH
10.0000 mL | INTRAVENOUS | Status: DC | PRN
  Administered 2020-10-14: 10 mL
  Filled 2020-10-14: qty 10

## 2020-10-14 MED ORDER — HEPARIN SOD (PORK) LOCK FLUSH 100 UNIT/ML IV SOLN
500.0000 [IU] | Freq: Once | INTRAVENOUS | Status: AC | PRN
  Administered 2020-10-14: 500 [IU]
  Filled 2020-10-14: qty 5

## 2020-10-14 MED ORDER — SODIUM CHLORIDE 0.9 % IV SOLN
200.0000 mg | Freq: Once | INTRAVENOUS | Status: AC
  Administered 2020-10-14: 200 mg via INTRAVENOUS
  Filled 2020-10-14: qty 8

## 2020-10-14 MED ORDER — SODIUM CHLORIDE 0.9 % IV SOLN
80.0000 mg/m2 | Freq: Once | INTRAVENOUS | Status: AC
  Administered 2020-10-14: 150 mg via INTRAVENOUS
  Filled 2020-10-14: qty 25

## 2020-10-14 MED ORDER — DIPHENHYDRAMINE HCL 50 MG/ML IJ SOLN
25.0000 mg | Freq: Once | INTRAMUSCULAR | Status: AC
  Administered 2020-10-14: 25 mg via INTRAVENOUS

## 2020-10-14 MED ORDER — FAMOTIDINE IN NACL 20-0.9 MG/50ML-% IV SOLN
20.0000 mg | Freq: Once | INTRAVENOUS | Status: AC
  Administered 2020-10-14: 20 mg via INTRAVENOUS

## 2020-10-14 MED ORDER — SODIUM CHLORIDE 0.9 % IV SOLN
255.0000 mg | Freq: Once | INTRAVENOUS | Status: AC
  Administered 2020-10-14: 260 mg via INTRAVENOUS
  Filled 2020-10-14: qty 26

## 2020-10-14 MED ORDER — SODIUM CHLORIDE 0.9 % IV SOLN
20.0000 mg | Freq: Once | INTRAVENOUS | Status: AC
  Administered 2020-10-14: 20 mg via INTRAVENOUS
  Filled 2020-10-14: qty 20

## 2020-10-14 MED ORDER — PALONOSETRON HCL INJECTION 0.25 MG/5ML
0.2500 mg | Freq: Once | INTRAVENOUS | Status: AC
  Administered 2020-10-14: 0.25 mg via INTRAVENOUS

## 2020-10-14 MED ORDER — DIPHENHYDRAMINE HCL 50 MG/ML IJ SOLN
INTRAMUSCULAR | Status: AC
  Filled 2020-10-14: qty 1

## 2020-10-14 NOTE — Patient Instructions (Signed)
Carboplatin injection What is this medicine? CARBOPLATIN (KAR boe pla tin) is a chemotherapy drug. It targets fast dividing cells, like cancer cells, and causes these cells to die. This medicine is used to treat ovarian cancer and many other cancers. This medicine may be used for other purposes; ask your health care provider or pharmacist if you have questions. COMMON BRAND NAME(S): Paraplatin What should I tell my health care provider before I take this medicine? They need to know if you have any of these conditions:  blood disorders  hearing problems  kidney disease  recent or ongoing radiation therapy  an unusual or allergic reaction to carboplatin, cisplatin, other chemotherapy, other medicines, foods, dyes, or preservatives  pregnant or trying to get pregnant  breast-feeding How should I use this medicine? This drug is usually given as an infusion into a vein. It is administered in a hospital or clinic by a specially trained health care professional. Talk to your pediatrician regarding the use of this medicine in children. Special care may be needed. Overdosage: If you think you have taken too much of this medicine contact a poison control center or emergency room at once. NOTE: This medicine is only for you. Do not share this medicine with others. What if I miss a dose? It is important not to miss a dose. Call your doctor or health care professional if you are unable to keep an appointment. What may interact with this medicine?  medicines for seizures  medicines to increase blood counts like filgrastim, pegfilgrastim, sargramostim  some antibiotics like amikacin, gentamicin, neomycin, streptomycin, tobramycin  vaccines Talk to your doctor or health care professional before taking any of these medicines:  acetaminophen  aspirin  ibuprofen  ketoprofen  naproxen This list may not describe all possible interactions. Give your health care provider a list of all the  medicines, herbs, non-prescription drugs, or dietary supplements you use. Also tell them if you smoke, drink alcohol, or use illegal drugs. Some items may interact with your medicine. What should I watch for while using this medicine? Your condition will be monitored carefully while you are receiving this medicine. You will need important blood work done while you are taking this medicine. This drug may make you feel generally unwell. This is not uncommon, as chemotherapy can affect healthy cells as well as cancer cells. Report any side effects. Continue your course of treatment even though you feel ill unless your doctor tells you to stop. In some cases, you may be given additional medicines to help with side effects. Follow all directions for their use. Call your doctor or health care professional for advice if you get a fever, chills or sore throat, or other symptoms of a cold or flu. Do not treat yourself. This drug decreases your body's ability to fight infections. Try to avoid being around people who are sick. This medicine may increase your risk to bruise or bleed. Call your doctor or health care professional if you notice any unusual bleeding. Be careful brushing and flossing your teeth or using a toothpick because you may get an infection or bleed more easily. If you have any dental work done, tell your dentist you are receiving this medicine. Avoid taking products that contain aspirin, acetaminophen, ibuprofen, naproxen, or ketoprofen unless instructed by your doctor. These medicines may hide a fever. Do not become pregnant while taking this medicine. Women should inform their doctor if they wish to become pregnant or think they might be pregnant. There is a  potential for serious side effects to an unborn child. Talk to your health care professional or pharmacist for more information. Do not breast-feed an infant while taking this medicine. What side effects may I notice from receiving this  medicine? Side effects that you should report to your doctor or health care professional as soon as possible:  allergic reactions like skin rash, itching or hives, swelling of the face, lips, or tongue  signs of infection - fever or chills, cough, sore throat, pain or difficulty passing urine  signs of decreased platelets or bleeding - bruising, pinpoint red spots on the skin, black, tarry stools, nosebleeds  signs of decreased red blood cells - unusually weak or tired, fainting spells, lightheadedness  breathing problems  changes in hearing  changes in vision  chest pain  high blood pressure  low blood counts - This drug may decrease the number of white blood cells, red blood cells and platelets. You may be at increased risk for infections and bleeding.  nausea and vomiting  pain, swelling, redness or irritation at the injection site  pain, tingling, numbness in the hands or feet  problems with balance, talking, walking  trouble passing urine or change in the amount of urine Side effects that usually do not require medical attention (report to your doctor or health care professional if they continue or are bothersome):  hair loss  loss of appetite  metallic taste in the mouth or changes in taste This list may not describe all possible side effects. Call your doctor for medical advice about side effects. You may report side effects to FDA at 1-800-FDA-1088. Where should I keep my medicine? This drug is given in a hospital or clinic and will not be stored at home. NOTE: This sheet is a summary. It may not cover all possible information. If you have questions about this medicine, talk to your doctor, pharmacist, or health care provider.  2021 Elsevier/Gold Standard (2007-11-26 14:38:05) Paclitaxel injection What is this medicine? PACLITAXEL (PAK li TAX el) is a chemotherapy drug. It targets fast dividing cells, like cancer cells, and causes these cells to die. This  medicine is used to treat ovarian cancer, breast cancer, lung cancer, Kaposi's sarcoma, and other cancers. This medicine may be used for other purposes; ask your health care provider or pharmacist if you have questions. COMMON BRAND NAME(S): Onxol, Taxol What should I tell my health care provider before I take this medicine? They need to know if you have any of these conditions:  history of irregular heartbeat  liver disease  low blood counts, like low white cell, platelet, or red cell counts  lung or breathing disease, like asthma  tingling of the fingers or toes, or other nerve disorder  an unusual or allergic reaction to paclitaxel, alcohol, polyoxyethylated castor oil, other chemotherapy, other medicines, foods, dyes, or preservatives  pregnant or trying to get pregnant  breast-feeding How should I use this medicine? This drug is given as an infusion into a vein. It is administered in a hospital or clinic by a specially trained health care professional. Talk to your pediatrician regarding the use of this medicine in children. Special care may be needed. Overdosage: If you think you have taken too much of this medicine contact a poison control center or emergency room at once. NOTE: This medicine is only for you. Do not share this medicine with others. What if I miss a dose? It is important not to miss your dose. Call your doctor or   health care professional if you are unable to keep an appointment. What may interact with this medicine? Do not take this medicine with any of the following medications:  live virus vaccines This medicine may also interact with the following medications:  antiviral medicines for hepatitis, HIV or AIDS  certain antibiotics like erythromycin and clarithromycin  certain medicines for fungal infections like ketoconazole and itraconazole  certain medicines for seizures like carbamazepine, phenobarbital,  phenytoin  gemfibrozil  nefazodone  rifampin  St. John's wort This list may not describe all possible interactions. Give your health care provider a list of all the medicines, herbs, non-prescription drugs, or dietary supplements you use. Also tell them if you smoke, drink alcohol, or use illegal drugs. Some items may interact with your medicine. What should I watch for while using this medicine? Your condition will be monitored carefully while you are receiving this medicine. You will need important blood work done while you are taking this medicine. This medicine can cause serious allergic reactions. To reduce your risk you will need to take other medicine(s) before treatment with this medicine. If you experience allergic reactions like skin rash, itching or hives, swelling of the face, lips, or tongue, tell your doctor or health care professional right away. In some cases, you may be given additional medicines to help with side effects. Follow all directions for their use. This drug may make you feel generally unwell. This is not uncommon, as chemotherapy can affect healthy cells as well as cancer cells. Report any side effects. Continue your course of treatment even though you feel ill unless your doctor tells you to stop. Call your doctor or health care professional for advice if you get a fever, chills or sore throat, or other symptoms of a cold or flu. Do not treat yourself. This drug decreases your body's ability to fight infections. Try to avoid being around people who are sick. This medicine may increase your risk to bruise or bleed. Call your doctor or health care professional if you notice any unusual bleeding. Be careful brushing and flossing your teeth or using a toothpick because you may get an infection or bleed more easily. If you have any dental work done, tell your dentist you are receiving this medicine. Avoid taking products that contain aspirin, acetaminophen, ibuprofen,  naproxen, or ketoprofen unless instructed by your doctor. These medicines may hide a fever. Do not become pregnant while taking this medicine. Women should inform their doctor if they wish to become pregnant or think they might be pregnant. There is a potential for serious side effects to an unborn child. Talk to your health care professional or pharmacist for more information. Do not breast-feed an infant while taking this medicine. Men are advised not to father a child while receiving this medicine. This product may contain alcohol. Ask your pharmacist or healthcare provider if this medicine contains alcohol. Be sure to tell all healthcare providers you are taking this medicine. Certain medicines, like metronidazole and disulfiram, can cause an unpleasant reaction when taken with alcohol. The reaction includes flushing, headache, nausea, vomiting, sweating, and increased thirst. The reaction can last from 30 minutes to several hours. What side effects may I notice from receiving this medicine? Side effects that you should report to your doctor or health care professional as soon as possible:  allergic reactions like skin rash, itching or hives, swelling of the face, lips, or tongue  breathing problems  changes in vision  fast, irregular heartbeat  high or   low blood pressure  mouth sores  pain, tingling, numbness in the hands or feet  signs of decreased platelets or bleeding - bruising, pinpoint red spots on the skin, black, tarry stools, blood in the urine  signs of decreased red blood cells - unusually weak or tired, feeling faint or lightheaded, falls  signs of infection - fever or chills, cough, sore throat, pain or difficulty passing urine  signs and symptoms of liver injury like dark yellow or brown urine; general ill feeling or flu-like symptoms; light-colored stools; loss of appetite; nausea; right upper belly pain; unusually weak or tired; yellowing of the eyes or  skin  swelling of the ankles, feet, hands  unusually slow heartbeat Side effects that usually do not require medical attention (report to your doctor or health care professional if they continue or are bothersome):  diarrhea  hair loss  loss of appetite  muscle or joint pain  nausea, vomiting  pain, redness, or irritation at site where injected  tiredness This list may not describe all possible side effects. Call your doctor for medical advice about side effects. You may report side effects to FDA at 1-800-FDA-1088. Where should I keep my medicine? This drug is given in a hospital or clinic and will not be stored at home. NOTE: This sheet is a summary. It may not cover all possible information. If you have questions about this medicine, talk to your doctor, pharmacist, or health care provider.  2021 Elsevier/Gold Standard (2019-07-23 13:37:23) Pembrolizumab injection What is this medicine? PEMBROLIZUMAB (pem broe liz ue mab) is a monoclonal antibody. It is used to treat certain types of cancer. This medicine may be used for other purposes; ask your health care provider or pharmacist if you have questions. COMMON BRAND NAME(S): Keytruda What should I tell my health care provider before I take this medicine? They need to know if you have any of these conditions:  autoimmune diseases like Crohn's disease, ulcerative colitis, or lupus  have had or planning to have an allogeneic stem cell transplant (uses someone else's stem cells)  history of organ transplant  history of chest radiation  nervous system problems like myasthenia gravis or Guillain-Barre syndrome  an unusual or allergic reaction to pembrolizumab, other medicines, foods, dyes, or preservatives  pregnant or trying to get pregnant  breast-feeding How should I use this medicine? This medicine is for infusion into a vein. It is given by a health care professional in a hospital or clinic setting. A special  MedGuide will be given to you before each treatment. Be sure to read this information carefully each time. Talk to your pediatrician regarding the use of this medicine in children. While this drug may be prescribed for children as young as 6 months for selected conditions, precautions do apply. Overdosage: If you think you have taken too much of this medicine contact a poison control center or emergency room at once. NOTE: This medicine is only for you. Do not share this medicine with others. What if I miss a dose? It is important not to miss your dose. Call your doctor or health care professional if you are unable to keep an appointment. What may interact with this medicine? Interactions have not been studied. This list may not describe all possible interactions. Give your health care provider a list of all the medicines, herbs, non-prescription drugs, or dietary supplements you use. Also tell them if you smoke, drink alcohol, or use illegal drugs. Some items may interact with your medicine.  What should I watch for while using this medicine? Your condition will be monitored carefully while you are receiving this medicine. You may need blood work done while you are taking this medicine. Do not become pregnant while taking this medicine or for 4 months after stopping it. Women should inform their doctor if they wish to become pregnant or think they might be pregnant. There is a potential for serious side effects to an unborn child. Talk to your health care professional or pharmacist for more information. Do not breast-feed an infant while taking this medicine or for 4 months after the last dose. What side effects may I notice from receiving this medicine? Side effects that you should report to your doctor or health care professional as soon as possible:  allergic reactions like skin rash, itching or hives, swelling of the face, lips, or tongue  bloody or black, tarry  breathing problems  changes  in vision  chest pain  chills  confusion  constipation  cough  diarrhea  dizziness or feeling faint or lightheaded  fast or irregular heartbeat  fever  flushing  joint pain  low blood counts - this medicine may decrease the number of white blood cells, red blood cells and platelets. You may be at increased risk for infections and bleeding.  muscle pain  muscle weakness  pain, tingling, numbness in the hands or feet  persistent headache  redness, blistering, peeling or loosening of the skin, including inside the mouth  signs and symptoms of high blood sugar such as dizziness; dry mouth; dry skin; fruity breath; nausea; stomach pain; increased hunger or thirst; increased urination  signs and symptoms of kidney injury like trouble passing urine or change in the amount of urine  signs and symptoms of liver injury like dark urine, light-colored stools, loss of appetite, nausea, right upper belly pain, yellowing of the eyes or skin  sweating  swollen lymph nodes  weight loss Side effects that usually do not require medical attention (report to your doctor or health care professional if they continue or are bothersome):  decreased appetite  hair loss  tiredness This list may not describe all possible side effects. Call your doctor for medical advice about side effects. You may report side effects to FDA at 1-800-FDA-1088. Where should I keep my medicine? This drug is given in a hospital or clinic and will not be stored at home. NOTE: This sheet is a summary. It may not cover all possible information. If you have questions about this medicine, talk to your doctor, pharmacist, or health care provider.  2021 Elsevier/Gold Standard (2019-07-23 21:44:53)

## 2020-10-14 NOTE — Telephone Encounter (Signed)
Release: [id number] Faxed records to  [facility] @ [employer] @ fax [phone]

## 2020-10-14 NOTE — Addendum Note (Signed)
Addended by: [name] on: 10/14/2020 09:08 AM   Modules accepted: Orders

## 2020-10-15 ENCOUNTER — Telehealth: Payer: Self-pay | Admitting: Oncology

## 2020-10-15 NOTE — Telephone Encounter (Signed)
Scheduled appts per 2/10 los. Pt to get updated appt calendar at next visit per appt notes.

## 2020-10-18 NOTE — Progress Notes (Signed)
Erroneous encounter

## 2020-10-19 ENCOUNTER — Other Ambulatory Visit: Payer: Self-pay

## 2020-10-19 DIAGNOSIS — C50211 Malignant neoplasm of upper-inner quadrant of right female breast: Secondary | ICD-10-CM

## 2020-10-19 DIAGNOSIS — Z171 Estrogen receptor negative status [ER-]: Secondary | ICD-10-CM

## 2020-10-20 ENCOUNTER — Ambulatory Visit: Payer: Self-pay | Admitting: Genetic Counselor

## 2020-10-20 ENCOUNTER — Encounter: Payer: Self-pay | Admitting: Oncology

## 2020-10-20 ENCOUNTER — Encounter: Payer: Self-pay | Admitting: Genetic Counselor

## 2020-10-20 ENCOUNTER — Telehealth: Payer: Self-pay | Admitting: Genetic Counselor

## 2020-10-20 DIAGNOSIS — Z1379 Encounter for other screening for genetic and chromosomal anomalies: Secondary | ICD-10-CM | POA: Insufficient documentation

## 2020-10-20 MED FILL — Dexamethasone Sodium Phosphate Inj 100 MG/10ML: INTRAMUSCULAR | Qty: 2 | Status: AC

## 2020-10-20 NOTE — Telephone Encounter (Signed)
Revealed negative genetic testing. Discussed that we do not know why she has breast cancer or why there is cancer in the family. It is possible that there could be a mutation in a different gene that we are not testing, or our current technology may not be able to detect certain mutations. It will therefore be important for her to stay in contact with genetics to keep up with whether additional testing may be appropriate in the future.

## 2020-10-20 NOTE — Progress Notes (Signed)
HPI:  Penny Brown was previously seen in the [place] [facility] due to a personal and family history of cancer and concerns regarding a hereditary predisposition to cancer. Please refer to our prior cancer genetics clinic note for more information regarding our discussion, assessment and recommendations, at the time. Penny Brown recent genetic test results were disclosed to her, as were recommendations warranted by these results. These results and recommendations are discussed in more detail below.  CANCER HISTORY:  Oncology History  Malignant neoplasm of upper-inner quadrant of right breast in female, estrogen receptor negative ([place])  09/27/2020 Initial Diagnosis   Malignant neoplasm of upper-inner quadrant of right breast in female, estrogen receptor negative ([place])   09/29/2020 Cancer Staging   Staging form: Breast, AJCC 8th Edition - Clinical stage from 09/29/2020: Stage IIB (rcT2, cN0, cM0, G3, ER-, PR-, HER2-) - Signed by [name], MD on 09/29/2020   10/14/2020 -  Chemotherapy    Patient is on Treatment Plan: BREAST CARBOPLATIN D1,8,15 + PACLITAXEL D1,8,15 Q28D X 6 CYCLES      10/19/2020 Genetic Testing   Negative genetic testing:  No pathogenic variants detected on the [facility] Common Hereditary Cancers panel. The report date is 10/19/2020.   The Common Hereditary Cancers Panel offered by [facility] includes sequencing and/or deletion duplication testing of the following 48 genes: APC, ATM, AXIN2, BARD1, BMPR1A, BRCA1, BRCA2, BRIP1, CDH1, CDK4, CDKN2A (p14ARF), CDKN2A (p16INK4a), CHEK2, CTNNA1, DICER1, EPCAM (Deletion/duplication testing only), GREM1 (promoter region deletion/duplication testing only), KIT, MEN1, MLH1, MSH2, MSH3, MSH6, MUTYH, NBN, NF1, NTHL1, PALB2, PDGFRA, PMS2, POLD1, POLE, PTEN, RAD50, RAD51C, RAD51D, RNF43, SDHB, SDHC, SDHD, SMAD4, SMARCA4. STK11, TP53, TSC1, TSC2, and VHL.  The following genes were evaluated for sequence changes only: SDHA and  HOXB13 c.251G>A variant only.   Malignant neoplasm of upper-outer quadrant of right breast in female, estrogen receptor negative ([place])  09/29/2020 Initial Diagnosis   Malignant neoplasm of upper-outer quadrant of right breast in female, estrogen receptor negative ([place])   09/29/2020 Cancer Staging   Staging form: Breast, AJCC 8th Edition - Clinical: cT2, cN1, cM0, ER-, PR-, HER2- - Signed by [name], MD on 09/29/2020   10/19/2020 Genetic Testing   Negative genetic testing:  No pathogenic variants detected on the [facility] Common Hereditary Cancers panel. The report date is 10/19/2020.   The Common Hereditary Cancers Panel offered by [facility] includes sequencing and/or deletion duplication testing of the following 48 genes: APC, ATM, AXIN2, BARD1, BMPR1A, BRCA1, BRCA2, BRIP1, CDH1, CDK4, CDKN2A (p14ARF), CDKN2A (p16INK4a), CHEK2, CTNNA1, DICER1, EPCAM (Deletion/duplication testing only), GREM1 (promoter region deletion/duplication testing only), KIT, MEN1, MLH1, MSH2, MSH3, MSH6, MUTYH, NBN, NF1, NTHL1, PALB2, PDGFRA, PMS2, POLD1, POLE, PTEN, RAD50, RAD51C, RAD51D, RNF43, SDHB, SDHC, SDHD, SMAD4, SMARCA4. STK11, TP53, TSC1, TSC2, and VHL.  The following genes were evaluated for sequence changes only: SDHA and HOXB13 c.251G>A variant only.     FAMILY HISTORY:  We obtained a detailed, 4-generation family history.  Significant diagnoses are listed below: Family History  Problem Relation Age of Onset  . Breast cancer Maternal Grandmother 78  . Prostate cancer Maternal Grandfather        dx 53s, metastatic   Ms. Bray has one son (age 76) and two daughters (ages 85 and 16). She has two brothers and two sisters (ages 63s-51). None of these family members have had cancer.  Ms. Camerer mother is 38 and has not had cancer. She had three maternal aunts and two maternal uncles. There is  no known cancer among maternal first cousins. Her maternal grandmother was diagnosed with breast cancer at  age 38. Her maternal grandfather died from prostate cancer at age 62 (first diagnosed in his 40s).   Ms. Asselin's father died at age 48 without cancer. She has three paternal aunts and one paternal uncle. There is no known cancer among paternal cousins. Her paternal grandmother died in her 35s/40s. Her paternal grandfather died older than 66.   Ms. Randle is unaware of previous family history of genetic testing for hereditary cancer risks. Patient's ancestors are of unknown descent. There is no reported Ashkenazi Jewish ancestry. There is no known consanguinity.  GENETIC TEST RESULTS: Genetic testing reported out on 10/19/2020 through the [facility] Common Hereditary Cancers panel. No pathogenic variants were detected.   The Common Hereditary Cancers Panel offered by [facility] includes sequencing and/or deletion duplication testing of the following 48 genes: APC, ATM, AXIN2, BARD1, BMPR1A, BRCA1, BRCA2, BRIP1, CDH1, CDK4, CDKN2A (p14ARF), CDKN2A (p16INK4a), CHEK2, CTNNA1, DICER1, EPCAM (Deletion/duplication testing only), GREM1 (promoter region deletion/duplication testing only), KIT, MEN1, MLH1, MSH2, MSH3, MSH6, MUTYH, NBN, NF1, NTHL1, PALB2, PDGFRA, PMS2, POLD1, POLE, PTEN, RAD50, RAD51C, RAD51D, RNF43, SDHB, SDHC, SDHD, SMAD4, SMARCA4. STK11, TP53, TSC1, TSC2, and VHL.  The following genes were evaluated for sequence changes only: SDHA and HOXB13 c.251G>A variant only. The test report will be scanned into EPIC and located under the Molecular Pathology section of the Results Review tab.  A portion of the result report is included below for reference.     We discussed with Ms. Corson that because current genetic testing is not perfect, it is possible there may be a gene mutation in one of these genes that current testing cannot detect, but that chance is small.  We also discussed that there could be another gene that has not yet been discovered, or that we have not yet tested, that is responsible for  the cancer diagnoses in the family. It is also possible there is a hereditary cause for the cancer in the family that Ms. Falconi did not inherit and therefore was not identified in her testing.  Therefore, it is important to remain in touch with cancer genetics in the future so that we can continue to offer Ms. Halliday the most up to date genetic testing.   CANCER SCREENING RECOMMENDATIONS: Ms. Palecek test result is considered negative (normal).  This means that we have not identified a hereditary cause for her personal and family history of cancer at this time. While reassuring, this does not definitively rule out a hereditary predisposition to cancer. It is still possible that there could be genetic mutations that are undetectable by current technology. There could be genetic mutations in genes that have not been tested or identified to increase cancer risk.  Therefore, it is recommended she continue to follow the cancer management and screening guidelines provided by her oncology and primary healthcare provider.   An individual's cancer risk and medical management are not determined by genetic test results alone. Overall cancer risk assessment incorporates additional factors, including personal medical history, family history, and any available genetic information that may result in a personalized plan for cancer prevention and surveillance.  RECOMMENDATIONS FOR FAMILY MEMBERS:  Individuals in this family might be at some increased risk of developing cancer, over the general population risk, simply due to the family history of cancer.  We recommended women in this family have a yearly mammogram beginning 10 years younger than the earliest onset of cancer,  an annual clinical breast exam, and perform monthly breast self-exams. Women in this family should also have a gynecological exam as recommended by their primary provider. All family members should be referred for colonoscopy starting at age  63.  FOLLOW-UP: Lastly, we discussed with Ms. Michaux that cancer genetics is a rapidly advancing field and it is possible that new genetic tests will be appropriate for her and/or her family members in the future. We encouraged her to remain in contact with cancer genetics on an annual basis so we can update her personal and family histories and let her know of advances in cancer genetics that may benefit this family.   Our contact number was provided. Ms. Aven questions were answered to her satisfaction, and she knows she is welcome to call us at anytime with additional questions or concerns.   Clint Guy, MS, [facility] Genetic Counselor [email or web address] Phone: [phone]

## 2020-10-20 NOTE — Progress Notes (Signed)
Pthas been enrolledw/theMerckCopay Assistance programfor Keytruda for $25,000 from 09/04/20- 09/03/21.Her copay for Beryle Flock will be $25.  Pt is overqualified for the [employer].

## 2020-10-20 NOTE — Progress Notes (Signed)
[facility]  Telephone:(336) [phone] Fax:(336) [phone]     ID: Penny Brown DOB: 01/20/1971  MR#: [id number]  NFA#:[id number]  Patient Care Team: Georgiann Cocker, NP as PCP - General (Family Medicine) Pershing Proud, RN as Oncology Nurse Navigator Donnelly Angelica, RN as Oncology Nurse Navigator [name], Valentino Hue, MD as Consulting Physician (Oncology) Emelia Loron, MD as Consulting Physician (General Surgery) Dorothy Puffer, MD as Consulting Physician (Radiation Oncology) Lowella Dell, MD OTHER MD:  CHIEF COMPLAINT: recurrent vs. second triple negative breast cancer  CURRENT TREATMENT: neoadjuvant chemotherapy   INTERVAL HISTORY: Penny Brown returns today for follow up and treatment of her second triple negative breast cancer.  She began neoadjuvant chemotherapy, consisting of pembrolizumab, carboplatin, and paclitaxel, at her last visit on 10/14/2020.  Today she receives only the carboplatin and paclitaxel.  She underwent genetic testing during clinic. Results were negative.  She is scheduled for staging chest CT and bone scan on 10/27/2020.   REVIEW OF SYSTEMS: Penny Brown did generally well with her first treatment.  She had slight nausea but no vomiting.  She says eating a little bit helped and she did not take any nausea medication.  She had slight fatigue but still was able to work, of course from home.  She had no mouth sores no constipation and no diarrhea.  She had no rash.  She had a little bit of numbness in her feet and hands.  She says this happens early in the morning for about 10 or 15 minutes and goes away when she walks.  Then in the evening she has a little bit as well and her husband gave her a massage.  Recall she has no diabetes and certainly no alcohol use problem.  A detailed review of systems today was otherwise stable   COVID 19 VACCINATION STATUS: fully vaccinated [employer]), with booster 06/2020   HISTORY OF CURRENT ILLNESS: From the  original intake note:  Penny Brown has a history of 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 She received 4 cycles of doxorubicin and cyclophosphamide followed by 4 cycles of of paclitaxel, which was discontinued secondary to neuropathy. She was released from follow up here in 2012.  More recently, she presented with a palpable upper-inner right breast mass. She underwent bilateral diagnostic mammography with tomography and right breast ultrasonography at [facility] on 09/22/2020 showing: breast density category A; 2.2 cm irregular mass in upper-inner right breast; no significant abnormalities in right axilla.  Accordingly on 09/22/2020 she proceeded to biopsy of the right breast area in question. The pathology from this procedure ([id number]) showed: invasive ductal carcinoma, grade 3. Prognostic indicators significant for: estrogen receptor, 0% negative and progesterone receptor, 0% negative. Proliferation marker Ki67 at 80%. HER2 negatve by immunohistochemistry (1+).  Cancer Staging Malignant neoplasm of upper-inner quadrant of right breast in female, estrogen receptor negative (HCC) Staging form: Breast, AJCC 8th Edition - Clinical stage from 09/29/2020: Stage IIB (rcT2, cN0, cM0, G3, ER-, PR-, HER2-) - Signed by Lowella Dell, MD on 09/29/2020 Stage prefix: Recurrence  Malignant neoplasm of upper-outer quadrant of right breast in female, estrogen receptor negative (HCC) Staging form: Breast, AJCC 8th Edition - Clinical: cT2, cN1, cM0, ER-, PR-, HER2- - Signed by Lowella Dell, MD on 09/29/2020 Stage prefix: Initial diagnosis Nuclear grade: G3  The patient's subsequent history is as detailed below.   PAST MEDICAL HISTORY: Past Medical History:  Diagnosis Date  .  Breast cancer [facility]) 2008   Right Breast Cancer  . Cancer (HCC)   . Family history of breast cancer   . Family history of  prostate cancer   . Personal history of chemotherapy 2008   Right Breast Cancer  . Personal history of radiation therapy 2008   Right Breast Cancer    PAST SURGICAL HISTORY: Past Surgical History:  Procedure Laterality Date  . BREAST LUMPECTOMY Right 2008  . CESAREAN SECTION    . PORTACATH PLACEMENT Left 10/13/2020   Procedure: INSERTION PORT-A-CATH;  Surgeon: Emelia Loron, MD;  Location: [facility];  Service: General;  Laterality: Left;  LEAVE PORT ACCESSED CHEMO STARTS ON 2/10, LEFT SIDE    FAMILY HISTORY: Family History  Problem Relation Age of Onset  . Breast cancer Maternal Grandmother 41  . Prostate cancer Maternal Grandfather        dx 12s, metastatic   Her father died from heart attack (possibly Covid?) at age 46. Her mother is age 58 as of 09/2020. Penny Brown as 2 brothers and 2 sisters. She reports breast cancer in her maternal grandmother in her late 57's.   GYNECOLOGIC HISTORY:  Patient's last menstrual period was 10/08/2020. Menarche: 50 years old Age at first live birth: 50 years old GX P 3 LMP 09/09/2020, periods are regular, 7 days with 4 heavy Contraceptive: used for about 1 year HRT n/a  Hysterectomy? no BSO? 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 Husband Penny Brown is a Veterinary surgeon. She lives at home with Penny Brown, daughter Penny Brown, age 73, and daughter Penny Brown, age 51 who works as a Risk manager. Son Penny Brown, age 39, is a Therapist, nutritional in [employer]. Penny Brown has one grandchild. She attends [employer].    ADVANCED DIRECTIVES: In the absence of any documentation to the contrary, the patient's spouse is their HCPOA.    HEALTH MAINTENANCE: Social History   Tobacco Use  . Smoking status: Never Smoker  . Smokeless tobacco: Never Used  Substance Use Topics  . Alcohol use: No  . Drug use: No     Colonoscopy: n/a (age)   PAP: 2019  Bone density:  n/a (age)   No Known Allergies  Current Outpatient Medications  Medication Sig Dispense Refill  . lidocaine-prilocaine (EMLA) cream Apply to affected area once 30 g 3  . prochlorperazine (COMPAZINE) 10 MG tablet Take 1 tablet (10 mg total) by mouth every 6 (six) hours as needed (Nausea or vomiting). 30 tablet 1  . traMADol (ULTRAM) 50 MG tablet Take 1 tablet (50 mg total) by mouth every 6 (six) hours as needed. 8 tablet 0   No current facility-administered medications for this visit.    OBJECTIVE: African-American woman who appears younger than stated age  Vitals:   10/21/20 0938  BP: 139/62  Pulse: (!) 103  Resp: 20  Temp: (!) 97.2 F (36.2 C)  SpO2: 100%     Body mass index is 31.08 kg/m.   Wt Readings from Last 3 Encounters:  10/21/20 169 lb 14.4 oz (77.1 kg)  10/14/20 168 lb 12.8 oz (76.6 kg)  10/13/20 169 lb 12.1 oz (77 kg)      ECOG FS:1 - Symptomatic but completely ambulatory  Sclerae unicteric, EOMs intact Wearing a mask No cervical or supraclavicular adenopathy Lungs no rales or rhonchi Heart regular rate and rhythm Abd soft, nontender, positive bowel sounds MSK no focal spinal tenderness, no  upper extremity lymphedema Neuro: nonfocal, well oriented, appropriate affect Breasts: The mass in the upper inner quadrant of the right breast is still palpable.  Otherwise breast exam is unremarkable today   LAB RESULTS:  CMP     Component Value Date/Time   NA 138 10/14/2020 0800   K 4.0 10/14/2020 0800   CL 107 10/14/2020 0800   CO2 23 10/14/2020 0800   GLUCOSE 118 (H) 10/14/2020 0800   BUN 14 10/14/2020 0800   CREATININE 0.76 10/14/2020 0800   CALCIUM 9.1 10/14/2020 0800   PROT 7.1 10/14/2020 0800   ALBUMIN 3.6 10/14/2020 0800   AST 14 (L) 10/14/2020 0800   ALT 12 10/14/2020 0800   ALKPHOS 73 10/14/2020 0800   BILITOT <0.2 (L) 10/14/2020 0800   GFRNONAA >60 10/14/2020 0800   GFRAA  04/11/2007 1225    >60        The eGFR has been calculated using the  MDRD equation. This calculation has not been validated in all clinical    No results found for: TOTALPROTELP, ALBUMINELP, A1GS, A2GS, BETS, BETA2SER, GAMS, MSPIKE, SPEI  Lab Results  Component Value Date   WBC 5.7 10/21/2020   NEUTROABS 3.5 10/21/2020   HGB 10.6 (L) 10/21/2020   HCT 32.3 (L) 10/21/2020   MCV 87.1 10/21/2020   PLT 265 10/21/2020    Lab Results  Component Value Date   LABCA2 17 11/25/2007    No components found for: [id number]  No results for input(s): INR in the last 168 hours.  Lab Results  Component Value Date   LABCA2 17 11/25/2007    No results found for: NWG956  No results found for: OZH086  No results found for: VHQ469  No results found for: CA2729  No components found for: HGQUANT  No results found for: CEA1 / No results found for: CEA1   No results found for: AFPTUMOR  No results found for: CHROMOGRNA  No results found for: KPAFRELGTCHN, LAMBDASER, KAPLAMBRATIO (kappa/lambda light chains)  No results found for: HGBA, HGBA2QUANT, HGBFQUANT, HGBSQUAN (Hemoglobinopathy evaluation)   Lab Results  Component Value Date   LDH 156 11/25/2007    No results found for: IRON, TIBC, IRONPCTSAT (Iron and TIBC)  No results found for: FERRITIN  Urinalysis    Component Value Date/Time   LABSPEC 1.015 03/13/2017 1905   PHURINE 7.0 03/13/2017 1905   GLUCOSEU NEGATIVE 03/13/2017 1905   HGBUR MODERATE (A) 03/13/2017 1905   BILIRUBINUR NEGATIVE 03/13/2017 1905   KETONESUR NEGATIVE 03/13/2017 1905   PROTEINUR NEGATIVE 03/13/2017 1905   UROBILINOGEN 0.2 03/13/2017 1905   NITRITE NEGATIVE 03/13/2017 1905   LEUKOCYTESUR MODERATE (A) 03/13/2017 1905    STUDIES: DG Chest 1 View  Result Date: 10/13/2020 CLINICAL DATA:  Left-sided Port-A-Cath placement. EXAM: DG C-ARM 1-60 MIN; CHEST  1 VIEW FLUOROSCOPY TIME:  Fluoroscopy Time:  24 seconds Reported radiation dose: 2.90 mGy COMPARISON:  None. FINDINGS: A single C-arm fluoroscopic images were  obtained intraoperatively and submitted for post operative interpretation. This demonstrates a left IJ approach Port-A-Cath with the tip projecting at the superior aspect of the right atrium. No evidence of a catheter kink. Please see the performing provider's procedural report for further detail. IMPRESSION: Single intraoperative fluoroscopic image, as detailed above. Electronically Signed   By: Feliberto Harts MD   On: 10/13/2020 14:13   MR BREAST BILATERAL W WO CONTRAST INC CAD  Result Date: 10/11/2020 CLINICAL DATA:  50 year old female with newly diagnosed right breast cancer. LABS:  None performed on  site. EXAM: BILATERAL BREAST MRI WITH AND WITHOUT CONTRAST TECHNIQUE: Multiplanar, multisequence MR images of both breasts were obtained prior to and following the intravenous administration of 7 ml of Gadavist. Three-dimensional MR images were rendered by post-processing of the original MR data on an independent workstation. The three-dimensional MR images were interpreted, and findings are reported in the following complete MRI report for this study. Three dimensional images were evaluated at the independent interpreting workstation using the DynaCAD thin client. COMPARISON:  Previous exam(s). FINDINGS: Breast composition: b. Scattered fibroglandular tissue. Background parenchymal enhancement: Mild. Right breast: Susceptibility artifact from post biopsy clip is seen in association with an irregular, enhancing mass in the upper inner quadrant of the right breast posteriorly (series 6, image 50/120). It measures 2.3 x 2.2 x 2.1 cm. This is consistent with the patient's biopsy-proven malignancy. No other suspicious mass or abnormal enhancement. Left breast: No suspicious mass or abnormal enhancement. Lymph nodes: No abnormal appearing lymph nodes. Ancillary findings:  None. IMPRESSION: 1. 2.3 cm enhancing mass in the upper inner right breast consistent with the patient's biopsy-proven site of malignancy. No  other suspicious findings in the remainder of the right breast. 2. No MRI evidence of malignancy on the left. 3. No suspicious lymphadenopathy. RECOMMENDATION: Per clinical treatment plan. BI-RADS CATEGORY  6: Known biopsy-proven malignancy. Electronically Signed   By: Sande Brothers M.D.   On: 10/11/2020 12:01   DG C-Arm 1-60 Min  Result Date: 10/13/2020 CLINICAL DATA:  Left-sided Port-A-Cath placement. EXAM: DG C-ARM 1-60 MIN; CHEST  1 VIEW FLUOROSCOPY TIME:  Fluoroscopy Time:  24 seconds Reported radiation dose: 2.90 mGy COMPARISON:  None. FINDINGS: A single C-arm fluoroscopic images were obtained intraoperatively and submitted for post operative interpretation. This demonstrates a left IJ approach Port-A-Cath with the tip projecting at the superior aspect of the right atrium. No evidence of a catheter kink. Please see the performing provider's procedural report for further detail. IMPRESSION: Single intraoperative fluoroscopic image, as detailed above. Electronically Signed   By: Feliberto Harts MD   On: 10/13/2020 14:13     ELIGIBLE FOR AVAILABLE RESEARCH PROTOCOL: no  ASSESSMENT: 50 y.o. [place] woman  (1) status post right breast upper outer quadrant lumpectomy and sentinel lymph node sampling 04/15/2007 for a pT2 pN1, stage IIIB invasive ductal carcinoma, grade 3, triple negative, with an MIB-1 of 59%  (a) adjuvant chemotherapy consisted of doxorubicin and cyclophosphamide in dose dense fashion x4 followed by weekly paclitaxel x12  (b) received adjuvant radiation  (c) a total of 4 right axillary lymph nodes were removed  (2) status post right breast upper inner quadrant biopsy 09/22/2020 for a clinical T2 N2, stage IIIC invasive ductal carcinoma, grade 3, triple negative, with an MIB-1 of 80%.  (3) genetics testing in process  (4) adjuvant chemotherapy will consist of pembrolizumab given every 3 weeks, and carboplatin + paclitaxel given weekly, 3 weeks on, one week off, for 6  cycles as tolerated  (5) definitive surgery to follow  (6) continue pembrolizumab to total one year   PLAN:  Cashe tolerated last week's chemotherapy generally well and we are proceeding with the day 8 dose today.  I am not sure why she would have tingling and numbness in her hands very briefly here and there.  As noted above she does not have diabetes, does not abuse alcohol, and does not have a history of peripheral neuropathy.  Difficulty believe that a single dose of Taxol could do this but it certainly is not impossible.  I am going to go and add a visit with me next week just to make sure this is not progressive.  Otherwise she is already scheduled for scans before her next week visit.  She knows to call for any other issue that may develop before then  Total encounter time today 25 minutes.  Valentino Hue. Liviah Cake, MD 10/21/2020 10:09 AM Medical Oncology and Hematology [facility] [address] [place], [place] [address] Tel. [phone]    Fax. [phone]   This document serves as a record of services personally performed by Ruthann Cancer, MD. It was created on his behalf by Mickie Bail, a trained medical scribe. The creation of this record is based on the scribe's personal observations and the provider's statements to them.   I, Ruthann Cancer MD, have reviewed the above documentation for accuracy and completeness, and I agree with the above.   *Total Encounter Time as defined by the Centers for Medicare and Medicaid Services includes, in addition to the face-to-face time of a patient visit (documented in the note above) non-face-to-face time: obtaining and reviewing outside history, ordering and reviewing medications, tests or procedures, care coordination (communications with other health care professionals or caregivers) and documentation in the medical record.

## 2020-10-21 ENCOUNTER — Inpatient Hospital Stay: Payer: 59

## 2020-10-21 ENCOUNTER — Other Ambulatory Visit: Payer: Self-pay

## 2020-10-21 ENCOUNTER — Inpatient Hospital Stay (HOSPITAL_BASED_OUTPATIENT_CLINIC_OR_DEPARTMENT_OTHER): Payer: 59 | Admitting: Oncology

## 2020-10-21 VITALS — BP 139/62 | HR 103 | Temp 97.2°F | Resp 20 | Ht 62.0 in | Wt 169.9 lb

## 2020-10-21 VITALS — HR 98

## 2020-10-21 DIAGNOSIS — C50211 Malignant neoplasm of upper-inner quadrant of right female breast: Secondary | ICD-10-CM | POA: Diagnosis not present

## 2020-10-21 DIAGNOSIS — Z171 Estrogen receptor negative status [ER-]: Secondary | ICD-10-CM

## 2020-10-21 DIAGNOSIS — Z95828 Presence of other vascular implants and grafts: Secondary | ICD-10-CM | POA: Insufficient documentation

## 2020-10-21 HISTORY — DX: Presence of other vascular implants and grafts: Z95.828

## 2020-10-21 LAB — CMP (CANCER CENTER ONLY)
ALT: 16 U/L (ref 0–44)
AST: 16 U/L (ref 15–41)
Albumin: 3.5 g/dL (ref 3.5–5.0)
Alkaline Phosphatase: 77 U/L (ref 38–126)
Anion gap: 7 (ref 5–15)
BUN: 10 mg/dL (ref 6–20)
CO2: 24 mmol/L (ref 22–32)
Calcium: 8.7 mg/dL — ABNORMAL LOW (ref 8.9–10.3)
Chloride: 105 mmol/L (ref 98–111)
Creatinine: 0.7 mg/dL (ref 0.44–1.00)
GFR, Estimated: >60 mL/min (ref >60)
Glucose, Bld: 109 mg/dL — ABNORMAL HIGH (ref 70–99)
Potassium: 3.8 mmol/L (ref 3.5–5.1)
Sodium: 136 mmol/L (ref 135–145)
Total Bilirubin: 0.3 mg/dL (ref 0.3–1.2)
Total Protein: 7.1 g/dL (ref 6.5–8.1)

## 2020-10-21 LAB — CBC WITH DIFFERENTIAL (CANCER CENTER ONLY)
Abs Immature Granulocytes: 0.07 10*3/uL (ref 0.00–0.07)
Basophils Absolute: 0 10*3/uL (ref 0.0–0.1)
Basophils Relative: 1 %
Eosinophils Absolute: 0.2 10*3/uL (ref 0.0–0.5)
Eosinophils Relative: 4 %
HCT: 32.3 % — ABNORMAL LOW (ref 36.0–46.0)
Hemoglobin: 10.6 g/dL — ABNORMAL LOW (ref 12.0–15.0)
Immature Granulocytes: 1 %
Lymphocytes Relative: 29 %
Lymphs Abs: 1.7 10*3/uL (ref 0.7–4.0)
MCH: 28.6 pg (ref 26.0–34.0)
MCHC: 32.8 g/dL (ref 30.0–36.0)
MCV: 87.1 fL (ref 80.0–100.0)
Monocytes Absolute: 0.2 10*3/uL (ref 0.1–1.0)
Monocytes Relative: 4 %
Neutro Abs: 3.5 10*3/uL (ref 1.7–7.7)
Neutrophils Relative %: 61 %
Platelet Count: 265 10*3/uL (ref 150–400)
RBC: 3.71 MIL/uL — ABNORMAL LOW (ref 3.87–5.11)
RDW: 13.4 % (ref 11.5–15.5)
WBC Count: 5.7 10*3/uL (ref 4.0–10.5)
nRBC: 0 % (ref 0.0–0.2)

## 2020-10-21 LAB — PREGNANCY, URINE: Preg Test, Ur: NEGATIVE

## 2020-10-21 MED ORDER — SODIUM CHLORIDE 0.9 % IV SOLN
Freq: Once | INTRAVENOUS | Status: AC
Start: 2020-10-21 — End: 2020-10-21
  Filled 2020-10-21: qty 250

## 2020-10-21 MED ORDER — PALONOSETRON HCL INJECTION 0.25 MG/5ML
INTRAVENOUS | Status: AC
  Filled 2020-10-21: qty 5

## 2020-10-21 MED ORDER — SODIUM CHLORIDE 0.9 % IV SOLN
80.0000 mg/m2 | Freq: Once | INTRAVENOUS | Status: AC
  Administered 2020-10-21: 150 mg via INTRAVENOUS
  Filled 2020-10-21: qty 25

## 2020-10-21 MED ORDER — DIPHENHYDRAMINE HCL 50 MG/ML IJ SOLN
25.0000 mg | Freq: Once | INTRAMUSCULAR | Status: AC
  Administered 2020-10-21: 25 mg via INTRAVENOUS

## 2020-10-21 MED ORDER — SODIUM CHLORIDE 0.9% FLUSH
10.0000 mL | INTRAVENOUS | Status: DC | PRN
  Administered 2020-10-21: 10 mL
  Filled 2020-10-21: qty 10

## 2020-10-21 MED ORDER — FAMOTIDINE IN NACL 20-0.9 MG/50ML-% IV SOLN
20.0000 mg | Freq: Once | INTRAVENOUS | Status: AC
  Administered 2020-10-21: 20 mg via INTRAVENOUS

## 2020-10-21 MED ORDER — PALONOSETRON HCL INJECTION 0.25 MG/5ML
0.2500 mg | Freq: Once | INTRAVENOUS | Status: AC
  Administered 2020-10-21: 0.25 mg via INTRAVENOUS

## 2020-10-21 MED ORDER — SODIUM CHLORIDE 0.9% FLUSH
10.0000 mL | Freq: Once | INTRAVENOUS | Status: AC
  Administered 2020-10-21: 10 mL
  Filled 2020-10-21: qty 10

## 2020-10-21 MED ORDER — SODIUM CHLORIDE 0.9 % IV SOLN
10.0000 mg | Freq: Once | INTRAVENOUS | Status: AC
  Administered 2020-10-21: 10 mg via INTRAVENOUS
  Filled 2020-10-21: qty 10

## 2020-10-21 MED ORDER — FAMOTIDINE IN NACL 20-0.9 MG/50ML-% IV SOLN
INTRAVENOUS | Status: AC
  Filled 2020-10-21: qty 50

## 2020-10-21 MED ORDER — HEPARIN SOD (PORK) LOCK FLUSH 100 UNIT/ML IV SOLN
500.0000 [IU] | Freq: Once | INTRAVENOUS | Status: AC | PRN
Start: 2020-10-21 — End: 2020-10-21
  Administered 2020-10-21: 500 [IU]
  Filled 2020-10-21: qty 5

## 2020-10-21 MED ORDER — SODIUM CHLORIDE 0.9 % IV SOLN
255.0000 mg | Freq: Once | INTRAVENOUS | Status: AC
  Administered 2020-10-21: 260 mg via INTRAVENOUS
  Filled 2020-10-21: qty 26

## 2020-10-21 MED ORDER — DIPHENHYDRAMINE HCL 50 MG/ML IJ SOLN
INTRAMUSCULAR | Status: AC
  Filled 2020-10-21: qty 1

## 2020-10-21 NOTE — Patient Instructions (Signed)
[facility] Discharge Instructions for Patients Receiving Chemotherapy  Today you received the following chemotherapy agents: paclitaxel, carboplatin.  To help prevent nausea and vomiting after your treatment, we encourage you to take your nausea medication as directed.    If you develop nausea and vomiting that is not controlled by your nausea medication, call the clinic.   BELOW ARE SYMPTOMS THAT SHOULD BE REPORTED IMMEDIATELY:  *FEVER GREATER THAN 100.5 F  *CHILLS WITH OR WITHOUT FEVER  NAUSEA AND VOMITING THAT IS NOT CONTROLLED WITH YOUR NAUSEA MEDICATION  *UNUSUAL SHORTNESS OF BREATH  *UNUSUAL BRUISING OR BLEEDING  TENDERNESS IN MOUTH AND THROAT WITH OR WITHOUT PRESENCE OF ULCERS  *URINARY PROBLEMS  *BOWEL PROBLEMS  UNUSUAL RASH Items with * indicate a potential emergency and should be followed up as soon as possible.  Feel free to call the clinic should you have any questions or concerns. The clinic phone number is [phone].  Please show the CHEMO ALERT CARD at check-in to the Emergency Department and triage nurse.   

## 2020-10-22 ENCOUNTER — Telehealth: Payer: Self-pay | Admitting: Oncology

## 2020-10-22 NOTE — Telephone Encounter (Signed)
Scheduled appts per 2/17 los. Pt to refer to mychart for appts.

## 2020-10-26 ENCOUNTER — Other Ambulatory Visit: Payer: Self-pay

## 2020-10-26 DIAGNOSIS — Z171 Estrogen receptor negative status [ER-]: Secondary | ICD-10-CM

## 2020-10-26 DIAGNOSIS — C50211 Malignant neoplasm of upper-inner quadrant of right female breast: Secondary | ICD-10-CM

## 2020-10-27 ENCOUNTER — Ambulatory Visit (HOSPITAL_COMMUNITY)
Admission: RE | Admit: 2020-10-27 | Discharge: 2020-10-27 | Disposition: A | Discharge status: Home / Self Care | Payer: 59 | Source: Ambulatory Visit | Attending: Oncology | Admitting: Oncology

## 2020-10-27 ENCOUNTER — Encounter (HOSPITAL_COMMUNITY): Payer: Self-pay

## 2020-10-27 ENCOUNTER — Encounter (HOSPITAL_COMMUNITY)
Admission: RE | Admit: 2020-10-27 | Discharge: 2020-10-27 | Disposition: A | Discharge status: Home / Self Care | Payer: 59 | Source: Ambulatory Visit | Attending: Oncology | Admitting: Oncology

## 2020-10-27 ENCOUNTER — Other Ambulatory Visit: Payer: Self-pay

## 2020-10-27 DIAGNOSIS — Z171 Estrogen receptor negative status [ER-]: Secondary | ICD-10-CM

## 2020-10-27 DIAGNOSIS — C50211 Malignant neoplasm of upper-inner quadrant of right female breast: Secondary | ICD-10-CM | POA: Insufficient documentation

## 2020-10-27 DIAGNOSIS — C50411 Malignant neoplasm of upper-outer quadrant of right female breast: Secondary | ICD-10-CM | POA: Insufficient documentation

## 2020-10-27 MED ORDER — IOHEXOL 300 MG/ML  SOLN
75.0000 mL | Freq: Once | INTRAMUSCULAR | Status: AC | PRN
  Administered 2020-10-27: 75 mL via INTRAVENOUS

## 2020-10-27 MED ORDER — TECHNETIUM TC 99M MEDRONATE IV KIT: 20.0000 | PACK | Freq: Once | INTRAVENOUS | Status: DC | PRN

## 2020-10-27 MED ORDER — HEPARIN SOD (PORK) LOCK FLUSH 100 UNIT/ML IV SOLN
INTRAVENOUS | Status: AC
  Filled 2020-10-27: qty 5

## 2020-10-27 MED ORDER — HEPARIN SOD (PORK) LOCK FLUSH 100 UNIT/ML IV SOLN
500.0000 [IU] | Freq: Once | INTRAVENOUS | Status: AC
  Administered 2020-10-27: 500 [IU] via INTRAVENOUS

## 2020-10-27 NOTE — Progress Notes (Signed)
[place]  [phone]) [phone] [phone]     ID: Penny Brown DOB: 25-Jul-1971  MR#: [id number]  RKY#:[id number]  Patient Care Team: [name], NP as PCP - General (Family Medicine) [name], RN as Oncology Nurse Navigator [name], RN as Oncology Nurse Navigator Hassani Sliney, [name], MD as Consulting Physician (Oncology) [name], MD as Consulting Physician (General Surgery) [name], MD as Consulting Physician (Radiation Oncology) [name], MD OTHER MD:  CHIEF COMPLAINT: recurrent vs. new triple negative breast cancer  CURRENT TREATMENT: neoadjuvant chemotherapy   INTERVAL HISTORY: Penny Brown returns today for follow up and treatment of her second triple negative breast cancer.  She began neoadjuvant chemotherapy, consisting of pembrolizumab, carboplatin, and paclitaxel, on 10/14/2020.   Since her last visit, she underwent staging chest CT and bone scan yesterday, 10/27/2020.  Results are still pending but my reading of the bone scan at any rate is very favorable   REVIEW OF SYSTEMS: Chi did much better with this cycle.  She had no fatigue.  She had no neuropathy-like symptoms.  She had a little bit of nausea 1 time after she says she ate too fast.  She developed slight constipation on day 2 followed by about 2 hours of discordantly bowel movements.  She took an Imodium and that stopped.  Since then she has had a small not hard bowel movement most days.  Aside from that she wanted to know why she needs pregnancy testing.  She says that she has had her tubes tied on coils placed and has had unprotected sex for 14 years with no pregnancy.  She refuses further pregnancy testing at this point.   COVID 19 VACCINATION STATUS: fully vaccinated [employer]), with booster 06/2020   HISTORY OF CURRENT ILLNESS: From the original intake note:  Penny Brown has a history of triple negative right breast cancer  diagnosed in 2008, for which she underwent right lumpectomy, chemotherapy, and radiation therapy. She received 4 cycles of doxorubicin and cyclophosphamide followed by 4 cycles of of paclitaxel, which was discontinued secondary to neuropathy. She was released from follow up here in 2012.  More recently, she presented with a palpable upper-inner right breast mass. She underwent bilateral diagnostic mammography with tomography and right breast ultrasonography at [facility] on 09/22/2020 showing: breast density category A; 2.2 cm irregular mass in upper-inner right breast; no significant abnormalities in right axilla.  Accordingly on 09/22/2020 she proceeded to biopsy of the right breast area in question. The pathology from this procedure ([id number]) showed: invasive ductal carcinoma, grade 3. Prognostic indicators significant for: estrogen receptor, 0% negative and progesterone receptor, 0% negative. Proliferation marker Ki67 at 80%. HER2 negatve by immunohistochemistry (1+).  Cancer Staging Malignant neoplasm of upper-inner quadrant of right breast in female, estrogen receptor negative ([place]) Staging form: Breast, AJCC 8th Edition - Clinical stage from 09/29/2020: Stage IIB (rcT2, cN0, cM0, G3, ER-, PR-, HER2-) - Signed by [name], MD on 09/29/2020 Stage prefix: Recurrence  Malignant neoplasm of upper-outer quadrant of right breast in female, estrogen receptor negative ([place]) Staging form: Breast, AJCC 8th Edition - Clinical: cT2, cN1, cM0, ER-, PR-, HER2- - Signed by [name], MD on 09/29/2020 Stage prefix: Initial diagnosis Nuclear grade: G3  The patient's subsequent history is as detailed below.   PAST MEDICAL HISTORY: Past Medical History:  Diagnosis Date   Breast cancer [facility]) 2008   Right Breast Cancer   Cancer [facility])    Family history of  breast cancer    Family history of prostate cancer    Personal history of chemotherapy 2008   Right Breast Cancer   Personal  history of radiation therapy 2008   Right Breast Cancer    PAST SURGICAL HISTORY: Past Surgical History:  Procedure Laterality Date   BREAST LUMPECTOMY Right 2008   CESAREAN SECTION     PORTACATH PLACEMENT Left 10/13/2020   Procedure: INSERTION PORT-A-CATH;  Surgeon: [name], MD;  Location: [place];  Service: General;  Laterality: Left;  LEAVE PORT ACCESSED CHEMO STARTS ON 2/10, LEFT SIDE    FAMILY HISTORY: Family History  Problem Relation Age of Onset   Breast cancer Maternal Grandmother 78   Prostate cancer Maternal Grandfather        dx 23s, metastatic   Her father died from heart attack (possibly Covid?) at age 57. Her mother is age 3 as of 09/2020. Nayda as 2 brothers and 2 sisters. She reports breast cancer in her maternal grandmother in her late 60's.   GYNECOLOGIC HISTORY:  Patient's last menstrual period was 10/08/2020. Menarche: 50 years old Age at first live birth: 50 years old [place] P 3 LMP 09/09/2020, periods are regular, 7 days with 4 heavy Contraceptive: s/p BTL with coils in place HRT n/a  Hysterectomy? no BSO? 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 Husband Penny Brown is a Cabin crew. She lives at home with Penny Brown, daughter Penny Brown, age 75, and daughter Penny Brown, age 84 who works as a Curator. Son Penny Brown, age 27, is a Psychiatric nurse in [employer]. Shalinda has one grandchild. She attends [employer].    ADVANCED DIRECTIVES: In the absence of any documentation to the contrary, the patient's spouse is their HCPOA.    HEALTH MAINTENANCE: Social History   Tobacco Use   Smoking status: Never Smoker   Smokeless tobacco: Never Used  Substance Use Topics   Alcohol use: No   Drug use: No     Colonoscopy: n/a (age)   PAP: 2019  Bone density: n/a (age)   No Known Allergies  Current Outpatient Medications  Medication Sig  Dispense Refill   lidocaine-prilocaine (EMLA) cream Apply to affected area once 30 g 3   prochlorperazine (COMPAZINE) 10 MG tablet Take 1 tablet (10 mg total) by mouth every 6 (six) hours as needed (Nausea or vomiting). 30 tablet 1   traMADol (ULTRAM) 50 MG tablet Take 1 tablet (50 mg total) by mouth every 6 (six) hours as needed. 8 tablet 0   No current facility-administered medications for this visit.   Facility-Administered Medications Ordered in Other Visits  Medication Dose Route Frequency Provider Last Rate Last Admin   technetium medronate (TC-MDP) injection 20 millicurie  20 millicurie Intravenous Once PRN Abigail Miyamoto, MD        OBJECTIVE: African-American woman who appears younger than stated age  1:   10/28/20 1152  BP: 124/70  Pulse: 66  Resp: 18  Temp: 97.6 F (36.4 C)  SpO2: 100%     Body mass index is 30.86 kg/m.   Wt Readings from Last 3 Encounters:  10/28/20 168 lb 11.2 oz (76.5 kg)  10/21/20 169 lb 14.4 oz (77.1 kg)  10/14/20 168 lb 12.8 oz (76.6 kg)      ECOG FS:1 - Symptomatic but completely ambulatory  Sclerae unicteric, EOMs intact Wearing a mask No cervical or supraclavicular adenopathy Lungs no rales  or rhonchi Heart regular rate and rhythm Abd soft, nontender, positive bowel sounds MSK no focal spinal tenderness, no upper extremity lymphedema Neuro: nonfocal, well oriented, appropriate affect Breasts: The mass in the upper inner quadrant of the right breast is now barely palpable, softer and measuring perhaps a half a centimeter.   LAB RESULTS:  CMP     Component Value Date/Time   NA 136 10/21/2020 0934   K 3.8 10/21/2020 0934   CL 105 10/21/2020 0934   CO2 24 10/21/2020 0934   GLUCOSE 109 (H) 10/21/2020 0934   BUN 10 10/21/2020 0934   CREATININE 0.70 10/21/2020 0934   CALCIUM 8.7 (L) 10/21/2020 0934   PROT 7.1 10/21/2020 0934   ALBUMIN 3.5 10/21/2020 0934   AST 16 10/21/2020 0934   ALT 16 10/21/2020 0934   ALKPHOS 77  10/21/2020 0934   BILITOT 0.3 10/21/2020 0934   GFRNONAA >60 10/21/2020 0934   GFRAA  04/11/2007 1225    >60        The eGFR has been calculated using the MDRD equation. This calculation has not been validated in all clinical    No results found for: TOTALPROTELP, ALBUMINELP, A1GS, A2GS, BETS, BETA2SER, GAMS, MSPIKE, SPEI  Lab Results  Component Value Date   WBC 4.3 10/28/2020   NEUTROABS PENDING 10/28/2020   HGB 10.3 (L) 10/28/2020   HCT 31.8 (L) 10/28/2020   MCV 87.1 10/28/2020   PLT 295 10/28/2020    Lab Results  Component Value Date   LABCA2 17 11/25/2007    No components found for: [id number]  No results for input(s): INR in the last 168 hours.  Lab Results  Component Value Date   LABCA2 17 11/25/2007    No results found for: IFO277  No results found for: AJO878  No results found for: MVE720  No results found for: CA2729  No components found for: HGQUANT  No results found for: CEA1 / No results found for: CEA1   No results found for: AFPTUMOR  No results found for: CHROMOGRNA  No results found for: KPAFRELGTCHN, LAMBDASER, KAPLAMBRATIO (kappa/lambda light chains)  No results found for: HGBA, HGBA2QUANT, HGBFQUANT, HGBSQUAN (Hemoglobinopathy evaluation)   Lab Results  Component Value Date   LDH 156 11/25/2007    No results found for: IRON, TIBC, IRONPCTSAT (Iron and TIBC)  No results found for: FERRITIN  Urinalysis    Component Value Date/Time   LABSPEC 1.015 03/13/2017 1905   PHURINE 7.0 03/13/2017 1905   GLUCOSEU NEGATIVE 03/13/2017 1905   HGBUR MODERATE (A) 03/13/2017 1905   BILIRUBINUR NEGATIVE 03/13/2017 1905   KETONESUR NEGATIVE 03/13/2017 1905   PROTEINUR NEGATIVE 03/13/2017 1905   UROBILINOGEN 0.2 03/13/2017 1905   NITRITE NEGATIVE 03/13/2017 1905   LEUKOCYTESUR MODERATE (A) 03/13/2017 1905    STUDIES: DG Chest 1 View  Result Date: 10/13/2020 CLINICAL DATA:  Left-sided Port-A-Cath placement. EXAM: DG C-ARM 1-60 MIN;  CHEST  1 VIEW FLUOROSCOPY TIME:  Fluoroscopy Time:  24 seconds Reported radiation dose: 2.90 mGy COMPARISON:  None. FINDINGS: A single C-arm fluoroscopic images were obtained intraoperatively and submitted for post operative interpretation. This demonstrates a left IJ approach Port-A-Cath with the tip projecting at the superior aspect of the right atrium. No evidence of a catheter kink. Please see the performing provider's procedural report for further detail. IMPRESSION: Single intraoperative fluoroscopic image, as detailed above. Electronically Signed   By: Margaretha Sheffield MD   On: 10/13/2020 14:13   MR BREAST BILATERAL W WO CONTRAST INC CAD  Result Date: 10/11/2020 CLINICAL DATA:  50 year old female with newly diagnosed right breast cancer. LABS:  None performed on site. EXAM: BILATERAL BREAST MRI WITH AND WITHOUT CONTRAST TECHNIQUE: Multiplanar, multisequence MR images of both breasts were obtained prior to and following the intravenous administration of 7 ml of Gadavist. Three-dimensional MR images were rendered by post-processing of the original MR data on an independent workstation. The three-dimensional MR images were interpreted, and findings are reported in the following complete MRI report for this study. Three dimensional images were evaluated at the independent interpreting workstation using the DynaCAD thin client. COMPARISON:  Previous exam(s). FINDINGS: Breast composition: b. Scattered fibroglandular tissue. Background parenchymal enhancement: Mild. Right breast: Susceptibility artifact from post biopsy clip is seen in association with an irregular, enhancing mass in the upper inner quadrant of the right breast posteriorly (series 6, image 50/120). It measures 2.3 x 2.2 x 2.1 cm. This is consistent with the patient's biopsy-proven malignancy. No other suspicious mass or abnormal enhancement. Left breast: No suspicious mass or abnormal enhancement. Lymph nodes: No abnormal appearing lymph nodes.  Ancillary findings:  None. IMPRESSION: 1. 2.3 cm enhancing mass in the upper inner right breast consistent with the patient's biopsy-proven site of malignancy. No other suspicious findings in the remainder of the right breast. 2. No MRI evidence of malignancy on the left. 3. No suspicious lymphadenopathy. RECOMMENDATION: Per clinical treatment plan. BI-RADS CATEGORY  6: Known biopsy-proven malignancy. Electronically Signed   By: Kristopher Oppenheim M.D.   On: 10/11/2020 12:01   DG C-Arm 1-60 Min  Result Date: 10/13/2020 CLINICAL DATA:  Left-sided Port-A-Cath placement. EXAM: DG C-ARM 1-60 MIN; CHEST  1 VIEW FLUOROSCOPY TIME:  Fluoroscopy Time:  24 seconds Reported radiation dose: 2.90 mGy COMPARISON:  None. FINDINGS: A single C-arm fluoroscopic images were obtained intraoperatively and submitted for post operative interpretation. This demonstrates a left IJ approach Port-A-Cath with the tip projecting at the superior aspect of the right atrium. No evidence of a catheter kink. Please see the performing provider's procedural report for further detail. IMPRESSION: Single intraoperative fluoroscopic image, as detailed above. Electronically Signed   By: Margaretha Sheffield MD   On: 10/13/2020 14:13     ELIGIBLE FOR AVAILABLE RESEARCH PROTOCOL: no  ASSESSMENT: 50 y.o. [place] woman  (1) status post right breast upper outer quadrant lumpectomy and sentinel lymph node sampling 04/15/2007 for a pT2 pN1, stage IIIB invasive ductal carcinoma, grade 3, triple negative, with an MIB-1 of 59%  (a) adjuvant chemotherapy consisted of doxorubicin and cyclophosphamide in dose dense fashion x4 followed by weekly paclitaxel x12  (b) received adjuvant radiation  (c) a total of 4 right axillary lymph nodes were removed  (2) status post right breast upper inner quadrant biopsy 09/22/2020 for a clinical T2 N2, stage IIIC invasive ductal carcinoma, grade 3, triple negative, with an MIB-1 of 80%.  (3) genetics testing in  process  (4) adjuvant chemotherapy will consist of pembrolizumab given every 3 weeks, and carboplatin + paclitaxel given weekly, 3 weeks on, one week off, for 6 cycles as tolerated  (5) definitive surgery to follow  (6) continue pembrolizumab to total one year   PLAN:  [place] refused pregnancy testing today.  She tells me that she had her tubes tied and coils placed.  She has had unprotected sex with her husband since 2006 with no pregnancy.  I was not aware that she had her tubes tied.  We are canceling the weekly pregnancy tests  She did much better with the  second cycle.  I think she had a "first cycle effect" the first time but certainly no neuropathy at this point.  She is managing the mild constipation and mild loose stools very well.  She is continuing to work full-time.  On exam there already appears to be a significant change in the breast mass which is barely palpable now  She will return next week for Keytruda alone and then in 2 weeks to start cycle 2 of her chemo immunotherapy  Total encounter time 25 minutes.Sarajane Jews C. Homer Miller, MD 10/28/2020 12:02 PM Medical Oncology and Hematology [facility] [place], [place] [address] Tel. [phone]    Fax. [phone]   This document serves as a record of services personally performed by Lurline Del, MD. It was created on his behalf by Wilburn Mylar, a trained medical scribe. The creation of this record is based on the scribe's personal observations and the provider's statements to them.   I, Lurline Del MD, have reviewed the above documentation for accuracy and completeness, and I agree with the above.   *Total Encounter Time as defined by the Centers for Medicare and Medicaid Services includes, in addition to the face-to-face time of a patient visit (documented in the note above) non-face-to-face time: obtaining and reviewing outside history, ordering and reviewing medications, tests  or procedures, care coordination (communications with other health care professionals or caregivers) and documentation in the medical record.

## 2020-10-28 ENCOUNTER — Inpatient Hospital Stay: Payer: 59

## 2020-10-28 ENCOUNTER — Encounter: Payer: Self-pay | Admitting: *Deleted

## 2020-10-28 ENCOUNTER — Inpatient Hospital Stay (HOSPITAL_BASED_OUTPATIENT_CLINIC_OR_DEPARTMENT_OTHER): Payer: 59 | Admitting: Oncology

## 2020-10-28 ENCOUNTER — Other Ambulatory Visit: Payer: Self-pay

## 2020-10-28 VITALS — BP 124/70 | HR 66 | Temp 97.6°F | Resp 18 | Ht 62.0 in | Wt 168.7 lb

## 2020-10-28 DIAGNOSIS — C50211 Malignant neoplasm of upper-inner quadrant of right female breast: Secondary | ICD-10-CM

## 2020-10-28 DIAGNOSIS — Z171 Estrogen receptor negative status [ER-]: Secondary | ICD-10-CM

## 2020-10-28 LAB — CMP (CANCER CENTER ONLY)
ALT: 20 U/L (ref 0–44)
AST: 18 U/L (ref 15–41)
Albumin: 3.7 g/dL (ref 3.5–5.0)
Alkaline Phosphatase: 80 U/L (ref 38–126)
Anion gap: 6 (ref 5–15)
BUN: 9 mg/dL (ref 6–20)
CO2: 25 mmol/L (ref 22–32)
Calcium: 9 mg/dL (ref 8.9–10.3)
Chloride: 104 mmol/L (ref 98–111)
Creatinine: 0.71 mg/dL (ref 0.44–1.00)
GFR, Estimated: >60 mL/min (ref >60)
Glucose, Bld: 107 mg/dL — ABNORMAL HIGH (ref 70–99)
Potassium: 3.6 mmol/L (ref 3.5–5.1)
Sodium: 135 mmol/L (ref 135–145)
Total Bilirubin: 0.3 mg/dL (ref 0.3–1.2)
Total Protein: 7.1 g/dL (ref 6.5–8.1)

## 2020-10-28 LAB — CBC WITH DIFFERENTIAL (CANCER CENTER ONLY)
Abs Immature Granulocytes: 0.04 10*3/uL (ref 0.00–0.07)
Basophils Absolute: 0 10*3/uL (ref 0.0–0.1)
Basophils Relative: 1 %
Eosinophils Absolute: 0.1 10*3/uL (ref 0.0–0.5)
Eosinophils Relative: 2 %
HCT: 31.8 % — ABNORMAL LOW (ref 36.0–46.0)
Hemoglobin: 10.3 g/dL — ABNORMAL LOW (ref 12.0–15.0)
Immature Granulocytes: 1 %
Lymphocytes Relative: 45 %
Lymphs Abs: 1.9 10*3/uL (ref 0.7–4.0)
MCH: 28.2 pg (ref 26.0–34.0)
MCHC: 32.4 g/dL (ref 30.0–36.0)
MCV: 87.1 fL (ref 80.0–100.0)
Monocytes Absolute: 0.3 10*3/uL (ref 0.1–1.0)
Monocytes Relative: 6 %
Neutro Abs: 1.9 10*3/uL (ref 1.7–7.7)
Neutrophils Relative %: 45 %
Platelet Count: 295 10*3/uL (ref 150–400)
RBC: 3.65 MIL/uL — ABNORMAL LOW (ref 3.87–5.11)
RDW: 13.8 % (ref 11.5–15.5)
WBC Count: 4.3 10*3/uL (ref 4.0–10.5)
nRBC: 0 % (ref 0.0–0.2)

## 2020-10-28 MED ORDER — PALONOSETRON HCL INJECTION 0.25 MG/5ML
0.2500 mg | Freq: Once | INTRAVENOUS | Status: AC
  Administered 2020-10-28: 0.25 mg via INTRAVENOUS

## 2020-10-28 MED ORDER — DIPHENHYDRAMINE HCL 50 MG/ML IJ SOLN
25.0000 mg | Freq: Once | INTRAMUSCULAR | Status: AC
  Administered 2020-10-28: 25 mg via INTRAVENOUS

## 2020-10-28 MED ORDER — FAMOTIDINE IN NACL 20-0.9 MG/50ML-% IV SOLN
20.0000 mg | Freq: Once | INTRAVENOUS | Status: AC
  Administered 2020-10-28: 20 mg via INTRAVENOUS

## 2020-10-28 MED ORDER — FAMOTIDINE IN NACL 20-0.9 MG/50ML-% IV SOLN
INTRAVENOUS | Status: AC
  Filled 2020-10-28: qty 50

## 2020-10-28 MED ORDER — HEPARIN SOD (PORK) LOCK FLUSH 100 UNIT/ML IV SOLN
500.0000 [IU] | Freq: Once | INTRAVENOUS | Status: AC | PRN
  Administered 2020-10-28: 500 [IU]
  Filled 2020-10-28: qty 5

## 2020-10-28 MED ORDER — SODIUM CHLORIDE 0.9 % IV SOLN
Freq: Once | INTRAVENOUS | Status: AC
Start: 2020-10-28 — End: 2020-10-28
  Filled 2020-10-28: qty 250

## 2020-10-28 MED ORDER — DIPHENHYDRAMINE HCL 50 MG/ML IJ SOLN
INTRAMUSCULAR | Status: AC
  Filled 2020-10-28: qty 1

## 2020-10-28 MED ORDER — SODIUM CHLORIDE 0.9 % IV SOLN
10.0000 mg | Freq: Once | INTRAVENOUS | Status: AC
  Administered 2020-10-28: 10 mg via INTRAVENOUS
  Filled 2020-10-28: qty 10

## 2020-10-28 MED ORDER — SODIUM CHLORIDE 0.9 % IV SOLN
255.0000 mg | Freq: Once | INTRAVENOUS | Status: AC
  Administered 2020-10-28: 260 mg via INTRAVENOUS
  Filled 2020-10-28: qty 26

## 2020-10-28 MED ORDER — PALONOSETRON HCL INJECTION 0.25 MG/5ML
INTRAVENOUS | Status: AC
  Filled 2020-10-28: qty 5

## 2020-10-28 MED ORDER — SODIUM CHLORIDE 0.9 % IV SOLN
80.0000 mg/m2 | Freq: Once | INTRAVENOUS | Status: AC
  Administered 2020-10-28: 150 mg via INTRAVENOUS
  Filled 2020-10-28: qty 25

## 2020-10-28 MED ORDER — SODIUM CHLORIDE 0.9% FLUSH
10.0000 mL | INTRAVENOUS | Status: DC | PRN
  Administered 2020-10-28: 10 mL
  Filled 2020-10-28: qty 10

## 2020-10-28 NOTE — Patient Instructions (Signed)
[facility] Discharge Instructions for Patients Receiving Chemotherapy  Today you received the following chemotherapy agents: Paclitaxel (Taxol) and Carboplatin  To help prevent nausea and vomiting after your treatment, we encourage you to take your nausea medication  as prescribed.    If you develop nausea and vomiting that is not controlled by your nausea medication, call the clinic.   BELOW ARE SYMPTOMS THAT SHOULD BE REPORTED IMMEDIATELY:  *FEVER GREATER THAN 100.5 F  *CHILLS WITH OR WITHOUT FEVER  NAUSEA AND VOMITING THAT IS NOT CONTROLLED WITH YOUR NAUSEA MEDICATION  *UNUSUAL SHORTNESS OF BREATH  *UNUSUAL BRUISING OR BLEEDING  TENDERNESS IN MOUTH AND THROAT WITH OR WITHOUT PRESENCE OF ULCERS  *URINARY PROBLEMS  *BOWEL PROBLEMS  UNUSUAL RASH Items with * indicate a potential emergency and should be followed up as soon as possible.  Feel free to call the clinic should you have any questions or concerns. The clinic phone number is [phone].  Please show the CHEMO ALERT CARD at check-in to the Emergency Department and triage nurse.   

## 2020-10-29 ENCOUNTER — Telehealth: Payer: Self-pay | Admitting: Adult Health

## 2020-10-29 ENCOUNTER — Telehealth: Payer: Self-pay | Admitting: Oncology

## 2020-10-29 ENCOUNTER — Other Ambulatory Visit: Payer: Self-pay | Admitting: Oncology

## 2020-10-29 ENCOUNTER — Encounter: Payer: Self-pay | Admitting: *Deleted

## 2020-10-29 NOTE — Telephone Encounter (Signed)
Called patient and discussed scan results in detail.  She has numerous indeterminate 6mm and smaller pulmonary nodules.  Explained these could be from previous infection, but the size is to small to biopsy or to show up on PET scan.  Recommend repeating CT scan in 6 months to monitor.  Reviewed her right breast mass was present, but no other cancer on bone scan or lymph nodes/upper abdomen.  Patient and her husband voiced understanding and appreciation for the results.  Wilber Bihari, NP

## 2020-10-29 NOTE — Telephone Encounter (Signed)
Scheduled appts per 2/24 los. Pt to refer to mychart for new appts added.

## 2020-11-01 ENCOUNTER — Other Ambulatory Visit: Payer: Self-pay | Admitting: Pharmacist

## 2020-11-02 ENCOUNTER — Ambulatory Visit: Payer: 59 | Admitting: Oncology

## 2020-11-02 ENCOUNTER — Ambulatory Visit: Payer: 59

## 2020-11-02 ENCOUNTER — Other Ambulatory Visit: Payer: 59

## 2020-11-04 ENCOUNTER — Other Ambulatory Visit: Payer: 59

## 2020-11-04 ENCOUNTER — Inpatient Hospital Stay: Payer: 59 | Attending: Oncology

## 2020-11-04 ENCOUNTER — Inpatient Hospital Stay: Payer: 59

## 2020-11-04 ENCOUNTER — Ambulatory Visit: Payer: 59

## 2020-11-04 ENCOUNTER — Other Ambulatory Visit: Payer: Self-pay

## 2020-11-04 VITALS — BP 133/73 | HR 90 | Temp 97.8°F | Resp 18

## 2020-11-04 DIAGNOSIS — Z8041 Family history of malignant neoplasm of ovary: Secondary | ICD-10-CM | POA: Diagnosis not present

## 2020-11-04 DIAGNOSIS — Z171 Estrogen receptor negative status [ER-]: Secondary | ICD-10-CM

## 2020-11-04 DIAGNOSIS — Z803 Family history of malignant neoplasm of breast: Secondary | ICD-10-CM | POA: Insufficient documentation

## 2020-11-04 DIAGNOSIS — Z79899 Other long term (current) drug therapy: Secondary | ICD-10-CM | POA: Insufficient documentation

## 2020-11-04 DIAGNOSIS — C50211 Malignant neoplasm of upper-inner quadrant of right female breast: Secondary | ICD-10-CM

## 2020-11-04 DIAGNOSIS — K449 Diaphragmatic hernia without obstruction or gangrene: Secondary | ICD-10-CM | POA: Diagnosis not present

## 2020-11-04 DIAGNOSIS — Z5112 Encounter for antineoplastic immunotherapy: Secondary | ICD-10-CM | POA: Diagnosis not present

## 2020-11-04 DIAGNOSIS — Z95828 Presence of other vascular implants and grafts: Secondary | ICD-10-CM

## 2020-11-04 DIAGNOSIS — Z5111 Encounter for antineoplastic chemotherapy: Secondary | ICD-10-CM | POA: Insufficient documentation

## 2020-11-04 DIAGNOSIS — Z923 Personal history of irradiation: Secondary | ICD-10-CM | POA: Diagnosis not present

## 2020-11-04 DIAGNOSIS — R918 Other nonspecific abnormal finding of lung field: Secondary | ICD-10-CM | POA: Insufficient documentation

## 2020-11-04 LAB — CBC WITH DIFFERENTIAL/PLATELET
Abs Immature Granulocytes: 0.04 10*3/uL (ref 0.00–0.07)
Basophils Absolute: 0 10*3/uL (ref 0.0–0.1)
Basophils Relative: 0 %
Eosinophils Absolute: 0.1 10*3/uL (ref 0.0–0.5)
Eosinophils Relative: 1 %
HCT: 31.3 % — ABNORMAL LOW (ref 36.0–46.0)
Hemoglobin: 10 g/dL — ABNORMAL LOW (ref 12.0–15.0)
Immature Granulocytes: 1 %
Lymphocytes Relative: 42 %
Lymphs Abs: 2 10*3/uL (ref 0.7–4.0)
MCH: 28.1 pg (ref 26.0–34.0)
MCHC: 31.9 g/dL (ref 30.0–36.0)
MCV: 87.9 fL (ref 80.0–100.0)
Monocytes Absolute: 0.2 10*3/uL (ref 0.1–1.0)
Monocytes Relative: 5 %
Neutro Abs: 2.4 10*3/uL (ref 1.7–7.7)
Neutrophils Relative %: 51 %
Platelets: 312 10*3/uL (ref 150–400)
RBC: 3.56 MIL/uL — ABNORMAL LOW (ref 3.87–5.11)
RDW: 14.4 % (ref 11.5–15.5)
WBC: 4.8 10*3/uL (ref 4.0–10.5)
nRBC: 0 % (ref 0.0–0.2)

## 2020-11-04 LAB — COMPREHENSIVE METABOLIC PANEL
ALT: 22 U/L (ref 0–44)
AST: 19 U/L (ref 15–41)
Albumin: 3.7 g/dL (ref 3.5–5.0)
Alkaline Phosphatase: 76 U/L (ref 38–126)
Anion gap: 6 (ref 5–15)
BUN: 10 mg/dL (ref 6–20)
CO2: 23 mmol/L (ref 22–32)
Calcium: 8.7 mg/dL — ABNORMAL LOW (ref 8.9–10.3)
Chloride: 104 mmol/L (ref 98–111)
Creatinine, Ser: 0.75 mg/dL (ref 0.44–1.00)
GFR, Estimated: >60 mL/min (ref >60)
Glucose, Bld: 132 mg/dL — ABNORMAL HIGH (ref 70–99)
Potassium: 3.8 mmol/L (ref 3.5–5.1)
Sodium: 133 mmol/L — ABNORMAL LOW (ref 135–145)
Total Bilirubin: 0.2 mg/dL — ABNORMAL LOW (ref 0.3–1.2)
Total Protein: 6.9 g/dL (ref 6.5–8.1)

## 2020-11-04 LAB — TSH: TSH: 5.181 u[IU]/mL — ABNORMAL HIGH (ref 0.308–3.960)

## 2020-11-04 MED ORDER — SODIUM CHLORIDE 0.9% FLUSH
10.0000 mL | INTRAVENOUS | Status: DC | PRN
  Administered 2020-11-04: 10 mL
  Filled 2020-11-04: qty 10

## 2020-11-04 MED ORDER — SODIUM CHLORIDE 0.9 % IV SOLN
200.0000 mg | Freq: Once | INTRAVENOUS | Status: AC
  Administered 2020-11-04: 200 mg via INTRAVENOUS
  Filled 2020-11-04: qty 8

## 2020-11-04 MED ORDER — SODIUM CHLORIDE 0.9% FLUSH
10.0000 mL | Freq: Once | INTRAVENOUS | Status: AC
  Administered 2020-11-04: 10 mL
  Filled 2020-11-04: qty 10

## 2020-11-04 MED ORDER — HEPARIN SOD (PORK) LOCK FLUSH 100 UNIT/ML IV SOLN
500.0000 [IU] | Freq: Once | INTRAVENOUS | Status: AC | PRN
  Administered 2020-11-04: 500 [IU]
  Filled 2020-11-04: qty 5

## 2020-11-04 MED ORDER — SODIUM CHLORIDE 0.9 % IV SOLN
Freq: Once | INTRAVENOUS | Status: AC
  Filled 2020-11-04: qty 250

## 2020-11-04 NOTE — Patient Instructions (Signed)

## 2020-11-04 NOTE — Patient Instructions (Signed)

## 2020-11-05 ENCOUNTER — Telehealth: Payer: Self-pay

## 2020-11-05 NOTE — Telephone Encounter (Signed)
Contacted pt to let her know that I had sent a scheduling request for her appointments to be changed for her to have appointments no earlier than 11 am due to her work schedule. Pt made aware that her 11/11/20 appoint ment may not be able to be changed . Pt verbalized understanding.

## 2020-11-11 ENCOUNTER — Inpatient Hospital Stay: Payer: 59

## 2020-11-11 ENCOUNTER — Inpatient Hospital Stay (HOSPITAL_BASED_OUTPATIENT_CLINIC_OR_DEPARTMENT_OTHER): Payer: 59 | Admitting: Adult Health

## 2020-11-11 ENCOUNTER — Encounter: Payer: Self-pay | Admitting: *Deleted

## 2020-11-11 ENCOUNTER — Ambulatory Visit: Payer: 59 | Admitting: Adult Health

## 2020-11-11 ENCOUNTER — Other Ambulatory Visit: Payer: Self-pay

## 2020-11-11 ENCOUNTER — Other Ambulatory Visit: Payer: 59

## 2020-11-11 ENCOUNTER — Ambulatory Visit: Payer: 59

## 2020-11-11 ENCOUNTER — Encounter: Payer: Self-pay | Admitting: Adult Health

## 2020-11-11 VITALS — BP 116/75 | HR 60 | Temp 97.7°F | Resp 18 | Ht 62.0 in | Wt 167.4 lb

## 2020-11-11 DIAGNOSIS — Z95828 Presence of other vascular implants and grafts: Secondary | ICD-10-CM

## 2020-11-11 DIAGNOSIS — C50211 Malignant neoplasm of upper-inner quadrant of right female breast: Secondary | ICD-10-CM

## 2020-11-11 DIAGNOSIS — Z171 Estrogen receptor negative status [ER-]: Secondary | ICD-10-CM

## 2020-11-11 DIAGNOSIS — C50411 Malignant neoplasm of upper-outer quadrant of right female breast: Secondary | ICD-10-CM | POA: Diagnosis not present

## 2020-11-11 LAB — CBC WITH DIFFERENTIAL/PLATELET
Abs Immature Granulocytes: 0.04 10*3/uL (ref 0.00–0.07)
Basophils Absolute: 0 10*3/uL (ref 0.0–0.1)
Basophils Relative: 1 %
Eosinophils Absolute: 0.1 10*3/uL (ref 0.0–0.5)
Eosinophils Relative: 1 %
HCT: 31.6 % — ABNORMAL LOW (ref 36.0–46.0)
Hemoglobin: 10.1 g/dL — ABNORMAL LOW (ref 12.0–15.0)
Immature Granulocytes: 1 %
Lymphocytes Relative: 42 %
Lymphs Abs: 1.8 10*3/uL (ref 0.7–4.0)
MCH: 28.5 pg (ref 26.0–34.0)
MCHC: 32 g/dL (ref 30.0–36.0)
MCV: 89 fL (ref 80.0–100.0)
Monocytes Absolute: 0.5 10*3/uL (ref 0.1–1.0)
Monocytes Relative: 11 %
Neutro Abs: 1.9 10*3/uL (ref 1.7–7.7)
Neutrophils Relative %: 44 %
Platelets: 213 10*3/uL (ref 150–400)
RBC: 3.55 MIL/uL — ABNORMAL LOW (ref 3.87–5.11)
RDW: 14.6 % (ref 11.5–15.5)
WBC: 4.3 10*3/uL (ref 4.0–10.5)
nRBC: 0 % (ref 0.0–0.2)

## 2020-11-11 LAB — COMPREHENSIVE METABOLIC PANEL
ALT: 20 U/L (ref 0–44)
AST: 18 U/L (ref 15–41)
Albumin: 3.8 g/dL (ref 3.5–5.0)
Alkaline Phosphatase: 81 U/L (ref 38–126)
Anion gap: 6 (ref 5–15)
BUN: 11 mg/dL (ref 6–20)
CO2: 24 mmol/L (ref 22–32)
Calcium: 9 mg/dL (ref 8.9–10.3)
Chloride: 107 mmol/L (ref 98–111)
Creatinine, Ser: 0.74 mg/dL (ref 0.44–1.00)
GFR, Estimated: >60 mL/min (ref >60)
Glucose, Bld: 88 mg/dL (ref 70–99)
Potassium: 4.3 mmol/L (ref 3.5–5.1)
Sodium: 137 mmol/L (ref 135–145)
Total Bilirubin: 0.2 mg/dL — ABNORMAL LOW (ref 0.3–1.2)
Total Protein: 7.2 g/dL (ref 6.5–8.1)

## 2020-11-11 MED ORDER — PALONOSETRON HCL INJECTION 0.25 MG/5ML
INTRAVENOUS | Status: AC
  Filled 2020-11-11: qty 5

## 2020-11-11 MED ORDER — FAMOTIDINE IN NACL 20-0.9 MG/50ML-% IV SOLN
20.0000 mg | Freq: Once | INTRAVENOUS | Status: AC
  Administered 2020-11-11: 20 mg via INTRAVENOUS

## 2020-11-11 MED ORDER — FAMOTIDINE IN NACL 20-0.9 MG/50ML-% IV SOLN
INTRAVENOUS | Status: AC
  Filled 2020-11-11: qty 50

## 2020-11-11 MED ORDER — SODIUM CHLORIDE 0.9 % IV SOLN
Freq: Once | INTRAVENOUS | Status: AC
  Filled 2020-11-11: qty 250

## 2020-11-11 MED ORDER — DIPHENHYDRAMINE HCL 50 MG/ML IJ SOLN
INTRAMUSCULAR | Status: AC
  Filled 2020-11-11: qty 1

## 2020-11-11 MED ORDER — DIPHENHYDRAMINE HCL 50 MG/ML IJ SOLN
25.0000 mg | Freq: Once | INTRAMUSCULAR | Status: AC
Start: 2020-11-11 — End: 2020-11-11
  Administered 2020-11-11: 25 mg via INTRAVENOUS

## 2020-11-11 MED ORDER — SODIUM CHLORIDE 0.9% FLUSH
10.0000 mL | Freq: Once | INTRAVENOUS | Status: AC
  Administered 2020-11-11: 10 mL
  Filled 2020-11-11: qty 10

## 2020-11-11 MED ORDER — HEPARIN SOD (PORK) LOCK FLUSH 100 UNIT/ML IV SOLN
500.0000 [IU] | Freq: Once | INTRAVENOUS | Status: AC | PRN
  Administered 2020-11-11: 500 [IU]
  Filled 2020-11-11: qty 5

## 2020-11-11 MED ORDER — SODIUM CHLORIDE 0.9% FLUSH
10.0000 mL | INTRAVENOUS | Status: DC | PRN
  Administered 2020-11-11: 10 mL
  Filled 2020-11-11: qty 10

## 2020-11-11 MED ORDER — SODIUM CHLORIDE 0.9 % IV SOLN
255.0000 mg | Freq: Once | INTRAVENOUS | Status: AC
  Administered 2020-11-11: 260 mg via INTRAVENOUS
  Filled 2020-11-11: qty 26

## 2020-11-11 MED ORDER — PACLITAXEL CHEMO INJECTION 300 MG/50ML
80.0000 mg/m2 | Freq: Once | INTRAVENOUS | Status: AC
  Administered 2020-11-11: 150 mg via INTRAVENOUS
  Filled 2020-11-11: qty 25

## 2020-11-11 MED ORDER — SODIUM CHLORIDE 0.9 % IV SOLN
20.0000 mg | Freq: Once | INTRAVENOUS | Status: AC
  Administered 2020-11-11: 20 mg via INTRAVENOUS
  Filled 2020-11-11: qty 20

## 2020-11-11 MED ORDER — PALONOSETRON HCL INJECTION 0.25 MG/5ML
0.2500 mg | Freq: Once | INTRAVENOUS | Status: AC
  Administered 2020-11-11: 0.25 mg via INTRAVENOUS

## 2020-11-11 NOTE — Progress Notes (Signed)
[place]  [phone] Fax:([phone]) [phone]     ID: Penny Brown DOB: [birth year]  MR#: [id number]  OIB#:[id number]  Patient Care Team: Penny Hawks, NP as PCP - General (Family Medicine) Penny Kaufmann, RN as Oncology Nurse Navigator Penny Germany, RN as Oncology Nurse Navigator Magrinat, [name], MD as Consulting Physician (Oncology) Penny Bookbinder, MD as Consulting Physician (General Surgery) Penny Rudd, MD as Consulting Physician (Radiation Oncology) Penny Dock, NP OTHER MD:  CHIEF COMPLAINT: recurrent vs. new triple negative breast cancer  CURRENT TREATMENT: neoadjuvant chemotherapy   INTERVAL HISTORY: Penny Brown returns today for follow up and treatment of her second triple negative breast cancer.  She began neoadjuvant chemotherapy, consisting of pembrolizumab, carboplatin, and paclitaxel, on 10/14/2020.   Today she is here to receive cycle two of her treatment.  She is doing quite well today.  She specifically denies peripheral neuroapthy.     REVIEW OF SYSTEMS: Penny Brown notes that she is fatigued, but then at night is restless.  She wants to know what to do for this.  She is working, and she is at home with her husband, 23 year old daughter, and 98 year old daughter.  She notes her 84 year old daughter is taking about moving out this year.    Penny Brown denies any new issues and says that she has figured out how to manage her nausea and her bowel.  Otherwise, a detailed ROS Was non contributory.      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 She received 4 cycles of doxorubicin and cyclophosphamide followed by 4 cycles of of paclitaxel, which was discontinued  secondary to neuropathy. She was released from follow up here in 2012.  More recently, she presented with a palpable upper-inner right breast mass. She underwent bilateral diagnostic mammography with tomography and right breast ultrasonography at [facility] on 09/22/2020 showing: breast density category A; 2.2 cm irregular mass in upper-inner right breast; no significant abnormalities in right axilla.  Accordingly on 09/22/2020 she proceeded to biopsy of the right breast area in question. The pathology from this procedure ([id number]) showed: invasive ductal carcinoma, grade 3. Prognostic indicators significant for: estrogen receptor, 0% negative and progesterone receptor, 0% negative. Proliferation marker Ki67 at 80%. HER2 negatve by immunohistochemistry (1+).  Cancer Staging Malignant neoplasm of upper-inner quadrant of right breast in female, estrogen receptor negative ([place]) Staging form: Breast, AJCC 8th Edition - Clinical stage from 09/29/2020: Stage IIB (rcT2, cN0, cM0, G3, ER-, PR-, HER2-) - Signed by Penny Cruel, MD on 09/29/2020 Stage prefix: Recurrence  Malignant neoplasm of upper-outer quadrant of right breast in female, estrogen receptor negative ([place]) Staging form: Breast, AJCC 8th Edition - Clinical: cT2, cN1, cM0, ER-, PR-, HER2- - Signed by Penny Cruel, MD on 09/29/2020 Stage prefix: Initial diagnosis Nuclear grade: G3  The patient's subsequent history is as detailed below.   PAST MEDICAL HISTORY: Past Medical History:  Diagnosis Date  . Breast cancer [facility]) 2008   Right Breast Cancer  . Cancer ([place])   . Family history of breast cancer   . Family history of prostate cancer   . Personal history of chemotherapy 2008   Right Breast Cancer  . Personal history of radiation therapy 2008   Right  Breast Cancer    PAST SURGICAL HISTORY: Past Surgical History:  Procedure Laterality Date  . BREAST LUMPECTOMY Right 2008  . CESAREAN SECTION    . PORTACATH PLACEMENT Left  10/13/2020   Procedure: INSERTION PORT-A-CATH;  Surgeon: Penny Bookbinder, MD;  Location: [facility];  Service: General;  Laterality: Left;  LEAVE PORT ACCESSED CHEMO STARTS ON 2/10, LEFT SIDE    FAMILY HISTORY: Family History  Problem Relation Age of Onset  . Breast cancer Maternal Grandmother 78  . Prostate cancer Maternal Grandfather        dx 72s, metastatic   Her father died from heart attack (possibly Covid?) at age 66. Her mother is age 54 as of 09/2020. Penny Brown as 2 brothers and 2 sisters. She reports breast cancer in her maternal grandmother in her late 82's.   GYNECOLOGIC HISTORY:  No LMP recorded. Menarche: 50 years old Age at first live birth: 50 years old [place] P 3 LMP 09/09/2020, periods are regular, 7 days with 4 heavy Contraceptive: s/p BTL with coils in place HRT n/a  Hysterectomy? no BSO? 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 She works from home. Husband Penny Brown is a Cabin crew. She lives at home with Penny Brown, daughter Penny Brown, age 25, and daughter Penny Brown, age 69 who works as a Curator. Son Penny Brown, age 5, is a Psychiatric nurse in [employer]. [name] has one grandchild. She attends [employer].    ADVANCED DIRECTIVES: In the absence of any documentation to the contrary, the patient's spouse is their HCPOA.    HEALTH MAINTENANCE: Social History   Tobacco Use  . Smoking status: Never Smoker  . Smokeless tobacco: Never Used  Substance Use Topics  . Alcohol use: No  . Drug use: No     Colonoscopy: n/a (age)   PAP: 2019  Bone density: n/a (age)   No Known Allergies  Current Outpatient Medications  Medication Sig Dispense Refill  . lidocaine-prilocaine (EMLA) cream Apply to affected area once 30 g 3  . prochlorperazine (COMPAZINE) 10 MG tablet Take 1 tablet (10 mg total) by mouth every 6 (six) hours as needed (Nausea or vomiting). 30 tablet 1  . traMADol (ULTRAM) 50  MG tablet Take 1 tablet (50 mg total) by mouth every 6 (six) hours as needed. 8 tablet 0   No current facility-administered medications for this visit.    OBJECTIVE: African-American woman who appears younger than stated age  29:   11/11/20 1331  BP: 116/75  Pulse: 60  Resp: 18  Temp: 97.7 F (36.5 C)  SpO2: 100%     Body mass index is 30.62 kg/m.   Wt Readings from Last 3 Encounters:  11/11/20 167 lb 6.4 oz (75.9 kg)  10/28/20 168 lb 11.2 oz (76.5 kg)  10/21/20 169 lb 14.4 oz (77.1 kg)      ECOG FS:1 - Symptomatic but completely ambulatory GENERAL: Patient is a well appearing female in no acute distress HEENT:  Sclerae anicteric.  Mask in place.   Neck is supple.  NODES:  No cervical, supraclavicular, or axillary lymphadenopathy palpated.  BREAST EXAM:  Deferred. LUNGS:  Clear to auscultation bilaterally.  No wheezes or rhonchi. HEART:  Regular rate and rhythm. No murmur appreciated. ABDOMEN:  Soft, nontender.  Positive, normoactive bowel sounds. No organomegaly palpated. MSK:  No focal spinal tenderness to palpation. Full range of motion bilaterally in the upper extremities. EXTREMITIES:  No peripheral  edema.   SKIN:  Clear with no obvious rashes or skin changes. No nail dyscrasia. NEURO:  Nonfocal. Well oriented.  Appropriate affect.     LAB RESULTS:  CMP     Component Value Date/Time   NA 133 (L) 11/04/2020 1230   K 3.8 11/04/2020 1230   CL 104 11/04/2020 1230   CO2 23 11/04/2020 1230   GLUCOSE 132 (H) 11/04/2020 1230   BUN 10 11/04/2020 1230   CREATININE 0.75 11/04/2020 1230   CREATININE 0.71 10/28/2020 1135   CALCIUM 8.7 (L) 11/04/2020 1230   PROT 6.9 11/04/2020 1230   ALBUMIN 3.7 11/04/2020 1230   AST 19 11/04/2020 1230   AST 18 10/28/2020 1135   ALT 22 11/04/2020 1230   ALT 20 10/28/2020 1135   ALKPHOS 76 11/04/2020 1230   BILITOT 0.2 (L) 11/04/2020 1230   BILITOT 0.3 10/28/2020 1135   GFRNONAA >60 11/04/2020 1230   GFRNONAA >60 10/28/2020  1135   GFRAA  04/11/2007 1225    >60        The eGFR has been calculated using the MDRD equation. This calculation has not been validated in all clinical    No results found for: TOTALPROTELP, ALBUMINELP, A1GS, A2GS, BETS, BETA2SER, GAMS, MSPIKE, SPEI  Lab Results  Component Value Date   WBC 4.3 11/11/2020   NEUTROABS 1.9 11/11/2020   HGB 10.1 (L) 11/11/2020   HCT 31.6 (L) 11/11/2020   MCV 89.0 11/11/2020   PLT 213 11/11/2020    Lab Results  Component Value Date   LABCA2 17 11/25/2007    No components found for: NIOEVO350  No results for input(s): INR in the last 168 hours.  Lab Results  Component Value Date   LABCA2 17 11/25/2007    No results found for: KXF818  No results found for: EXH371  No results found for: IRC789  No results found for: CA2729  No components found for: HGQUANT  No results found for: CEA1 / No results found for: CEA1   No results found for: AFPTUMOR  No results found for: CHROMOGRNA  No results found for: KPAFRELGTCHN, LAMBDASER, KAPLAMBRATIO (kappa/lambda light chains)  No results found for: HGBA, HGBA2QUANT, HGBFQUANT, HGBSQUAN (Hemoglobinopathy evaluation)   Lab Results  Component Value Date   LDH 156 11/25/2007    No results found for: IRON, TIBC, IRONPCTSAT (Iron and TIBC)  No results found for: FERRITIN  Urinalysis    Component Value Date/Time   LABSPEC 1.015 03/13/2017 1905   PHURINE 7.0 03/13/2017 1905   GLUCOSEU NEGATIVE 03/13/2017 1905   HGBUR MODERATE (A) 03/13/2017 1905   BILIRUBINUR NEGATIVE 03/13/2017 1905   KETONESUR NEGATIVE 03/13/2017 1905   PROTEINUR NEGATIVE 03/13/2017 1905   UROBILINOGEN 0.2 03/13/2017 1905   NITRITE NEGATIVE 03/13/2017 1905   LEUKOCYTESUR MODERATE (A) 03/13/2017 1905    STUDIES: DG Chest 1 View  Result Date: 10/13/2020 CLINICAL DATA:  Left-sided Port-A-Cath placement. EXAM: DG C-ARM 1-60 MIN; CHEST  1 VIEW FLUOROSCOPY TIME:  Fluoroscopy Time:  24 seconds Reported radiation  dose: 2.90 mGy COMPARISON:  None. FINDINGS: A single C-arm fluoroscopic images were obtained intraoperatively and submitted for post operative interpretation. This demonstrates a left IJ approach Port-A-Cath with the tip projecting at the superior aspect of the right atrium. No evidence of a catheter kink. Please see the performing provider's procedural report for further detail. IMPRESSION: Single intraoperative fluoroscopic image, as detailed above. Electronically Signed   By: Margaretha Sheffield MD   On: 10/13/2020 14:13   CT Chest  W Contrast  Result Date: 10/28/2020 CLINICAL DATA:  Triple negative breast cancer, chest staging EXAM: CT CHEST WITH CONTRAST TECHNIQUE: Multidetector CT imaging of the chest was performed during intravenous contrast administration. CONTRAST:  29m OMNIPAQUE IOHEXOL 300 MG/ML  SOLN COMPARISON:  07/16/2007 FINDINGS: Cardiovascular: Left chest port catheter. Normal heart size. No pericardial effusion. Mediastinum/Nodes: No enlarged mediastinal, hilar, or axillary lymph nodes. Small hiatal hernia. Thyroid gland, trachea, and esophagus demonstrate no significant findings. Lungs/Pleura: There are numerous tiny pulmonary nodules throughout the lungs, for example a 3 mm nodule of the superior segment left lower lobe (series 7, image 57) and a 2 mm nodule of the right apex (series 7, image 28). No pleural effusion or pneumothorax. Upper Abdomen: No acute abnormality. Musculoskeletal: No suspicious bone lesions identified. Spiculated appearing mass of the medial right breast containing a biopsy marking clip (series 2, image 56). Surgical clip in the right axilla (series 2, image 46). IMPRESSION: 1. Spiculated appearing mass of the medial right breast containing a biopsy marking clip. Surgical clip in the right axilla. Findings are in keeping with known primary breast malignancy. 2. There are numerous tiny pulmonary nodules throughout the lungs, measuring 3 mm and smaller, nonspecific.  Differential considerations include both sequelae of prior infection and metastatic disease. These are new compared to remote prior examination dated 07/16/2007. Comparison to more recent prior imaging of the chest, if available, would be helpful to assess for stability. Electronically Signed   By: AEddie CandleM.D.   On: 10/28/2020 15:28   NM Bone Scan Whole Body  Result Date: 10/29/2020 CLINICAL DATA:  Breast carcinoma EXAM: NUCLEAR MEDICINE WHOLE BODY BONE SCAN TECHNIQUE: Whole body anterior and posterior images were obtained approximately 3 hours after intravenous injection of radiopharmaceutical. RADIOPHARMACEUTICALS:  19.0 mCi Technetium-978mDP IV COMPARISON:  Prior bone scan examination May 20, 2007. Chest CT October 27, 2020 FINDINGS: Radiotracer uptake in the bony structures is unremarkable. There are no findings indicative of bony metastatic disease. Kidneys are noted in the flank positions bilaterally. IMPRESSION: No bony metastatic disease evident. Electronically Signed   By: WiLowella GripII M.D.   On: 10/29/2020 10:36   DG C-Arm 1-60 Min  Result Date: 10/13/2020 CLINICAL DATA:  Left-sided Port-A-Cath placement. EXAM: DG C-ARM 1-60 MIN; CHEST  1 VIEW FLUOROSCOPY TIME:  Fluoroscopy Time:  24 seconds Reported radiation dose: 2.90 mGy COMPARISON:  None. FINDINGS: A single C-arm fluoroscopic images were obtained intraoperatively and submitted for post operative interpretation. This demonstrates a left IJ approach Port-A-Cath with the tip projecting at the superior aspect of the right atrium. No evidence of a catheter kink. Please see the performing provider's procedural report for further detail. IMPRESSION: Single intraoperative fluoroscopic image, as detailed above. Electronically Signed   By: FrMargaretha SheffieldD   On: 10/13/2020 14:13     ELIGIBLE FOR AVAILABLE RESEARCH PROTOCOL: no  ASSESSMENT: [age].o. [place] woman  (1) status post right breast upper outer quadrant  lumpectomy and sentinel lymph node sampling 04/15/2007 for a pT2 pN1, stage IIIB invasive ductal carcinoma, grade 3, triple negative, with an MIB-1 of 59%  (a) adjuvant chemotherapy consisted of doxorubicin and cyclophosphamide in dose dense fashion x4 followed by weekly paclitaxel x12  (b) received adjuvant radiation  (c) a total of 4 right axillary lymph nodes were removed  (2) status post right breast upper inner quadrant biopsy 09/22/2020 for a clinical T2 N2, stage IIIC invasive ductal carcinoma, grade 3, triple negative, with an MIB-1 of 80%.  (  3) genetics testing in process  (4) neoadjuvant chemotherapy will consist of pembrolizumab given every 3 weeks, and carboplatin + paclitaxel given weekly, 3 weeks on, one week off, for 6 cycles as tolerated  (5) definitive surgery to follow  (6) continue pembrolizumab to total one year   PLAN:  Veronia is here today to continue neoadjuvant chemotherapy with her Paclitaxel and Carboplatin.  She is tolerating this well and will continue this.  Her labs today are stable and I reviewed them with her in detail.  Her tumor is now difficult to palpate suggesting a clinical response to treatment.    We discussed her fatigue and restlessness.  I suggested small amounts of exercise each day to help combat fatigue and may also help her sleep better at night.  She plans on trying this.   She has no peripheral neuropathy at this point, however knows to let us know if she develops this, in particular on a day that she doesn't have an appointment with myself or Dr. Jana Hakim.    We will see her back in one week for labs and treatment and in 2 weeks for labs, f/u with Dr. Jana Hakim and treatment.  She knows to call for any questions that may arise between now and her next appointment.  We are happy to see her sooner if needed.   Total encounter time 20 minutes.Wilber Bihari, NP 11/11/20 2:18 PM Medical Oncology and Hematology [facility] [place],  [id number] Tel. [phone]    Fax. [phone]   *Total Encounter Time as defined by the Centers for Medicare and Medicaid Services includes, in addition to the face-to-face time of a patient visit (documented in the note above) non-face-to-face time: obtaining and reviewing outside history, ordering and reviewing medications, tests or procedures, care coordination (communications with other health care professionals or caregivers) and documentation in the medical record.

## 2020-11-11 NOTE — Patient Instructions (Signed)
[facility] Discharge Instructions for Patients Receiving Chemotherapy  Today you received the following chemotherapy agents: Paclitaxel (Taxol) and Carboplatin  To help prevent nausea and vomiting after your treatment, we encourage you to take your nausea medication  as prescribed.    If you develop nausea and vomiting that is not controlled by your nausea medication, call the clinic.   BELOW ARE SYMPTOMS THAT SHOULD BE REPORTED IMMEDIATELY:  *FEVER GREATER THAN 100.5 F  *CHILLS WITH OR WITHOUT FEVER  NAUSEA AND VOMITING THAT IS NOT CONTROLLED WITH YOUR NAUSEA MEDICATION  *UNUSUAL SHORTNESS OF BREATH  *UNUSUAL BRUISING OR BLEEDING  TENDERNESS IN MOUTH AND THROAT WITH OR WITHOUT PRESENCE OF ULCERS  *URINARY PROBLEMS  *BOWEL PROBLEMS  UNUSUAL RASH Items with * indicate a potential emergency and should be followed up as soon as possible.  Feel free to call the clinic should you have any questions or concerns. The clinic phone number is [phone].  Please show the CHEMO ALERT CARD at check-in to the Emergency Department and triage nurse.   

## 2020-11-11 NOTE — Patient Instructions (Signed)

## 2020-11-18 ENCOUNTER — Inpatient Hospital Stay: Payer: 59

## 2020-11-18 ENCOUNTER — Other Ambulatory Visit: Payer: 59

## 2020-11-18 ENCOUNTER — Other Ambulatory Visit: Payer: Self-pay | Admitting: Oncology

## 2020-11-18 ENCOUNTER — Ambulatory Visit: Payer: 59

## 2020-11-18 ENCOUNTER — Other Ambulatory Visit: Payer: Self-pay

## 2020-11-18 VITALS — BP 127/85 | HR 113 | Temp 98.4°F | Resp 18 | Wt 168.0 lb

## 2020-11-18 DIAGNOSIS — Z171 Estrogen receptor negative status [ER-]: Secondary | ICD-10-CM

## 2020-11-18 DIAGNOSIS — Z95828 Presence of other vascular implants and grafts: Secondary | ICD-10-CM

## 2020-11-18 DIAGNOSIS — C50211 Malignant neoplasm of upper-inner quadrant of right female breast: Secondary | ICD-10-CM

## 2020-11-18 LAB — COMPREHENSIVE METABOLIC PANEL
ALT: 18 U/L (ref 0–44)
AST: 16 U/L (ref 15–41)
Albumin: 3.9 g/dL (ref 3.5–5.0)
Alkaline Phosphatase: 85 U/L (ref 38–126)
Anion gap: 5 (ref 5–15)
BUN: 13 mg/dL (ref 6–20)
CO2: 26 mmol/L (ref 22–32)
Calcium: 9.3 mg/dL (ref 8.9–10.3)
Chloride: 105 mmol/L (ref 98–111)
Creatinine, Ser: 0.78 mg/dL (ref 0.44–1.00)
GFR, Estimated: >60 mL/min (ref >60)
Glucose, Bld: 105 mg/dL — ABNORMAL HIGH (ref 70–99)
Potassium: 4.2 mmol/L (ref 3.5–5.1)
Sodium: 136 mmol/L (ref 135–145)
Total Bilirubin: 0.3 mg/dL (ref 0.3–1.2)
Total Protein: 7.5 g/dL (ref 6.5–8.1)

## 2020-11-18 LAB — CBC WITH DIFFERENTIAL/PLATELET
Abs Immature Granulocytes: 0.03 10*3/uL (ref 0.00–0.07)
Basophils Absolute: 0 10*3/uL (ref 0.0–0.1)
Basophils Relative: 0 %
Eosinophils Absolute: 0.1 10*3/uL (ref 0.0–0.5)
Eosinophils Relative: 1 %
HCT: 31.6 % — ABNORMAL LOW (ref 36.0–46.0)
Hemoglobin: 10.2 g/dL — ABNORMAL LOW (ref 12.0–15.0)
Immature Granulocytes: 1 %
Lymphocytes Relative: 37 %
Lymphs Abs: 1.8 10*3/uL (ref 0.7–4.0)
MCH: 28.2 pg (ref 26.0–34.0)
MCHC: 32.3 g/dL (ref 30.0–36.0)
MCV: 87.3 fL (ref 80.0–100.0)
Monocytes Absolute: 0.2 10*3/uL (ref 0.1–1.0)
Monocytes Relative: 4 %
Neutro Abs: 2.8 10*3/uL (ref 1.7–7.7)
Neutrophils Relative %: 57 %
Platelets: 185 10*3/uL (ref 150–400)
RBC: 3.62 MIL/uL — ABNORMAL LOW (ref 3.87–5.11)
RDW: 14.2 % (ref 11.5–15.5)
WBC: 4.8 10*3/uL (ref 4.0–10.5)
nRBC: 0 % (ref 0.0–0.2)

## 2020-11-18 LAB — TSH: TSH: 2.334 u[IU]/mL (ref 0.308–3.960)

## 2020-11-18 MED ORDER — SODIUM CHLORIDE 0.9 % IV SOLN
Freq: Once | INTRAVENOUS | Status: AC
  Filled 2020-11-18: qty 250

## 2020-11-18 MED ORDER — SODIUM CHLORIDE 0.9 % IV SOLN
80.0000 mg/m2 | Freq: Once | INTRAVENOUS | Status: AC
  Administered 2020-11-18: 150 mg via INTRAVENOUS
  Filled 2020-11-18: qty 25

## 2020-11-18 MED ORDER — SODIUM CHLORIDE 0.9% FLUSH
10.0000 mL | INTRAVENOUS | Status: DC | PRN
  Administered 2020-11-18: 10 mL
  Filled 2020-11-18: qty 10

## 2020-11-18 MED ORDER — SODIUM CHLORIDE 0.9% FLUSH
10.0000 mL | Freq: Once | INTRAVENOUS | Status: AC
  Administered 2020-11-18: 10 mL
  Filled 2020-11-18: qty 10

## 2020-11-18 MED ORDER — FAMOTIDINE IN NACL 20-0.9 MG/50ML-% IV SOLN
INTRAVENOUS | Status: AC
  Filled 2020-11-18: qty 50

## 2020-11-18 MED ORDER — HEPARIN SOD (PORK) LOCK FLUSH 100 UNIT/ML IV SOLN
500.0000 [IU] | Freq: Once | INTRAVENOUS | Status: AC | PRN
  Administered 2020-11-18: 500 [IU]
  Filled 2020-11-18: qty 5

## 2020-11-18 MED ORDER — PALONOSETRON HCL INJECTION 0.25 MG/5ML
INTRAVENOUS | Status: AC
  Filled 2020-11-18: qty 5

## 2020-11-18 MED ORDER — DIPHENHYDRAMINE HCL 50 MG/ML IJ SOLN
25.0000 mg | Freq: Once | INTRAMUSCULAR | Status: AC
Start: 2020-11-18 — End: 2020-11-18
  Administered 2020-11-18: 25 mg via INTRAVENOUS

## 2020-11-18 MED ORDER — PALONOSETRON HCL INJECTION 0.25 MG/5ML
0.2500 mg | Freq: Once | INTRAVENOUS | Status: AC
  Administered 2020-11-18: 0.25 mg via INTRAVENOUS

## 2020-11-18 MED ORDER — SODIUM CHLORIDE 0.9 % IV SOLN
255.0000 mg | Freq: Once | INTRAVENOUS | Status: AC
  Administered 2020-11-18: 260 mg via INTRAVENOUS
  Filled 2020-11-18: qty 26

## 2020-11-18 MED ORDER — DIPHENHYDRAMINE HCL 50 MG/ML IJ SOLN
INTRAMUSCULAR | Status: AC
  Filled 2020-11-18: qty 1

## 2020-11-18 MED ORDER — SODIUM CHLORIDE 0.9 % IV SOLN
10.0000 mg | Freq: Once | INTRAVENOUS | Status: AC
  Administered 2020-11-18: 10 mg via INTRAVENOUS
  Filled 2020-11-18: qty 10

## 2020-11-18 MED ORDER — FAMOTIDINE IN NACL 20-0.9 MG/50ML-% IV SOLN
20.0000 mg | Freq: Once | INTRAVENOUS | Status: AC
  Administered 2020-11-18: 20 mg via INTRAVENOUS

## 2020-11-18 NOTE — Progress Notes (Unsigned)
Per MD: Pt ok for treatment today with HR of 113.

## 2020-11-18 NOTE — Progress Notes (Signed)
To treat with HR of 113 per Lurline Del MD.

## 2020-11-18 NOTE — Patient Instructions (Signed)
[facility] Discharge Instructions for Patients Receiving Chemotherapy  Today you received the following chemotherapy agents: Paclitaxel (Taxol) and Carboplatin  To help prevent nausea and vomiting after your treatment, we encourage you to take your nausea medication  as prescribed.    If you develop nausea and vomiting that is not controlled by your nausea medication, call the clinic.   BELOW ARE SYMPTOMS THAT SHOULD BE REPORTED IMMEDIATELY:  *FEVER GREATER THAN 100.5 F  *CHILLS WITH OR WITHOUT FEVER  NAUSEA AND VOMITING THAT IS NOT CONTROLLED WITH YOUR NAUSEA MEDICATION  *UNUSUAL SHORTNESS OF BREATH  *UNUSUAL BRUISING OR BLEEDING  TENDERNESS IN MOUTH AND THROAT WITH OR WITHOUT PRESENCE OF ULCERS  *URINARY PROBLEMS  *BOWEL PROBLEMS  UNUSUAL RASH Items with * indicate a potential emergency and should be followed up as soon as possible.  Feel free to call the clinic should you have any questions or concerns. The clinic phone number is [phone].  Please show the CHEMO ALERT CARD at check-in to the Emergency Department and triage nurse.   

## 2020-11-18 NOTE — Patient Instructions (Signed)

## 2020-11-24 NOTE — Progress Notes (Signed)
[place]  [phone] [phone]    ID: Penny Brown DOB: October 29, 1970  MR#: [id number]  GDJ#:[id number]  Patient Care Team: [name], NP as PCP - General (Family Medicine) [name], RN as Oncology Nurse Navigator [name], RN as Oncology Nurse Navigator Jaegar Croft, [name], MD as Consulting Physician (Oncology) [name], MD as Consulting Physician (General Surgery) [name], MD as Consulting Physician (Radiation Oncology) [name], MD OTHER MD:  CHIEF COMPLAINT: recurrent vs. new triple negative breast cancer  CURRENT TREATMENT: neoadjuvant chemotherapy   INTERVAL HISTORY: Lanelle returns today for follow up and treatment of her second triple negative breast cancer. She is accompanied by her husband.  She began neoadjuvant chemotherapy, consisting of pembrolizumab  carboplatin, and paclitaxel on 10/14/2020.  She receives the carbo/taxol weekly x3 followed by 1 week break.  She receives the pembrolizumab every 21 days.  Today she is here for the beginning of cycle 2 of carbo and Taxol.    REVIEW OF SYSTEMS: Libra is tolerating treatment moderately well. However she is beginning to develop numbness and tingling in her finger and toe pads. This is not persistent or at least she is not constantly aware of it but she does notice it more now than before. She has had no other significant side effects aside from hair loss. 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 She received 4 cycles of doxorubicin and cyclophosphamide followed by 4 cycles of of paclitaxel, which was discontinued  secondary to neuropathy. She was released from follow up here in 2012.  More recently, she presented with a palpable upper-inner right breast mass. She underwent bilateral diagnostic mammography with tomography and right breast ultrasonography at [facility] on 09/22/2020 showing: breast density category A; 2.2 cm irregular mass in upper-inner right breast; no significant abnormalities in right axilla.  Accordingly on 09/22/2020 she proceeded to biopsy of the right breast area in question. The pathology from this procedure ([id number]) showed: invasive ductal carcinoma, grade 3. Prognostic indicators significant for: estrogen receptor, 0% negative and progesterone receptor, 0% negative. Proliferation marker Ki67 at 80%. HER2 negatve by immunohistochemistry (1+).  Cancer Staging Malignant neoplasm of upper-inner quadrant of right breast in female, estrogen receptor negative ([place]) Staging form: Breast, AJCC 8th Edition - Clinical stage from 09/29/2020: Stage IIB (rcT2, cN0, cM0, G3, ER-, PR-, HER2-) - Signed by [name], MD on 09/29/2020 Stage prefix: Recurrence  Malignant neoplasm of upper-outer quadrant of right breast in female, estrogen receptor negative ([place]) Staging form: Breast, AJCC 8th Edition - Clinical: cT2, cN1, cM0, ER-, PR-, HER2- - Signed by [name], MD on 09/29/2020 Stage prefix: Initial diagnosis Nuclear grade: G3  The patient's subsequent history is as detailed below.   PAST MEDICAL HISTORY: Past Medical History:  Diagnosis Date  . Breast cancer [facility]) 2008   Right Breast Cancer  . Cancer ([place])   . Family history of breast cancer   . Family history of prostate cancer   . Personal history of chemotherapy 2008   Right Breast Cancer  . Personal history of radiation therapy 2008   Right Breast Cancer    PAST SURGICAL HISTORY: Past Surgical History:  Procedure  Laterality Date  . BREAST LUMPECTOMY Right 2008  . CESAREAN SECTION    . PORTACATH PLACEMENT Left  10/13/2020   Procedure: INSERTION PORT-A-CATH;  Surgeon: Emelia Loron, MD;  Location: [facility];  Service: General;  Laterality: Left;  LEAVE PORT ACCESSED CHEMO STARTS ON 2/10, LEFT SIDE    FAMILY HISTORY: Family History  Problem Relation Age of Onset  . Breast cancer Maternal Grandmother 45  . Prostate cancer Maternal Grandfather        dx 32s, metastatic   Her father died from heart attack (possibly Covid?) at age 65. Her mother is age 46 as of 09/2020. Penny Brown has 2 brothers and 2 sisters. She reports breast cancer in her maternal grandmother in her late 9's.   GYNECOLOGIC HISTORY:  No LMP recorded. Menarche: 50 years old Age at first live birth: 50 years old GX P 3 LMP 09/09/2020, periods are regular, 7 days with 4 heavy Contraceptive: s/p BTL with coils in place HRT n/a  Hysterectomy? no BSO? 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 Husband Rocky Link is a Veterinary surgeon. She lives at home with Rocky Link, daughter Esaw Dace, age 37, and daughter Elmarie Shiley, age 17 who works as a Risk manager. Son Iantha Fallen, age 48, is a Therapist, nutritional in [employer]. Flavia has one grandchild. She attends [employer].    ADVANCED DIRECTIVES: In the absence of any documentation to the contrary, the patient's spouse is their HCPOA.    HEALTH MAINTENANCE: Social History   Tobacco Use  . Smoking status: Never Smoker  . Smokeless tobacco: Never Used  Substance Use Topics  . Alcohol use: No  . Drug use: No     Colonoscopy: n/a (age)   PAP: 2019  Bone density: n/a (age)   No Known Allergies  Current Outpatient Medications  Medication Sig Dispense Refill  . LORazepam (ATIVAN) 0.5 MG tablet Take one tablet before coming to cancer center for chemo treatment 30 tablet 0  . lidocaine-prilocaine (EMLA) cream Apply to affected area once 30 g 3  . prochlorperazine (COMPAZINE) 10 MG tablet Take 1  tablet (10 mg total) by mouth every 6 (six) hours as needed (Nausea or vomiting). 30 tablet 1  . traMADol (ULTRAM) 50 MG tablet Take 1 tablet (50 mg total) by mouth every 6 (six) hours as needed. 8 tablet 0   No current facility-administered medications for this visit.   Facility-Administered Medications Ordered in Other Visits  Medication Dose Route Frequency Provider Last Rate Last Admin  . sodium chloride flush (NS) 0.9 % injection 10 mL  10 mL Intracatheter PRN Adien Kimmel, Valentino Hue, MD   10 mL at 11/11/20 1750    OBJECTIVE: African-American woman who appears younger than stated age  Vitals:   11/25/20 0931  BP: 114/77  Pulse: 97  Resp: 18  Temp: (!) 97.3 F (36.3 C)  SpO2: 100%     Body mass index is 30.07 kg/m.   Wt Readings from Last 3 Encounters:  11/25/20 164 lb 6.4 oz (74.6 kg)  11/18/20 168 lb (76.2 kg)  11/11/20 167 lb 6.4 oz (75.9 kg)     ECOG FS:1 - Symptomatic but completely ambulatory  Sclerae unicteric, EOMs intact Wearing a mask No cervical or supraclavicular adenopathy Lungs no rales or rhonchi Heart regular rate and rhythm Abd soft, nontender, positive bowel sounds MSK no focal spinal tenderness, no upper extremity lymphedema Neuro: nonfocal, well  oriented, appropriate affect Breasts: deferred  LAB RESULTS:  CMP     Component Value Date/Time   NA 139 11/25/2020 0914   K 4.3 11/25/2020 0914   CL 106 11/25/2020 0914   CO2 24 11/25/2020 0914   GLUCOSE 109 (H) 11/25/2020 0914   BUN 11 11/25/2020 0914   CREATININE 0.80 11/25/2020 0914   CREATININE 0.71 10/28/2020 1135   CALCIUM 9.1 11/25/2020 0914   PROT 7.2 11/25/2020 0914   ALBUMIN 3.8 11/25/2020 0914   AST 18 11/25/2020 0914   AST 18 10/28/2020 1135   ALT 17 11/25/2020 0914   ALT 20 10/28/2020 1135   ALKPHOS 79 11/25/2020 0914   BILITOT 0.3 11/25/2020 0914   BILITOT 0.3 10/28/2020 1135   GFRNONAA >60 11/25/2020 0914   GFRNONAA >60 10/28/2020 1135   GFRAA  04/11/2007 1225    >60         The eGFR has been calculated using the MDRD equation. This calculation has not been validated in all clinical    No results found for: TOTALPROTELP, ALBUMINELP, A1GS, A2GS, BETS, BETA2SER, GAMS, MSPIKE, SPEI  Lab Results  Component Value Date   WBC 2.9 (L) 11/25/2020   NEUTROABS 1.3 (L) 11/25/2020   HGB 9.7 (L) 11/25/2020   HCT 29.5 (L) 11/25/2020   MCV 87.5 11/25/2020   PLT 182 11/25/2020    Lab Results  Component Value Date   LABCA2 17 11/25/2007    No components found for: DTOIZT245  No results for input(s): INR in the last 168 hours.  Lab Results  Component Value Date   LABCA2 17 11/25/2007    No results found for: YKD983  No results found for: JAS505  No results found for: LZJ673  No results found for: CA2729  No components found for: HGQUANT  No results found for: CEA1 / No results found for: CEA1   No results found for: AFPTUMOR  No results found for: CHROMOGRNA  No results found for: KPAFRELGTCHN, LAMBDASER, KAPLAMBRATIO (kappa/lambda light chains)  No results found for: HGBA, HGBA2QUANT, HGBFQUANT, HGBSQUAN (Hemoglobinopathy evaluation)   Lab Results  Component Value Date   LDH 156 11/25/2007    No results found for: IRON, TIBC, IRONPCTSAT (Iron and TIBC)  No results found for: FERRITIN  Urinalysis    Component Value Date/Time   LABSPEC 1.015 03/13/2017 1905   PHURINE 7.0 03/13/2017 1905   GLUCOSEU NEGATIVE 03/13/2017 1905   HGBUR MODERATE (A) 03/13/2017 1905   BILIRUBINUR NEGATIVE 03/13/2017 1905   KETONESUR NEGATIVE 03/13/2017 1905   PROTEINUR NEGATIVE 03/13/2017 1905   UROBILINOGEN 0.2 03/13/2017 1905   NITRITE NEGATIVE 03/13/2017 1905   LEUKOCYTESUR MODERATE (A) 03/13/2017 1905    STUDIES: CT Chest W Contrast  Result Date: 10/28/2020 CLINICAL DATA:  Triple negative breast cancer, chest staging EXAM: CT CHEST WITH CONTRAST TECHNIQUE: Multidetector CT imaging of the chest was performed during intravenous contrast  administration. CONTRAST:  27m OMNIPAQUE IOHEXOL 300 MG/ML  SOLN COMPARISON:  07/16/2007 FINDINGS: Cardiovascular: Left chest port catheter. Normal heart size. No pericardial effusion. Mediastinum/Nodes: No enlarged mediastinal, hilar, or axillary lymph nodes. Small hiatal hernia. Thyroid gland, trachea, and esophagus demonstrate no significant findings. Lungs/Pleura: There are numerous tiny pulmonary nodules throughout the lungs, for example a 3 mm nodule of the superior segment left lower lobe (series 7, image 57) and a 2 mm nodule of the right apex (series 7, image 28). No pleural effusion or pneumothorax. Upper Abdomen: No acute abnormality. Musculoskeletal: No suspicious bone lesions identified. Spiculated appearing  mass of the medial right breast containing a biopsy marking clip (series 2, image 56). Surgical clip in the right axilla (series 2, image 46). IMPRESSION: 1. Spiculated appearing mass of the medial right breast containing a biopsy marking clip. Surgical clip in the right axilla. Findings are in keeping with known primary breast malignancy. 2. There are numerous tiny pulmonary nodules throughout the lungs, measuring 3 mm and smaller, nonspecific. Differential considerations include both sequelae of prior infection and metastatic disease. These are new compared to remote prior examination dated 07/16/2007. Comparison to more recent prior imaging of the chest, if available, would be helpful to assess for stability. Electronically Signed   By: Eddie Candle M.D.   On: 10/28/2020 15:28   NM Bone Scan Whole Body  Result Date: 10/29/2020 CLINICAL DATA:  Breast carcinoma EXAM: NUCLEAR MEDICINE WHOLE BODY BONE SCAN TECHNIQUE: Whole body anterior and posterior images were obtained approximately 3 hours after intravenous injection of radiopharmaceutical. RADIOPHARMACEUTICALS:  19.0 mCi Technetium-48mMDP IV COMPARISON:  Prior bone scan examination May 20, 2007. Chest CT October 27, 2020 FINDINGS:  Radiotracer uptake in the bony structures is unremarkable. There are no findings indicative of bony metastatic disease. Kidneys are noted in the flank positions bilaterally. IMPRESSION: No bony metastatic disease evident. Electronically Signed   By: WLowella GripIII M.D.   On: 10/29/2020 10:36     ELIGIBLE FOR AVAILABLE RESEARCH PROTOCOL: no  ASSESSMENT: 50y.o. [place] woman  (1) status post right breast upper outer quadrant lumpectomy and sentinel lymph node sampling 04/15/2007 for a pT2 pN1, stage IIIB invasive ductal carcinoma, grade 3, triple negative, with an MIB-1 of 59%  (a) adjuvant chemotherapy consisted of doxorubicin and cyclophosphamide in dose dense fashion x4 followed by weekly paclitaxel x12  (b) received adjuvant radiation  (c) a total of 4 right axillary lymph nodes were removed  (2) status post right breast upper inner quadrant biopsy 09/22/2020 for a clinical T2 N2, stage IIIC invasive ductal carcinoma, grade 3, triple negative, with an MIB-1 of 80%.  (a) chest CT 10/28/2020 shows indeterminate 0.3 cm or smaller pulmonary nodules  (b) bone scan 10/29/2020 shows no evidence of metatstatic disease  (3) genetics testing 10/19/2020 through the Common hereditary Cancers panel offered by [facility] shows no deleterious mutations  (4) neoadjuvant chemotherapy will consist of pembrolizumab given every 3 weeks, and carboplatin + paclitaxel given weekly, 3 weeks on, one week off, for 6 cycles as tolerated  (5) definitive surgery to follow  (6) continue pembrolizumab to total one year   PLAN:  TIsatuwill proceed to treatment today but I am concerned at the beginning of this cycle (no chemotherapy past 2 weeks) her ANC is only 1.3. This means we have limited marrow reserves and will need to eithoer decrease doses, increase the dosing interval, or add growth factors.  A second concern is the development of neuropathy. If it proceeds beyond the current level we will have  to stop the taxane. It isn't clear what our alternatives would be-- we could continue c[place] (with pembro), attempt to substitute another agent (gemzar for example, but that may worsen the neutropenia), switch to CMF (poor record neoadjuvantly). One possibility would be carbo/doxil-- this is relatively marrow-sparing and could be given every three weeks with pegfilgastrim support. We would want an echo before starting given her history of prior anthracycline.  It may be that we can continue current treatment if there is nos ignificant change next week. Just in case I will  write for an echo in consideration of a possible switch to carbo/doxil Q3w  Total encounter time 25 minutes.Sarajane Jews C. Devetta Hagenow, MD 11/25/20 8:28 PM Medical Oncology and Hematology [facility] [place], [place] [address] Tel. [phone]    Fax. [phone]   I, Wilburn Mylar, am acting as scribe for Dr. [name]. Christinea Brizuela.  I, Lurline Del MD, have reviewed the above documentation for accuracy and completeness, and I agree with the above.    *Total Encounter Time as defined by the Centers for Medicare and Medicaid Services includes, in addition to the face-to-face time of a patient visit (documented in the note above) non-face-to-face time: obtaining and reviewing outside history, ordering and reviewing medications, tests or procedures, care coordination (communications with other health care professionals or caregivers) and documentation in the medical record.

## 2020-11-25 ENCOUNTER — Inpatient Hospital Stay (HOSPITAL_BASED_OUTPATIENT_CLINIC_OR_DEPARTMENT_OTHER): Payer: 59 | Admitting: Oncology

## 2020-11-25 ENCOUNTER — Inpatient Hospital Stay: Payer: 59

## 2020-11-25 ENCOUNTER — Other Ambulatory Visit: Payer: Self-pay

## 2020-11-25 VITALS — BP 114/77 | HR 97 | Temp 97.3°F | Resp 18 | Ht 62.0 in | Wt 164.4 lb

## 2020-11-25 DIAGNOSIS — C50211 Malignant neoplasm of upper-inner quadrant of right female breast: Secondary | ICD-10-CM

## 2020-11-25 DIAGNOSIS — Z171 Estrogen receptor negative status [ER-]: Secondary | ICD-10-CM

## 2020-11-25 DIAGNOSIS — Z95828 Presence of other vascular implants and grafts: Secondary | ICD-10-CM

## 2020-11-25 LAB — COMPREHENSIVE METABOLIC PANEL
ALT: 17 U/L (ref 0–44)
AST: 18 U/L (ref 15–41)
Albumin: 3.8 g/dL (ref 3.5–5.0)
Alkaline Phosphatase: 79 U/L (ref 38–126)
Anion gap: 9 (ref 5–15)
BUN: 11 mg/dL (ref 6–20)
CO2: 24 mmol/L (ref 22–32)
Calcium: 9.1 mg/dL (ref 8.9–10.3)
Chloride: 106 mmol/L (ref 98–111)
Creatinine, Ser: 0.8 mg/dL (ref 0.44–1.00)
GFR, Estimated: >60 mL/min (ref >60)
Glucose, Bld: 109 mg/dL — ABNORMAL HIGH (ref 70–99)
Potassium: 4.3 mmol/L (ref 3.5–5.1)
Sodium: 139 mmol/L (ref 135–145)
Total Bilirubin: 0.3 mg/dL (ref 0.3–1.2)
Total Protein: 7.2 g/dL (ref 6.5–8.1)

## 2020-11-25 LAB — CBC WITH DIFFERENTIAL/PLATELET
Abs Immature Granulocytes: 0.01 10*3/uL (ref 0.00–0.07)
Basophils Absolute: 0 10*3/uL (ref 0.0–0.1)
Basophils Relative: 0 %
Eosinophils Absolute: 0 10*3/uL (ref 0.0–0.5)
Eosinophils Relative: 1 %
HCT: 29.5 % — ABNORMAL LOW (ref 36.0–46.0)
Hemoglobin: 9.7 g/dL — ABNORMAL LOW (ref 12.0–15.0)
Immature Granulocytes: 0 %
Lymphocytes Relative: 48 %
Lymphs Abs: 1.4 10*3/uL (ref 0.7–4.0)
MCH: 28.8 pg (ref 26.0–34.0)
MCHC: 32.9 g/dL (ref 30.0–36.0)
MCV: 87.5 fL (ref 80.0–100.0)
Monocytes Absolute: 0.2 10*3/uL (ref 0.1–1.0)
Monocytes Relative: 5 %
Neutro Abs: 1.3 10*3/uL — ABNORMAL LOW (ref 1.7–7.7)
Neutrophils Relative %: 46 %
Platelets: 182 10*3/uL (ref 150–400)
RBC: 3.37 MIL/uL — ABNORMAL LOW (ref 3.87–5.11)
RDW: 14.2 % (ref 11.5–15.5)
WBC: 2.9 10*3/uL — ABNORMAL LOW (ref 4.0–10.5)
nRBC: 0 % (ref 0.0–0.2)

## 2020-11-25 MED ORDER — HEPARIN SOD (PORK) LOCK FLUSH 100 UNIT/ML IV SOLN
500.0000 [IU] | Freq: Once | INTRAVENOUS | Status: AC | PRN
  Administered 2020-11-25: 500 [IU]
  Filled 2020-11-25: qty 5

## 2020-11-25 MED ORDER — SODIUM CHLORIDE 0.9 % IV SOLN
10.0000 mg | Freq: Once | INTRAVENOUS | Status: AC
  Administered 2020-11-25: 10 mg via INTRAVENOUS
  Filled 2020-11-25: qty 10

## 2020-11-25 MED ORDER — PALONOSETRON HCL INJECTION 0.25 MG/5ML
INTRAVENOUS | Status: AC
  Filled 2020-11-25: qty 5

## 2020-11-25 MED ORDER — LORAZEPAM 0.5 MG PO TABS: ORAL_TABLET | ORAL | 0 refills | Status: DC

## 2020-11-25 MED ORDER — SODIUM CHLORIDE 0.9% FLUSH
10.0000 mL | INTRAVENOUS | Status: DC | PRN
  Administered 2020-11-25: 10 mL
  Filled 2020-11-25: qty 10

## 2020-11-25 MED ORDER — SODIUM CHLORIDE 0.9 % IV SOLN
255.0000 mg | Freq: Once | INTRAVENOUS | Status: AC
  Administered 2020-11-25: 260 mg via INTRAVENOUS
  Filled 2020-11-25: qty 26

## 2020-11-25 MED ORDER — FAMOTIDINE IN NACL 20-0.9 MG/50ML-% IV SOLN
20.0000 mg | Freq: Once | INTRAVENOUS | Status: AC
  Administered 2020-11-25: 20 mg via INTRAVENOUS

## 2020-11-25 MED ORDER — SODIUM CHLORIDE 0.9 % IV SOLN
80.0000 mg/m2 | Freq: Once | INTRAVENOUS | Status: AC
  Administered 2020-11-25: 150 mg via INTRAVENOUS
  Filled 2020-11-25: qty 25

## 2020-11-25 MED ORDER — DIPHENHYDRAMINE HCL 50 MG/ML IJ SOLN
25.0000 mg | Freq: Once | INTRAMUSCULAR | Status: AC
  Administered 2020-11-25: 25 mg via INTRAVENOUS

## 2020-11-25 MED ORDER — PALONOSETRON HCL INJECTION 0.25 MG/5ML
0.2500 mg | Freq: Once | INTRAVENOUS | Status: AC
  Administered 2020-11-25: 0.25 mg via INTRAVENOUS

## 2020-11-25 MED ORDER — FAMOTIDINE IN NACL 20-0.9 MG/50ML-% IV SOLN
INTRAVENOUS | Status: AC
  Filled 2020-11-25: qty 50

## 2020-11-25 MED ORDER — DIPHENHYDRAMINE HCL 50 MG/ML IJ SOLN
INTRAMUSCULAR | Status: AC
  Filled 2020-11-25: qty 1

## 2020-11-25 MED ORDER — SODIUM CHLORIDE 0.9 % IV SOLN
200.0000 mg | Freq: Once | INTRAVENOUS | Status: AC
  Administered 2020-11-25: 200 mg via INTRAVENOUS
  Filled 2020-11-25: qty 8

## 2020-11-25 MED ORDER — SODIUM CHLORIDE 0.9% FLUSH
10.0000 mL | Freq: Once | INTRAVENOUS | Status: AC
  Administered 2020-11-25: 10 mL
  Filled 2020-11-25: qty 10

## 2020-11-25 MED ORDER — SODIUM CHLORIDE 0.9 % IV SOLN
Freq: Once | INTRAVENOUS | Status: AC
  Filled 2020-11-25: qty 250

## 2020-11-25 NOTE — Patient Instructions (Signed)
Pembrolizumab injection What is this medicine? PEMBROLIZUMAB (pem broe liz ue mab) is a monoclonal antibody. It is used to treat certain types of cancer. This medicine may be used for other purposes; ask your health care provider or pharmacist if you have questions. COMMON BRAND NAME(S): Keytruda What should I tell my health care provider before I take this medicine? They need to know if you have any of these conditions:  autoimmune diseases like Crohn's disease, ulcerative colitis, or lupus  have had or planning to have an allogeneic stem cell transplant (uses someone else's stem cells)  history of organ transplant  history of chest radiation  nervous system problems like myasthenia gravis or Guillain-Barre syndrome  an unusual or allergic reaction to pembrolizumab, other medicines, foods, dyes, or preservatives  pregnant or trying to get pregnant  breast-feeding How should I use this medicine? This medicine is for infusion into a vein. It is given by a health care professional in a hospital or clinic setting. A special MedGuide will be given to you before each treatment. Be sure to read this information carefully each time. Talk to your pediatrician regarding the use of this medicine in children. While this drug may be prescribed for children as young as 6 months for selected conditions, precautions do apply. Overdosage: If you think you have taken too much of this medicine contact a poison control center or emergency room at once. NOTE: This medicine is only for you. Do not share this medicine with others. What if I miss a dose? It is important not to miss your dose. Call your doctor or health care professional if you are unable to keep an appointment. What may interact with this medicine? Interactions have not been studied. This list may not describe all possible interactions. Give your health care provider a list of all the medicines, herbs, non-prescription drugs, or dietary  supplements you use. Also tell them if you smoke, drink alcohol, or use illegal drugs. Some items may interact with your medicine. What should I watch for while using this medicine? Your condition will be monitored carefully while you are receiving this medicine. You may need blood work done while you are taking this medicine. Do not become pregnant while taking this medicine or for 4 months after stopping it. Women should inform their doctor if they wish to become pregnant or think they might be pregnant. There is a potential for serious side effects to an unborn child. Talk to your health care professional or pharmacist for more information. Do not breast-feed an infant while taking this medicine or for 4 months after the last dose. What side effects may I notice from receiving this medicine? Side effects that you should report to your doctor or health care professional as soon as possible:  allergic reactions like skin rash, itching or hives, swelling of the face, lips, or tongue  bloody or black, tarry  breathing problems  changes in vision  chest pain  chills  confusion  constipation  cough  diarrhea  dizziness or feeling faint or lightheaded  fast or irregular heartbeat  fever  flushing  joint pain  low blood counts - this medicine may decrease the number of white blood cells, red blood cells and platelets. You may be at increased risk for infections and bleeding.  muscle pain  muscle weakness  pain, tingling, numbness in the hands or feet  persistent headache  redness, blistering, peeling or loosening of the skin, including inside the mouth  signs and   symptoms of high blood sugar such as dizziness; dry mouth; dry skin; fruity breath; nausea; stomach pain; increased hunger or thirst; increased urination  signs and symptoms of kidney injury like trouble passing urine or change in the amount of urine  signs and symptoms of liver injury like dark urine,  light-colored stools, loss of appetite, nausea, right upper belly pain, yellowing of the eyes or skin  sweating  swollen lymph nodes  weight loss Side effects that usually do not require medical attention (report to your doctor or health care professional if they continue or are bothersome):  decreased appetite  hair loss  tiredness This list may not describe all possible side effects. Call your doctor for medical advice about side effects. You may report side effects to FDA at 1-800-FDA-1088. Where should I keep my medicine? This drug is given in a hospital or clinic and will not be stored at home. NOTE: This sheet is a summary. It may not cover all possible information. If you have questions about this medicine, talk to your doctor, pharmacist, or health care provider.  2021 [employer]/Gold Standard (2019-07-23 21:44:53) Paclitaxel injection What is this medicine? PACLITAXEL (PAK li TAX el) is a chemotherapy drug. It targets fast dividing cells, like cancer cells, and causes these cells to die. This medicine is used to treat ovarian cancer, breast cancer, lung cancer, Kaposi's sarcoma, and other cancers. This medicine may be used for other purposes; ask your health care provider or pharmacist if you have questions. COMMON BRAND NAME(S): Onxol, Taxol What should I tell my health care provider before I take this medicine? They need to know if you have any of these conditions:  history of irregular heartbeat  liver disease  low blood counts, like low white cell, platelet, or red cell counts  lung or breathing disease, like asthma  tingling of the fingers or toes, or other nerve disorder  an unusual or allergic reaction to paclitaxel, alcohol, polyoxyethylated castor oil, other chemotherapy, other medicines, foods, dyes, or preservatives  pregnant or trying to get pregnant  breast-feeding How should I use this medicine? This drug is given as an infusion into a vein. It is  administered in a hospital or clinic by a specially trained health care professional. Talk to your pediatrician regarding the use of this medicine in children. Special care may be needed. Overdosage: If you think you have taken too much of this medicine contact a poison control center or emergency room at once. NOTE: This medicine is only for you. Do not share this medicine with others. What if I miss a dose? It is important not to miss your dose. Call your doctor or health care professional if you are unable to keep an appointment. What may interact with this medicine? Do not take this medicine with any of the following medications:  live virus vaccines This medicine may also interact with the following medications:  antiviral medicines for hepatitis, HIV or AIDS  certain antibiotics like erythromycin and clarithromycin  certain medicines for fungal infections like ketoconazole and itraconazole  certain medicines for seizures like carbamazepine, phenobarbital, phenytoin  gemfibrozil  nefazodone  rifampin  St. John's wort This list may not describe all possible interactions. Give your health care provider a list of all the medicines, herbs, non-prescription drugs, or dietary supplements you use. Also tell them if you smoke, drink alcohol, or use illegal drugs. Some items may interact with your medicine. What should I watch for while using this medicine? Your condition will   be monitored carefully while you are receiving this medicine. You will need important blood work done while you are taking this medicine. This medicine can cause serious allergic reactions. To reduce your risk you will need to take other medicine(s) before treatment with this medicine. If you experience allergic reactions like skin rash, itching or hives, swelling of the face, lips, or tongue, tell your doctor or health care professional right away. In some cases, you may be given additional medicines to help with  side effects. Follow all directions for their use. This drug may make you feel generally unwell. This is not uncommon, as chemotherapy can affect healthy cells as well as cancer cells. Report any side effects. Continue your course of treatment even though you feel ill unless your doctor tells you to stop. Call your doctor or health care professional for advice if you get a fever, chills or sore throat, or other symptoms of a cold or flu. Do not treat yourself. This drug decreases your body's ability to fight infections. Try to avoid being around people who are sick. This medicine may increase your risk to bruise or bleed. Call your doctor or health care professional if you notice any unusual bleeding. Be careful brushing and flossing your teeth or using a toothpick because you may get an infection or bleed more easily. If you have any dental work done, tell your dentist you are receiving this medicine. Avoid taking products that contain aspirin, acetaminophen, ibuprofen, naproxen, or ketoprofen unless instructed by your doctor. These medicines may hide a fever. Do not become pregnant while taking this medicine. Women should inform their doctor if they wish to become pregnant or think they might be pregnant. There is a potential for serious side effects to an unborn child. Talk to your health care professional or pharmacist for more information. Do not breast-feed an infant while taking this medicine. Men are advised not to father a child while receiving this medicine. This product may contain alcohol. Ask your pharmacist or healthcare provider if this medicine contains alcohol. Be sure to tell all healthcare providers you are taking this medicine. Certain medicines, like metronidazole and disulfiram, can cause an unpleasant reaction when taken with alcohol. The reaction includes flushing, headache, nausea, vomiting, sweating, and increased thirst. The reaction can last from 30 minutes to several hours. What  side effects may I notice from receiving this medicine? Side effects that you should report to your doctor or health care professional as soon as possible:  allergic reactions like skin rash, itching or hives, swelling of the face, lips, or tongue  breathing problems  changes in vision  fast, irregular heartbeat  high or low blood pressure  mouth sores  pain, tingling, numbness in the hands or feet  signs of decreased platelets or bleeding - bruising, pinpoint red spots on the skin, black, tarry stools, blood in the urine  signs of decreased red blood cells - unusually weak or tired, feeling faint or lightheaded, falls  signs of infection - fever or chills, cough, sore throat, pain or difficulty passing urine  signs and symptoms of liver injury like dark yellow or brown urine; general ill feeling or flu-like symptoms; light-colored stools; loss of appetite; nausea; right upper belly pain; unusually weak or tired; yellowing of the eyes or skin  swelling of the ankles, feet, hands  unusually slow heartbeat Side effects that usually do not require medical attention (report to your doctor or health care professional if they continue or are bothersome):    diarrhea  hair loss  loss of appetite  muscle or joint pain  nausea, vomiting  pain, redness, or irritation at site where injected  tiredness This list may not describe all possible side effects. Call your doctor for medical advice about side effects. You may report side effects to FDA at 1-800-FDA-1088. Where should I keep my medicine? This drug is given in a hospital or clinic and will not be stored at home. NOTE: This sheet is a summary. It may not cover all possible information. If you have questions about this medicine, talk to your doctor, pharmacist, or health care provider.  2021 [employer]/Gold Standard (2019-07-23 13:37:23) Carboplatin injection What is this medicine? CARBOPLATIN (KAR boe pla tin) is a  chemotherapy drug. It targets fast dividing cells, like cancer cells, and causes these cells to die. This medicine is used to treat ovarian cancer and many other cancers. This medicine may be used for other purposes; ask your health care provider or pharmacist if you have questions. COMMON BRAND NAME(S): Paraplatin What should I tell my health care provider before I take this medicine? They need to know if you have any of these conditions:  blood disorders  hearing problems  kidney disease  recent or ongoing radiation therapy  an unusual or allergic reaction to carboplatin, cisplatin, other chemotherapy, other medicines, foods, dyes, or preservatives  pregnant or trying to get pregnant  breast-feeding How should I use this medicine? This drug is usually given as an infusion into a vein. It is administered in a hospital or clinic by a specially trained health care professional. Talk to your pediatrician regarding the use of this medicine in children. Special care may be needed. Overdosage: If you think you have taken too much of this medicine contact a poison control center or emergency room at once. NOTE: This medicine is only for you. Do not share this medicine with others. What if I miss a dose? It is important not to miss a dose. Call your doctor or health care professional if you are unable to keep an appointment. What may interact with this medicine?  medicines for seizures  medicines to increase blood counts like filgrastim, pegfilgrastim, sargramostim  some antibiotics like amikacin, gentamicin, neomycin, streptomycin, tobramycin  vaccines Talk to your doctor or health care professional before taking any of these medicines:  acetaminophen  aspirin  ibuprofen  ketoprofen  naproxen This list may not describe all possible interactions. Give your health care provider a list of all the medicines, herbs, non-prescription drugs, or dietary supplements you use. Also tell  them if you smoke, drink alcohol, or use illegal drugs. Some items may interact with your medicine. What should I watch for while using this medicine? Your condition will be monitored carefully while you are receiving this medicine. You will need important blood work done while you are taking this medicine. This drug may make you feel generally unwell. This is not uncommon, as chemotherapy can affect healthy cells as well as cancer cells. Report any side effects. Continue your course of treatment even though you feel ill unless your doctor tells you to stop. In some cases, you may be given additional medicines to help with side effects. Follow all directions for their use. Call your doctor or health care professional for advice if you get a fever, chills or sore throat, or other symptoms of a cold or flu. Do not treat yourself. This drug decreases your body's ability to fight infections. Try to avoid being around people   who are sick. This medicine may increase your risk to bruise or bleed. Call your doctor or health care professional if you notice any unusual bleeding. Be careful brushing and flossing your teeth or using a toothpick because you may get an infection or bleed more easily. If you have any dental work done, tell your dentist you are receiving this medicine. Avoid taking products that contain aspirin, acetaminophen, ibuprofen, naproxen, or ketoprofen unless instructed by your doctor. These medicines may hide a fever. Do not become pregnant while taking this medicine. Women should inform their doctor if they wish to become pregnant or think they might be pregnant. There is a potential for serious side effects to an unborn child. Talk to your health care professional or pharmacist for more information. Do not breast-feed an infant while taking this medicine. What side effects may I notice from receiving this medicine? Side effects that you should report to your doctor or health care professional  as soon as possible:  allergic reactions like skin rash, itching or hives, swelling of the face, lips, or tongue  signs of infection - fever or chills, cough, sore throat, pain or difficulty passing urine  signs of decreased platelets or bleeding - bruising, pinpoint red spots on the skin, black, tarry stools, nosebleeds  signs of decreased red blood cells - unusually weak or tired, fainting spells, lightheadedness  breathing problems  changes in hearing  changes in vision  chest pain  high blood pressure  low blood counts - This drug may decrease the number of white blood cells, red blood cells and platelets. You may be at increased risk for infections and bleeding.  nausea and vomiting  pain, swelling, redness or irritation at the injection site  pain, tingling, numbness in the hands or feet  problems with balance, talking, walking  trouble passing urine or change in the amount of urine Side effects that usually do not require medical attention (report to your doctor or health care professional if they continue or are bothersome):  hair loss  loss of appetite  metallic taste in the mouth or changes in taste This list may not describe all possible side effects. Call your doctor for medical advice about side effects. You may report side effects to FDA at 1-800-FDA-1088. Where should I keep my medicine? This drug is given in a hospital or clinic and will not be stored at home. NOTE: This sheet is a summary. It may not cover all possible information. If you have questions about this medicine, talk to your doctor, pharmacist, or health care provider.  2021 [employer]vier/Gold Standard (2007-11-26 14:38:05)  

## 2020-11-25 NOTE — Progress Notes (Unsigned)
[facility] for today's treatment per Dr Jana Hakim with [facility]: 1.3

## 2020-12-01 NOTE — Progress Notes (Signed)
[name]  [phone] [phone]) [phone]    ID: Penny Brown DOB: 1970/09/17  MR#: [id number]  EHU#:[id number]  Patient Care Team: [name], NP as PCP - General (Family Medicine) [name], RN as Oncology Nurse Navigator [name], RN as Oncology Nurse Navigator Broghan Pannone, [name], MD as Consulting Physician (Oncology) [name], MD as Consulting Physician (General Surgery) [name], MD as Consulting Physician (Radiation Oncology) [name], MD OTHER MD:  CHIEF COMPLAINT: recurrent vs. new triple negative breast cancer  CURRENT TREATMENT: neoadjuvant chemotherapy   INTERVAL HISTORY: Penny Brown returns today for follow up and treatment of her second triple negative breast cancer. She is not receiving any chemotherapy today.  She began neoadjuvant chemotherapy, consisting of pembrolizumab, carboplatin, and paclitaxel on 10/14/2020.  She receives the carbo/taxol weekly x3 followed by 1 week break, which is this week.    She receives the pembrolizumab every 21 days.  She had her most recent dose 11/25/2020   REVIEW OF SYSTEMS: Tanashia says she generally feels a bit tired after treatment but when she receives the [place] she feels better.  She tells me she has a difficult time now palpating the mass in the right breast.  She is doing some video workouts at home but not taking walks outside the home.  She is also doing some weights.  Port is working well, she has no nausea or vomiting, and no mouth sores.  She was having a little bit of possible peripheral neuropathy.  She says this is actually better and this moment she has no numbness or tingling in either her finger pads or toe pads.  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 She received 4 cycles of doxorubicin and cyclophosphamide followed by 4 cycles of of paclitaxel, which was discontinued secondary to neuropathy. She was released from follow up here in 2012.  More recently, she presented with a palpable upper-inner right breast mass. She underwent bilateral diagnostic mammography with tomography and right breast ultrasonography at [facility] on 09/22/2020 showing: breast density category A; 2.2 cm irregular mass in upper-inner right breast; no significant abnormalities in right axilla.  Accordingly on 09/22/2020 she proceeded to biopsy of the right breast area in question. The pathology from this procedure ([id number]) showed: invasive ductal carcinoma, grade 3. Prognostic indicators significant for: estrogen receptor, 0% negative and progesterone receptor, 0% negative. Proliferation marker Ki67 at 80%. HER2 negatve by immunohistochemistry (1+).  Cancer Staging Malignant neoplasm of upper-inner quadrant of right breast in female, estrogen receptor negative ([place]) Staging form: Breast, AJCC 8th Edition - Clinical stage from 09/29/2020: Stage IIB (rcT2, cN0, cM0, G3, ER-, PR-, HER2-) - Signed by [name], MD on 09/29/2020 Stage prefix: Recurrence  Malignant neoplasm of upper-outer quadrant of right breast in female, estrogen receptor negative ([place]) Staging form: Breast, AJCC 8th Edition - Clinical: cT2, cN1, cM0, ER-, PR-, HER2- - Signed by [name], MD on 09/29/2020 Stage prefix: Initial diagnosis Nuclear grade: G3  The patient's subsequent history is as detailed below.   PAST MEDICAL HISTORY: Past Medical History:  Diagnosis Date  . Breast cancer [facility]) 2008   Right Breast Cancer  . Cancer ([place])   .  Family history of breast cancer   . Family history of prostate cancer   . Personal history of chemotherapy 2008    Right Breast Cancer  . Personal history of radiation therapy 2008   Right Breast Cancer    PAST SURGICAL HISTORY: Past Surgical History:  Procedure Laterality Date  . BREAST LUMPECTOMY Right 2008  . CESAREAN SECTION    . PORTACATH PLACEMENT Left 10/13/2020   Procedure: INSERTION PORT-A-CATH;  Surgeon: [name], MD;  Location: [facility];  Service: General;  Laterality: Left;  LEAVE PORT ACCESSED CHEMO STARTS ON 2/10, LEFT SIDE    FAMILY HISTORY: Family History  Problem Relation Age of Onset  . Breast cancer Maternal Grandmother 78  . Prostate cancer Maternal Grandfather        dx 90s, metastatic   Her father died from heart attack (possibly Covid?) at age 14. Her mother is age 38 as of 09/2020. Penny Brown has 2 brothers and 2 sisters. She reports breast cancer in her maternal grandmother in her late 56's.   GYNECOLOGIC HISTORY:  No LMP recorded. Menarche: 50 years old Age at first live birth: 50 years old [place] P 3 LMP 09/09/2020, periods are regular, 7 days with 4 heavy Contraceptive: s/p BTL with coils in place HRT n/a  Hysterectomy? no BSO? 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 Husband [name] is a Cabin crew. She lives at home with [name], daughter [name], age 23, and daughter [name], age 75 who works as a Curator. Son [name], age 13, is a Psychiatric nurse in [employer]. Karmina has one grandchild. She attends [employer].    ADVANCED DIRECTIVES: In the absence of any documentation to the contrary, the patient's spouse is their HCPOA.    HEALTH MAINTENANCE: Social History   Tobacco Use  . Smoking status: Never Smoker  . Smokeless tobacco: Never Used  Substance Use Topics  . Alcohol use: No  . Drug use: No     Colonoscopy: n/a (age)   PAP: 2019  Bone density: n/a (age)   No Known Allergies  Current Outpatient Medications  Medication Sig  Dispense Refill  . lidocaine-prilocaine (EMLA) cream Apply to affected area once 30 g 3  . LORazepam (ATIVAN) 0.5 MG tablet Take one tablet before coming to cancer center for chemo treatment 30 tablet 0  . prochlorperazine (COMPAZINE) 10 MG tablet Take 1 tablet (10 mg total) by mouth every 6 (six) hours as needed (Nausea or vomiting). 30 tablet 1  . traMADol (ULTRAM) 50 MG tablet Take 1 tablet (50 mg total) by mouth every 6 (six) hours as needed. 8 tablet 0   No current facility-administered medications for this visit.   Facility-Administered Medications Ordered in Other Visits  Medication Dose Route Frequency Provider Last Rate Last Admin  . sodium chloride flush (NS) 0.9 % injection 10 mL  10 mL Intracatheter PRN Dennette Faulconer, [name], MD   10 mL at 11/11/20 1750    OBJECTIVE: African-American woman who appears younger than stated age  85:   12/02/20 1216  BP: 109/67  Pulse: (!) 118  Resp: 18  Temp: 97.7 F (36.5 C)  SpO2: 100%     Body mass index is 30.31 kg/m.   Wt Readings from Last 3 Encounters:  12/02/20 165 lb 11.2 oz (75.2 kg)  11/25/20 164 lb 6.4 oz (74.6 kg)  11/18/20 168 lb (76.2 kg)  ECOG FS:1 - Symptomatic but completely ambulatory  Sclerae unicteric, EOMs intact Wearing a mask No cervical or supraclavicular adenopathy Lungs no rales or rhonchi Heart regular rate and rhythm Abd soft, nontender, positive bowel sounds MSK no focal spinal tenderness, no upper extremity lymphedema Neuro: nonfocal, well oriented, appropriate affect Breasts: I no longer palpate a mass in the medial aspect of the right breast.   LAB RESULTS:  CMP     Component Value Date/Time   NA 141 12/02/2020 1200   K 4.2 12/02/2020 1200   CL 107 12/02/2020 1200   CO2 24 12/02/2020 1200   GLUCOSE 109 (H) 12/02/2020 1200   BUN 13 12/02/2020 1200   CREATININE 0.75 12/02/2020 1200   CREATININE 0.71 10/28/2020 1135   CALCIUM 8.8 (L) 12/02/2020 1200   PROT 7.0 12/02/2020 1200    ALBUMIN 3.7 12/02/2020 1200   AST 17 12/02/2020 1200   AST 18 10/28/2020 1135   ALT 19 12/02/2020 1200   ALT 20 10/28/2020 1135   ALKPHOS 77 12/02/2020 1200   BILITOT 0.2 (L) 12/02/2020 1200   BILITOT 0.3 10/28/2020 1135   GFRNONAA >60 12/02/2020 1200   GFRNONAA >60 10/28/2020 1135   GFRAA  04/11/2007 1225    >60        The eGFR has been calculated using the MDRD equation. This calculation has not been validated in all clinical    No results found for: TOTALPROTELP, ALBUMINELP, A1GS, A2GS, BETS, BETA2SER, GAMS, MSPIKE, SPEI  Lab Results  Component Value Date   WBC 2.5 (L) 12/02/2020   NEUTROABS 0.9 (L) 12/02/2020   HGB 8.9 (L) 12/02/2020   HCT 28.3 (L) 12/02/2020   MCV 89.0 12/02/2020   PLT 220 12/02/2020    Lab Results  Component Value Date   LABCA2 17 11/25/2007    No components found for: [id number]  No results for input(s): INR in the last 168 hours.  Lab Results  Component Value Date   LABCA2 17 11/25/2007    No results found for: HFS142  No results found for: LTR320  No results found for: EBX435  No results found for: CA2729  No components found for: HGQUANT  No results found for: CEA1 / No results found for: CEA1   No results found for: AFPTUMOR  No results found for: CHROMOGRNA  No results found for: KPAFRELGTCHN, LAMBDASER, KAPLAMBRATIO (kappa/lambda light chains)  No results found for: HGBA, HGBA2QUANT, HGBFQUANT, HGBSQUAN (Hemoglobinopathy evaluation)   Lab Results  Component Value Date   LDH 156 11/25/2007    No results found for: IRON, TIBC, IRONPCTSAT (Iron and TIBC)  No results found for: FERRITIN  Urinalysis    Component Value Date/Time   LABSPEC 1.015 03/13/2017 1905   PHURINE 7.0 03/13/2017 1905   GLUCOSEU NEGATIVE 03/13/2017 1905   HGBUR MODERATE (A) 03/13/2017 1905   BILIRUBINUR NEGATIVE 03/13/2017 [place] 03/13/2017 1905   PROTEINUR NEGATIVE 03/13/2017 1905   UROBILINOGEN 0.2 03/13/2017 1905    NITRITE NEGATIVE 03/13/2017 1905   LEUKOCYTESUR MODERATE (A) 03/13/2017 1905    STUDIES: No results found.   ELIGIBLE FOR AVAILABLE RESEARCH PROTOCOL: no  ASSESSMENT: 50 y.o. [place] woman  (1) status post right breast upper outer quadrant lumpectomy and sentinel lymph node sampling 04/15/2007 for a pT2 pN1, stage IIIB invasive ductal carcinoma, grade 3, triple negative, with an MIB-1 of 59%  (a) adjuvant chemotherapy consisted of doxorubicin and cyclophosphamide in dose dense fashion x4 followed by weekly paclitaxel x12  (b) received adjuvant  radiation  (c) a total of 4 right axillary lymph nodes were removed  (2) status post right breast upper inner quadrant biopsy 09/22/2020 for a clinical T2 N2, stage IIIC invasive ductal carcinoma, grade 3, triple negative, with an MIB-1 of 80%.  (a) chest CT 10/28/2020 shows indeterminate 0.3 cm or smaller pulmonary nodules  (b) bone scan 10/29/2020 shows no evidence of metatstatic disease  (3) genetics testing 10/19/2020 through the Common hereditary Cancers panel offered by [facility] shows no deleterious mutations  (4) neoadjuvant chemotherapy started 10/14/2020 consisting of pembrolizumab given every 3 weeks, with carboplatin + paclitaxel given weekly, 3 weeks on, one week off, for 4 cycles (12 carbo/Taxol doses) as tolerated  (5) definitive surgery to follow  (6) continue pembrolizumab to total one year   PLAN:  Leasa is tolerating chemotherapy generally well.  She has had a hint of possible peripheral neuropathy but this appears to have cleared.  Her counts have been trending steadily down.  This is however on off week so I am hoping by next week she will have recovered somewhat so we can get through a third cycle of treatment.  The goal of course is 4 cycles (12 doses of paclitaxel and carboplatin) with the [place] continuing for a year.  I have encouraged her to exercise regularly.  I have encouraged that the mass in the right  breast is not easily palpable at present.  There is some stranding there which may be a residual).  She will return in 1 week for chemotherapy and in 2 weeks for chemo and Keytruda.  I will see her in 2 weeks.  She knows to call for any other issue that may develop before then.  Total encounter time 25 minutes.Sarajane Jews C. Lacye Mccarn, MD 12/02/20 1:15 PM Medical Oncology and Hematology [facility] [place], [place] [address] Tel. [phone]    Fax. [phone]   I, Wilburn Mylar, am acting as scribe for Dr. [name]. Azia Toutant.  I, Lurline Del MD, have reviewed the above documentation for accuracy and completeness, and I agree with the above.   *Total Encounter Time as defined by the Centers for Medicare and Medicaid Services includes, in addition to the face-to-face time of a patient visit (documented in the note above) non-face-to-face time: obtaining and reviewing outside history, ordering and reviewing medications, tests or procedures, care coordination (communications with other health care professionals or caregivers) and documentation in the medical record.

## 2020-12-02 ENCOUNTER — Other Ambulatory Visit: Payer: 59

## 2020-12-02 ENCOUNTER — Other Ambulatory Visit: Payer: Self-pay

## 2020-12-02 ENCOUNTER — Inpatient Hospital Stay: Payer: 59

## 2020-12-02 ENCOUNTER — Ambulatory Visit: Payer: 59

## 2020-12-02 ENCOUNTER — Inpatient Hospital Stay (HOSPITAL_BASED_OUTPATIENT_CLINIC_OR_DEPARTMENT_OTHER): Payer: 59 | Admitting: Oncology

## 2020-12-02 VITALS — BP 109/67 | HR 118 | Temp 97.7°F | Resp 18 | Ht 62.0 in | Wt 165.7 lb

## 2020-12-02 DIAGNOSIS — C50211 Malignant neoplasm of upper-inner quadrant of right female breast: Secondary | ICD-10-CM | POA: Diagnosis not present

## 2020-12-02 DIAGNOSIS — Z171 Estrogen receptor negative status [ER-]: Secondary | ICD-10-CM

## 2020-12-02 DIAGNOSIS — C50411 Malignant neoplasm of upper-outer quadrant of right female breast: Secondary | ICD-10-CM | POA: Diagnosis not present

## 2020-12-02 DIAGNOSIS — Z95828 Presence of other vascular implants and grafts: Secondary | ICD-10-CM

## 2020-12-02 LAB — CBC WITH DIFFERENTIAL/PLATELET
Abs Immature Granulocytes: 0.01 10*3/uL (ref 0.00–0.07)
Basophils Absolute: 0 10*3/uL (ref 0.0–0.1)
Basophils Relative: 0 %
Eosinophils Absolute: 0 10*3/uL (ref 0.0–0.5)
Eosinophils Relative: 0 %
HCT: 28.3 % — ABNORMAL LOW (ref 36.0–46.0)
Hemoglobin: 8.9 g/dL — ABNORMAL LOW (ref 12.0–15.0)
Immature Granulocytes: 0 %
Lymphocytes Relative: 60 %
Lymphs Abs: 1.5 10*3/uL (ref 0.7–4.0)
MCH: 28 pg (ref 26.0–34.0)
MCHC: 31.4 g/dL (ref 30.0–36.0)
MCV: 89 fL (ref 80.0–100.0)
Monocytes Absolute: 0.2 10*3/uL (ref 0.1–1.0)
Monocytes Relative: 6 %
Neutro Abs: 0.9 10*3/uL — ABNORMAL LOW (ref 1.7–7.7)
Neutrophils Relative %: 34 %
Platelets: 220 10*3/uL (ref 150–400)
RBC: 3.18 MIL/uL — ABNORMAL LOW (ref 3.87–5.11)
RDW: 14.8 % (ref 11.5–15.5)
WBC: 2.5 10*3/uL — ABNORMAL LOW (ref 4.0–10.5)
nRBC: 0 % (ref 0.0–0.2)

## 2020-12-02 LAB — COMPREHENSIVE METABOLIC PANEL
ALT: 19 U/L (ref 0–44)
AST: 17 U/L (ref 15–41)
Albumin: 3.7 g/dL (ref 3.5–5.0)
Alkaline Phosphatase: 77 U/L (ref 38–126)
Anion gap: 10 (ref 5–15)
BUN: 13 mg/dL (ref 6–20)
CO2: 24 mmol/L (ref 22–32)
Calcium: 8.8 mg/dL — ABNORMAL LOW (ref 8.9–10.3)
Chloride: 107 mmol/L (ref 98–111)
Creatinine, Ser: 0.75 mg/dL (ref 0.44–1.00)
GFR, Estimated: >60 mL/min (ref >60)
Glucose, Bld: 109 mg/dL — ABNORMAL HIGH (ref 70–99)
Potassium: 4.2 mmol/L (ref 3.5–5.1)
Sodium: 141 mmol/L (ref 135–145)
Total Bilirubin: 0.2 mg/dL — ABNORMAL LOW (ref 0.3–1.2)
Total Protein: 7 g/dL (ref 6.5–8.1)

## 2020-12-02 MED ORDER — SODIUM CHLORIDE 0.9% FLUSH
10.0000 mL | Freq: Once | INTRAVENOUS | Status: AC
  Administered 2020-12-02: 10 mL
  Filled 2020-12-02: qty 10

## 2020-12-06 ENCOUNTER — Telehealth: Payer: Self-pay | Admitting: Oncology

## 2020-12-06 NOTE — Telephone Encounter (Signed)
Scheduled appts per 3/31 los. Called pt, no answer. Left msg with appts dates and times.

## 2020-12-09 ENCOUNTER — Other Ambulatory Visit: Payer: Self-pay

## 2020-12-09 ENCOUNTER — Inpatient Hospital Stay: Payer: 59

## 2020-12-09 ENCOUNTER — Inpatient Hospital Stay: Payer: 59 | Attending: Oncology

## 2020-12-09 ENCOUNTER — Encounter: Payer: Self-pay | Admitting: *Deleted

## 2020-12-09 VITALS — BP 129/80 | HR 98 | Temp 98.4°F | Resp 18 | Wt 168.8 lb

## 2020-12-09 DIAGNOSIS — Z95828 Presence of other vascular implants and grafts: Secondary | ICD-10-CM

## 2020-12-09 DIAGNOSIS — T451X5A Adverse effect of antineoplastic and immunosuppressive drugs, initial encounter: Secondary | ICD-10-CM | POA: Diagnosis not present

## 2020-12-09 DIAGNOSIS — Z171 Estrogen receptor negative status [ER-]: Secondary | ICD-10-CM

## 2020-12-09 DIAGNOSIS — C50211 Malignant neoplasm of upper-inner quadrant of right female breast: Secondary | ICD-10-CM | POA: Insufficient documentation

## 2020-12-09 DIAGNOSIS — R5383 Other fatigue: Secondary | ICD-10-CM | POA: Diagnosis not present

## 2020-12-09 DIAGNOSIS — Z5111 Encounter for antineoplastic chemotherapy: Secondary | ICD-10-CM | POA: Diagnosis not present

## 2020-12-09 DIAGNOSIS — G620 Drug-induced polyneuropathy: Secondary | ICD-10-CM | POA: Diagnosis not present

## 2020-12-09 DIAGNOSIS — G629 Polyneuropathy, unspecified: Secondary | ICD-10-CM | POA: Diagnosis not present

## 2020-12-09 DIAGNOSIS — D649 Anemia, unspecified: Secondary | ICD-10-CM | POA: Diagnosis not present

## 2020-12-09 LAB — CBC WITH DIFFERENTIAL/PLATELET
Abs Immature Granulocytes: 0.05 10*3/uL (ref 0.00–0.07)
Basophils Absolute: 0 10*3/uL (ref 0.0–0.1)
Basophils Relative: 0 %
Eosinophils Absolute: 0 10*3/uL (ref 0.0–0.5)
Eosinophils Relative: 0 %
HCT: 27.9 % — ABNORMAL LOW (ref 36.0–46.0)
Hemoglobin: 9 g/dL — ABNORMAL LOW (ref 12.0–15.0)
Immature Granulocytes: 2 %
Lymphocytes Relative: 51 %
Lymphs Abs: 1.5 10*3/uL (ref 0.7–4.0)
MCH: 29.1 pg (ref 26.0–34.0)
MCHC: 32.3 g/dL (ref 30.0–36.0)
MCV: 90.3 fL (ref 80.0–100.0)
Monocytes Absolute: 0.3 10*3/uL (ref 0.1–1.0)
Monocytes Relative: 11 %
Neutro Abs: 1.1 10*3/uL — ABNORMAL LOW (ref 1.7–7.7)
Neutrophils Relative %: 36 %
Platelets: 189 10*3/uL (ref 150–400)
RBC: 3.09 MIL/uL — ABNORMAL LOW (ref 3.87–5.11)
RDW: 16.6 % — ABNORMAL HIGH (ref 11.5–15.5)
WBC: 3 10*3/uL — ABNORMAL LOW (ref 4.0–10.5)
nRBC: 0 % (ref 0.0–0.2)

## 2020-12-09 LAB — COMPREHENSIVE METABOLIC PANEL
ALT: 20 U/L (ref 0–44)
AST: 18 U/L (ref 15–41)
Albumin: 3.7 g/dL (ref 3.5–5.0)
Alkaline Phosphatase: 83 U/L (ref 38–126)
Anion gap: 9 (ref 5–15)
BUN: 10 mg/dL (ref 6–20)
CO2: 22 mmol/L (ref 22–32)
Calcium: 8.7 mg/dL — ABNORMAL LOW (ref 8.9–10.3)
Chloride: 108 mmol/L (ref 98–111)
Creatinine, Ser: 0.74 mg/dL (ref 0.44–1.00)
GFR, Estimated: >60 mL/min (ref >60)
Glucose, Bld: 105 mg/dL — ABNORMAL HIGH (ref 70–99)
Potassium: 4.2 mmol/L (ref 3.5–5.1)
Sodium: 139 mmol/L (ref 135–145)
Total Bilirubin: <0.2 mg/dL — ABNORMAL LOW (ref 0.3–1.2)
Total Protein: 6.9 g/dL (ref 6.5–8.1)

## 2020-12-09 LAB — TSH: TSH: 1.878 u[IU]/mL (ref 0.308–3.960)

## 2020-12-09 MED ORDER — SODIUM CHLORIDE 0.9 % IV SOLN
20.0000 mg | Freq: Once | INTRAVENOUS | Status: AC
  Administered 2020-12-09: 20 mg via INTRAVENOUS
  Filled 2020-12-09: qty 2
  Filled 2020-12-09: qty 20

## 2020-12-09 MED ORDER — HEPARIN SOD (PORK) LOCK FLUSH 100 UNIT/ML IV SOLN
500.0000 [IU] | Freq: Once | INTRAVENOUS | Status: AC | PRN
  Administered 2020-12-09: 500 [IU]
  Filled 2020-12-09: qty 5

## 2020-12-09 MED ORDER — PALONOSETRON HCL INJECTION 0.25 MG/5ML
INTRAVENOUS | Status: AC
  Filled 2020-12-09: qty 5

## 2020-12-09 MED ORDER — SODIUM CHLORIDE 0.9 % IV SOLN
80.0000 mg/m2 | Freq: Once | INTRAVENOUS | Status: AC
  Administered 2020-12-09: 150 mg via INTRAVENOUS
  Filled 2020-12-09: qty 25

## 2020-12-09 MED ORDER — FAMOTIDINE IN NACL 20-0.9 MG/50ML-% IV SOLN
INTRAVENOUS | Status: AC
  Filled 2020-12-09: qty 50

## 2020-12-09 MED ORDER — DIPHENHYDRAMINE HCL 50 MG/ML IJ SOLN
25.0000 mg | Freq: Once | INTRAMUSCULAR | Status: AC
  Administered 2020-12-09: 25 mg via INTRAVENOUS

## 2020-12-09 MED ORDER — SODIUM CHLORIDE 0.9% FLUSH
10.0000 mL | Freq: Once | INTRAVENOUS | Status: AC
  Administered 2020-12-09: 10 mL
  Filled 2020-12-09: qty 10

## 2020-12-09 MED ORDER — SODIUM CHLORIDE 0.9% FLUSH
10.0000 mL | INTRAVENOUS | Status: DC | PRN
  Administered 2020-12-09: 10 mL
  Filled 2020-12-09: qty 10

## 2020-12-09 MED ORDER — PALONOSETRON HCL INJECTION 0.25 MG/5ML
0.2500 mg | Freq: Once | INTRAVENOUS | Status: AC
  Administered 2020-12-09: 0.25 mg via INTRAVENOUS

## 2020-12-09 MED ORDER — SODIUM CHLORIDE 0.9 % IV SOLN
Freq: Once | INTRAVENOUS | Status: AC
Start: 2020-12-09 — End: 2020-12-09
  Filled 2020-12-09: qty 250

## 2020-12-09 MED ORDER — FAMOTIDINE IN NACL 20-0.9 MG/50ML-% IV SOLN
20.0000 mg | Freq: Once | INTRAVENOUS | Status: AC
  Administered 2020-12-09: 20 mg via INTRAVENOUS

## 2020-12-09 MED ORDER — DIPHENHYDRAMINE HCL 50 MG/ML IJ SOLN
INTRAMUSCULAR | Status: AC
  Filled 2020-12-09: qty 1

## 2020-12-09 MED ORDER — SODIUM CHLORIDE 0.9 % IV SOLN
255.0000 mg | Freq: Once | INTRAVENOUS | Status: AC
  Administered 2020-12-09: 260 mg via INTRAVENOUS
  Filled 2020-12-09: qty 26

## 2020-12-09 NOTE — Progress Notes (Signed)
Per Dr. Jana Hakim, ok to proceed with ANC 1.1. Advised no dose adjustments.

## 2020-12-09 NOTE — Patient Instructions (Signed)
[facility] Discharge Instructions for Patients Receiving Chemotherapy  Today you received the following chemotherapy agents: paclitaxel and carboplatin.  To help prevent nausea and vomiting after your treatment, we encourage you to take your nausea medication as directed.   If you develop nausea and vomiting that is not controlled by your nausea medication, call the clinic.   BELOW ARE SYMPTOMS THAT SHOULD BE REPORTED IMMEDIATELY:  *FEVER GREATER THAN 100.5 F  *CHILLS WITH OR WITHOUT FEVER  NAUSEA AND VOMITING THAT IS NOT CONTROLLED WITH YOUR NAUSEA MEDICATION  *UNUSUAL SHORTNESS OF BREATH  *UNUSUAL BRUISING OR BLEEDING  TENDERNESS IN MOUTH AND THROAT WITH OR WITHOUT PRESENCE OF ULCERS  *URINARY PROBLEMS  *BOWEL PROBLEMS  UNUSUAL RASH Items with * indicate a potential emergency and should be followed up as soon as possible.  Feel free to call the clinic should you have any questions or concerns. The clinic phone number is [phone].  Please show the CHEMO ALERT CARD at check-in to the Emergency Department and triage nurse.   

## 2020-12-14 ENCOUNTER — Other Ambulatory Visit: Payer: Self-pay | Admitting: Oncology

## 2020-12-15 ENCOUNTER — Telehealth: Payer: Self-pay | Admitting: Oncology

## 2020-12-15 NOTE — Telephone Encounter (Signed)
Scheduled appt sper 4/12 sch msg. Called pt, no answer. Left msg with appts dates and times. Will have updated calendar printed for pt at next visit.

## 2020-12-16 ENCOUNTER — Encounter: Payer: Self-pay | Admitting: *Deleted

## 2020-12-16 ENCOUNTER — Inpatient Hospital Stay: Payer: 59 | Admitting: Oncology

## 2020-12-16 ENCOUNTER — Inpatient Hospital Stay: Payer: 59

## 2020-12-17 ENCOUNTER — Inpatient Hospital Stay (HOSPITAL_BASED_OUTPATIENT_CLINIC_OR_DEPARTMENT_OTHER): Payer: 59 | Admitting: Hematology

## 2020-12-17 ENCOUNTER — Other Ambulatory Visit: Payer: Self-pay | Admitting: Hematology

## 2020-12-17 ENCOUNTER — Other Ambulatory Visit: Payer: 59

## 2020-12-17 ENCOUNTER — Other Ambulatory Visit: Payer: Self-pay

## 2020-12-17 ENCOUNTER — Inpatient Hospital Stay: Payer: 59

## 2020-12-17 VITALS — Wt 166.5 lb

## 2020-12-17 DIAGNOSIS — Z171 Estrogen receptor negative status [ER-]: Secondary | ICD-10-CM

## 2020-12-17 DIAGNOSIS — C50211 Malignant neoplasm of upper-inner quadrant of right female breast: Secondary | ICD-10-CM

## 2020-12-17 DIAGNOSIS — Z95828 Presence of other vascular implants and grafts: Secondary | ICD-10-CM

## 2020-12-17 LAB — COMPREHENSIVE METABOLIC PANEL
ALT: 18 U/L (ref 0–44)
AST: 20 U/L (ref 15–41)
Albumin: 3.7 g/dL (ref 3.5–5.0)
Alkaline Phosphatase: 85 U/L (ref 38–126)
Anion gap: 10 (ref 5–15)
BUN: 10 mg/dL (ref 6–20)
CO2: 23 mmol/L (ref 22–32)
Calcium: 9 mg/dL (ref 8.9–10.3)
Chloride: 106 mmol/L (ref 98–111)
Creatinine, Ser: 0.77 mg/dL (ref 0.44–1.00)
GFR, Estimated: >60 mL/min (ref >60)
Glucose, Bld: 96 mg/dL (ref 70–99)
Potassium: 4.9 mmol/L (ref 3.5–5.1)
Sodium: 139 mmol/L (ref 135–145)
Total Bilirubin: <0.2 mg/dL — ABNORMAL LOW (ref 0.3–1.2)
Total Protein: 7.1 g/dL (ref 6.5–8.1)

## 2020-12-17 LAB — CBC WITH DIFFERENTIAL/PLATELET
Abs Immature Granulocytes: 0.08 10*3/uL — ABNORMAL HIGH (ref 0.00–0.07)
Basophils Absolute: 0 10*3/uL (ref 0.0–0.1)
Basophils Relative: 1 %
Eosinophils Absolute: 0 10*3/uL (ref 0.0–0.5)
Eosinophils Relative: 0 %
HCT: 27 % — ABNORMAL LOW (ref 36.0–46.0)
Hemoglobin: 8.7 g/dL — ABNORMAL LOW (ref 12.0–15.0)
Immature Granulocytes: 2 %
Lymphocytes Relative: 42 %
Lymphs Abs: 1.8 10*3/uL (ref 0.7–4.0)
MCH: 29.1 pg (ref 26.0–34.0)
MCHC: 32.2 g/dL (ref 30.0–36.0)
MCV: 90.3 fL (ref 80.0–100.0)
Monocytes Absolute: 0.3 10*3/uL (ref 0.1–1.0)
Monocytes Relative: 7 %
Neutro Abs: 2 10*3/uL (ref 1.7–7.7)
Neutrophils Relative %: 48 %
Platelets: 175 10*3/uL (ref 150–400)
RBC: 2.99 MIL/uL — ABNORMAL LOW (ref 3.87–5.11)
RDW: 16.2 % — ABNORMAL HIGH (ref 11.5–15.5)
WBC: 4.2 10*3/uL (ref 4.0–10.5)
nRBC: 0 % (ref 0.0–0.2)

## 2020-12-17 LAB — TSH: TSH: 2.061 u[IU]/mL (ref 0.308–3.960)

## 2020-12-17 MED ORDER — PALONOSETRON HCL INJECTION 0.25 MG/5ML
0.2500 mg | Freq: Once | INTRAVENOUS | Status: AC
  Administered 2020-12-17: 0.25 mg via INTRAVENOUS

## 2020-12-17 MED ORDER — FAMOTIDINE IN NACL 20-0.9 MG/50ML-% IV SOLN
20.0000 mg | Freq: Once | INTRAVENOUS | Status: AC
  Administered 2020-12-17: 20 mg via INTRAVENOUS

## 2020-12-17 MED ORDER — SODIUM CHLORIDE 0.9 % IV SOLN
200.0000 mg | Freq: Once | INTRAVENOUS | Status: AC
  Administered 2020-12-17: 200 mg via INTRAVENOUS
  Filled 2020-12-17: qty 8

## 2020-12-17 MED ORDER — DIPHENHYDRAMINE HCL 50 MG/ML IJ SOLN
INTRAMUSCULAR | Status: AC
  Filled 2020-12-17: qty 1

## 2020-12-17 MED ORDER — SODIUM CHLORIDE 0.9% FLUSH
10.0000 mL | Freq: Once | INTRAVENOUS | Status: DC
  Filled 2020-12-17: qty 10

## 2020-12-17 MED ORDER — PALONOSETRON HCL INJECTION 0.25 MG/5ML
INTRAVENOUS | Status: AC
  Filled 2020-12-17: qty 5

## 2020-12-17 MED ORDER — SODIUM CHLORIDE 0.9 % IV SOLN
50.0000 mg/m2 | Freq: Once | INTRAVENOUS | Status: AC
  Administered 2020-12-17: 96 mg via INTRAVENOUS
  Filled 2020-12-17: qty 16

## 2020-12-17 MED ORDER — SODIUM CHLORIDE 0.9% FLUSH
10.0000 mL | INTRAVENOUS | Status: DC | PRN
  Administered 2020-12-17: 10 mL
  Filled 2020-12-17: qty 10

## 2020-12-17 MED ORDER — FAMOTIDINE IN NACL 20-0.9 MG/50ML-% IV SOLN
INTRAVENOUS | Status: AC
  Filled 2020-12-17: qty 50

## 2020-12-17 MED ORDER — SODIUM CHLORIDE 0.9 % IV SOLN
Freq: Once | INTRAVENOUS | Status: AC
  Filled 2020-12-17: qty 250

## 2020-12-17 MED ORDER — SODIUM CHLORIDE 0.9 % IV SOLN
10.0000 mg | Freq: Once | INTRAVENOUS | Status: AC
  Administered 2020-12-17: 10 mg via INTRAVENOUS
  Filled 2020-12-17: qty 10

## 2020-12-17 MED ORDER — SODIUM CHLORIDE 0.9 % IV SOLN
255.0000 mg | Freq: Once | INTRAVENOUS | Status: AC
  Administered 2020-12-17: 260 mg via INTRAVENOUS
  Filled 2020-12-17: qty 26

## 2020-12-17 MED ORDER — DIPHENHYDRAMINE HCL 50 MG/ML IJ SOLN
25.0000 mg | Freq: Once | INTRAMUSCULAR | Status: AC
  Administered 2020-12-17: 25 mg via INTRAVENOUS

## 2020-12-17 MED ORDER — HEPARIN SOD (PORK) LOCK FLUSH 100 UNIT/ML IV SOLN
500.0000 [IU] | Freq: Once | INTRAVENOUS | Status: AC | PRN
  Administered 2020-12-17: 500 [IU]
  Filled 2020-12-17: qty 5

## 2020-12-17 NOTE — Patient Instructions (Signed)
[facility] Discharge Instructions for Patients Receiving Chemotherapy  Per Dr. Burr Medico, you may begin taking over-the-counter MiraLAX (or generic) 1/2 cap every other day. If results are unsatisfactory, may increase MiraLAX to 1/2 cap-1 cap every day.   Begin taking B-complex vitamin as directed to improve nerve health.  For rash, Dr. Burr Medico recommends taking daily over-the-counter antihistamine such as Zyrtec or Claritin (or generic). May also apply topical steroid cream to affected area as directed.  Today you received the following chemotherapy agents: pembrolizumab, paclitaxel, and carboplatin.  To help prevent nausea and vomiting after your treatment, we encourage you to take your nausea medication as directed.   If you develop nausea and vomiting that is not controlled by your nausea medication, call the clinic.   BELOW ARE SYMPTOMS THAT SHOULD BE REPORTED IMMEDIATELY:  *FEVER GREATER THAN 100.5 F  *CHILLS WITH OR WITHOUT FEVER  NAUSEA AND VOMITING THAT IS NOT CONTROLLED WITH YOUR NAUSEA MEDICATION  *UNUSUAL SHORTNESS OF BREATH  *UNUSUAL BRUISING OR BLEEDING  TENDERNESS IN MOUTH AND THROAT WITH OR WITHOUT PRESENCE OF ULCERS  *URINARY PROBLEMS  *BOWEL PROBLEMS  UNUSUAL RASH Items with * indicate a potential emergency and should be followed up as soon as possible.  Feel free to call the clinic should you have any questions or concerns. The clinic phone number is [phone].  Please show the [place] at check-in to the Emergency Department and triage nurse.

## 2020-12-17 NOTE — Progress Notes (Signed)
Patient presented to infusion today c/o persistent neuropathy in bilateral hands, persistent constipation requiring daily use of stool softeners, and two night h/o urticarial rash to bilateral lower extremities. Discussed patient's complaints with Dr. Burr Medico, as Dr. Jana Hakim is not in the clinic today. Dr. Burr Medico agreed to see patient in the infusion room. Dr. Burr Medico advised patient to take daily OTC antihistamine such as Claritin or Zyrtec (or generic) and to apply topical steroid cream as needed. Dr. Burr Medico also advised patient to begin using MiraLAX as directed. Discussed this with patient, and she verbalized understanding.

## 2020-12-17 NOTE — Progress Notes (Signed)
Penny Brown presents today for port flush, lab draw per the provider's orders. Patient  Remained stable during flush and lab draw.  No questions or complaints noted at this time.

## 2020-12-20 ENCOUNTER — Encounter: Payer: Self-pay | Admitting: Hematology

## 2020-12-20 NOTE — Progress Notes (Addendum)
Penny Brown   [phone] [phone]   Clinic Follow up Note   Patient Care Team: Penny Hawks, NP as PCP - General (Family Medicine) Penny Kaufmann, RN as Oncology Nurse Navigator Penny Germany, RN as Oncology Nurse Navigator Penny Brown, Penny Dad, MD as Consulting Physician (Oncology) Penny Bookbinder, MD as Consulting Physician (General Surgery) Penny Rudd, MD as Consulting Physician (Radiation Oncology) 12/17/2020  CHIEF COMPLAINT: worsening peripheral neuropathy   SUMMARY OF ONCOLOGIC HISTORY: Oncology History  Malignant neoplasm of upper-inner quadrant of right breast in female, estrogen receptor negative ([place])  09/27/2020 Initial Diagnosis   Malignant neoplasm of upper-inner quadrant of right breast in female, estrogen receptor negative ([place])   09/29/2020 Cancer Staging   Staging form: Breast, AJCC 8th Edition - Clinical stage from 09/29/2020: Stage IIB (rcT2, cN0, cM0, G3, ER-, PR-, HER2-) - Signed by [name], MD on 09/29/2020   10/14/2020 -  Chemotherapy    Patient is on Treatment Plan: BREAST CARBOPLATIN D1,8,15 + PACLITAXEL D1,8,15 Q28D X 4 CYCLES   Patient is on Antibody Plan: BREAST PEMBROLIZUMAB Q21D    10/19/2020 Genetic Testing   Negative genetic testing:  No pathogenic variants detected on the [facility] Common Hereditary Cancers panel. The report date is 10/19/2020.   The Common Hereditary Cancers Panel offered by [facility] includes sequencing and/or deletion duplication testing of the following 48 genes: APC, ATM, AXIN2, BARD1, BMPR1A, BRCA1, BRCA2, BRIP1, CDH1, CDK4, CDKN2A (p14ARF), CDKN2A (p16INK4a), CHEK2, CTNNA1, DICER1, EPCAM (Deletion/duplication testing only), GREM1 (promoter region deletion/duplication testing only), KIT, MEN1, MLH1, MSH2, MSH3, MSH6, MUTYH, NBN, NF1, NTHL1, PALB2, PDGFRA, PMS2, POLD1, POLE, PTEN, RAD50, RAD51C, RAD51D, RNF43, SDHB, SDHC, SDHD, SMAD4, SMARCA4. STK11, TP53, TSC1, TSC2, and VHL.  The  following genes were evaluated for sequence changes only: SDHA and HOXB13 c.251G>A variant only.   11/04/2020 -  Chemotherapy    Patient is on Treatment Plan: BREAST CARBOPLATIN D1,8,15 + PACLITAXEL D1,8,15 Q28D X 4 CYCLES   Patient is on Antibody Plan: BREAST PEMBROLIZUMAB Q21D    Malignant neoplasm of upper-outer quadrant of right breast in female, estrogen receptor negative ([place])  09/29/2020 Initial Diagnosis   Malignant neoplasm of upper-outer quadrant of right breast in female, estrogen receptor negative ([place])   09/29/2020 Cancer Staging   Staging form: Breast, AJCC 8th Edition - Clinical: cT2, cN1, cM0, ER-, PR-, HER2- - Signed by [name], MD on 09/29/2020   10/19/2020 Genetic Testing   Negative genetic testing:  No pathogenic variants detected on the [facility] Common Hereditary Cancers panel. The report date is 10/19/2020.   The Common Hereditary Cancers Panel offered by [facility] includes sequencing and/or deletion duplication testing of the following 48 genes: APC, ATM, AXIN2, BARD1, BMPR1A, BRCA1, BRCA2, BRIP1, CDH1, CDK4, CDKN2A (p14ARF), CDKN2A (p16INK4a), CHEK2, CTNNA1, DICER1, EPCAM (Deletion/duplication testing only), GREM1 (promoter region deletion/duplication testing only), KIT, MEN1, MLH1, MSH2, MSH3, MSH6, MUTYH, NBN, NF1, NTHL1, PALB2, PDGFRA, PMS2, POLD1, POLE, PTEN, RAD50, RAD51C, RAD51D, RNF43, SDHB, SDHC, SDHD, SMAD4, SMARCA4. STK11, TP53, TSC1, TSC2, and VHL.  The following genes were evaluated for sequence changes only: SDHA and HOXB13 c.251G>A variant only.     CURRENT THERAPY: neoadjuvant chemo weekly [place] and taxol and [place]   INTERVAL HISTORY: Penny Brown came in today for cycle 3-day 8 neoadjuvant chemotherapy, I was called by her infusion nurse Penny Brown to evaluate her worsening neuropathy.  She has numbness and tingling on her fingers and toes, previously was intermittent, it has been persistent for the past  2 weeks, and slightly getting worse.  No dropping or  balance issue, she is still able to function well.  The tingling is tolerable.  She otherwise tolerating chemotherapy well, with mild fatigue, no other new complaints.  All other systems were reviewed with the patient and are negative.  MEDICAL HISTORY:  Past Medical History:  Diagnosis Date  . Breast cancer [facility]) 2008   Right Breast Cancer  . Cancer ([place])   . Family history of breast cancer   . Family history of prostate cancer   . Personal history of chemotherapy 2008   Right Breast Cancer  . Personal history of radiation therapy 2008   Right Breast Cancer    SURGICAL HISTORY: Past Surgical History:  Procedure Laterality Date  . BREAST LUMPECTOMY Right 2008  . CESAREAN SECTION    . PORTACATH PLACEMENT Left 10/13/2020   Procedure: INSERTION PORT-A-CATH;  Surgeon: Penny Bookbinder, MD;  Location: [facility];  Service: General;  Laterality: Left;  LEAVE PORT ACCESSED CHEMO STARTS ON 2/10, LEFT SIDE    I have reviewed the social history and family history with the patient and they are unchanged from previous note.  ALLERGIES:  has No Known Allergies.  MEDICATIONS:  Current Outpatient Medications  Medication Sig Dispense Refill  . lidocaine-prilocaine (EMLA) cream Apply to affected area once 30 g 3  . LORazepam (ATIVAN) 0.5 MG tablet Take one tablet before coming to cancer center for chemo treatment 30 tablet 0  . prochlorperazine (COMPAZINE) 10 MG tablet Take 1 tablet (10 mg total) by mouth every 6 (six) hours as needed (Nausea or vomiting). 30 tablet 1  . traMADol (ULTRAM) 50 MG tablet Take 1 tablet (50 mg total) by mouth every 6 (six) hours as needed. 8 tablet 0   No current facility-administered medications for this visit.   Facility-Administered Medications Ordered in Other Visits  Medication Dose Route Frequency Provider Last Rate Last Admin  . sodium chloride flush (NS) 0.9 % injection 10 mL  10 mL Intracatheter PRN Penny Brown, Penny Dad, MD   10 mL at  11/11/20 1750    PHYSICAL EXAMINATION: ECOG PERFORMANCE STATUS: 1 - Symptomatic but completely ambulatory Weight 166 pounds GENERAL:alert, no distress and comfortable SKIN: skin color, texture, turgor are normal, no rashes or significant lesions EYES: normal, Conjunctiva are pink and non-injected, sclera clear Musculoskeletal:no cyanosis of digits and no clubbing  NEURO: alert & oriented x 3 with fluent speech, slightly decreased vibration sensation on bilateral hands, more on left side, no weakness   LABORATORY DATA:  I have reviewed the data as listed CBC Latest Ref Rng & Units 12/17/2020 12/09/2020 12/02/2020  WBC 4.0 - 10.5 K/uL 4.2 3.0(L) 2.5(L)  Hemoglobin 12.0 - 15.0 g/dL 8.7(L) 9.0(L) 8.9(L)  Hematocrit 36.0 - 46.0 % 27.0(L) 27.9(L) 28.3(L)  Platelets 150 - 400 K/uL 175 189 220     CMP Latest Ref Rng & Units 12/17/2020 12/09/2020 12/02/2020  Glucose 70 - 99 mg/dL 96 105(H) 109(H)  BUN 6 - 20 mg/dL 10 10 13   Creatinine 0.44 - 1.00 mg/dL 0.77 0.74 0.75  Sodium 135 - 145 mmol/L 139 139 141  Potassium 3.5 - 5.1 mmol/L 4.9 4.2 4.2  Chloride 98 - 111 mmol/L 106 108 107  CO2 22 - 32 mmol/L 23 22 24   Calcium 8.9 - 10.3 mg/dL 9.0 8.7(L) 8.8(L)  Total Protein 6.5 - 8.1 g/dL 7.1 6.9 7.0  Total Bilirubin 0.3 - 1.2 mg/dL <0.2(L) <0.2(L) 0.2(L)  Alkaline Phos 38 - 126  U/L 85 83 77  AST 15 - 41 U/L 20 18 17   ALT 0 - 44 U/L 18 20 19       RADIOGRAPHIC STUDIES: I have personally reviewed the radiological images as listed and agreed with the findings in the report. No results found.   ASSESSMENT & PLAN:  50 year old female  1.  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 Anemia   Plan -Patient has developed worsening peripheral neuropathy secondary to chemotherapy Taxol -I recommend dose reduction of Taxol to 50 mg/m today -Continue cryotherapy  for hands and feet during chemo infusion -I encouraged her to take over-the-counter B complex, and exercise  -Anemia is also slightly getting worse, she is asymptomatic, no need of blood transfusion for now  -she will return next week for f/u and chemo  -if neuropathy gets much worse, may have to stop Taxol earlier. -I will inform her primary oncologist Dr. Jana Hakim  All questions were answered. The patient knows to call the clinic with any problems, questions or concerns. No barriers to learning was detected. I spent 25 minutes counseling the patient face to face. The total time spent in the appointment was 30 minutes and more than 50% was on counseling and review of test results     Truitt Merle, MD 12/17/2020

## 2020-12-21 ENCOUNTER — Other Ambulatory Visit: Payer: Self-pay | Admitting: Oncology

## 2020-12-21 DIAGNOSIS — C50211 Malignant neoplasm of upper-inner quadrant of right female breast: Secondary | ICD-10-CM

## 2020-12-21 DIAGNOSIS — Z171 Estrogen receptor negative status [ER-]: Secondary | ICD-10-CM

## 2020-12-21 NOTE — Progress Notes (Unsigned)
I called the patient and left her a message that I am very concerned about peripheral neuropathy.  We are canceling her treatment this Friday a 12/24/2020 and we are setting her up for a breast MRI so we can assess to how far we need to go at this point.

## 2020-12-22 ENCOUNTER — Telehealth: Payer: Self-pay | Admitting: Oncology

## 2020-12-22 ENCOUNTER — Encounter (HOSPITAL_COMMUNITY): Payer: Self-pay | Admitting: Oncology

## 2020-12-22 NOTE — Telephone Encounter (Signed)
Cancelled appts per 4/19 sch msg. Pt is aware appts were cancelled. Pt wanted to know why appts were cancelled, I sent a msg for the nurse to reach out to pt.

## 2020-12-24 ENCOUNTER — Other Ambulatory Visit: Payer: Self-pay | Admitting: Oncology

## 2020-12-24 ENCOUNTER — Other Ambulatory Visit: Payer: 59

## 2020-12-24 ENCOUNTER — Inpatient Hospital Stay: Payer: 59

## 2020-12-24 ENCOUNTER — Inpatient Hospital Stay: Payer: 59 | Admitting: Medical

## 2020-12-24 ENCOUNTER — Ambulatory Visit
Admission: RE | Admit: 2020-12-24 | Discharge: 2020-12-24 | Disposition: A | Discharge status: Home / Self Care | Payer: 59 | Source: Ambulatory Visit | Attending: Oncology | Admitting: Oncology

## 2020-12-24 DIAGNOSIS — C50211 Malignant neoplasm of upper-inner quadrant of right female breast: Secondary | ICD-10-CM

## 2020-12-24 DIAGNOSIS — Z171 Estrogen receptor negative status [ER-]: Secondary | ICD-10-CM

## 2020-12-24 MED ORDER — GADOBUTROL 1 MMOL/ML IV SOLN
8.0000 mL | Freq: Once | INTRAVENOUS | Status: AC | PRN
  Administered 2020-12-24: 8 mL via INTRAVENOUS

## 2020-12-24 NOTE — Progress Notes (Signed)
I called the patient so she could understand why we canceled the treatment today.  She is having her breast MRI today.  I will call her Monday or Tuesday with results and at that point we will decide whether to continue chemo or proceed directly to surgery.

## 2020-12-26 ENCOUNTER — Encounter: Payer: Self-pay | Admitting: Oncology

## 2020-12-29 ENCOUNTER — Other Ambulatory Visit: Payer: Self-pay | Admitting: Oncology

## 2020-12-29 NOTE — Progress Notes (Deleted)
[facility] OFFICE PROGRESS NOTE  Penny Hawks, NP [address] [place] [address]  DIAGNOSIS: recurrent vs. new triple negative breast cancer  Oncology History  Malignant neoplasm of upper-inner quadrant of right breast in female, estrogen receptor negative (Penny Brown)  09/27/2020 Initial Diagnosis   Malignant neoplasm of upper-inner quadrant of right breast in female, estrogen receptor negative (Penny Brown)   09/29/2020 Cancer Staging   Staging form: Breast, AJCC 8th Edition - Clinical stage from 09/29/2020: Stage IIB (rcT2, cN0, cM0, G3, ER-, PR-, HER2-) - Signed by Penny Cruel, MD on 09/29/2020   10/14/2020 -  Chemotherapy    Patient is on Treatment Plan: BREAST CARBOPLATIN D1,8,15 + PACLITAXEL D1,8,15 Q28D X 4 CYCLES   Patient is on Antibody Plan: BREAST PEMBROLIZUMAB Q21D    10/19/2020 Genetic Testing   Negative genetic testing:  No pathogenic variants detected on the [facility] Common Hereditary Cancers panel. The report date is 10/19/2020.   The Common Hereditary Cancers Panel offered by [facility] includes sequencing and/or deletion duplication testing of the following 48 genes: APC, ATM, AXIN2, BARD1, BMPR1A, BRCA1, BRCA2, BRIP1, CDH1, CDK4, CDKN2A (p14ARF), CDKN2A (p16INK4a), CHEK2, CTNNA1, DICER1, EPCAM (Deletion/duplication testing only), GREM1 (promoter region deletion/duplication testing only), KIT, MEN1, MLH1, MSH2, MSH3, MSH6, MUTYH, NBN, NF1, NTHL1, PALB2, PDGFRA, PMS2, POLD1, POLE, PTEN, RAD50, RAD51C, RAD51D, RNF43, SDHB, SDHC, SDHD, SMAD4, SMARCA4. STK11, TP53, TSC1, TSC2, and VHL.  The following genes were evaluated for sequence changes only: SDHA and HOXB13 c.251G>A variant only.   11/04/2020 -  Chemotherapy    Patient is on Treatment Plan: BREAST CARBOPLATIN D1,8,15 + PACLITAXEL D1,8,15 Q28D X 4 CYCLES   Patient is on Antibody Plan: BREAST PEMBROLIZUMAB Q21D    Malignant neoplasm of upper-outer quadrant of right breast in female, estrogen receptor  negative (Penny Brown)  09/29/2020 Initial Diagnosis   Malignant neoplasm of upper-outer quadrant of right breast in female, estrogen receptor negative (Penny Brown)   09/29/2020 Cancer Staging   Staging form: Breast, AJCC 8th Edition - Clinical: cT2, cN1, cM0, ER-, PR-, HER2- - Signed by Penny Cruel, MD on 09/29/2020   10/19/2020 Genetic Testing   Negative genetic testing:  No pathogenic variants detected on the [facility] Common Hereditary Cancers panel. The report date is 10/19/2020.   The Common Hereditary Cancers Panel offered by [facility] includes sequencing and/or deletion duplication testing of the following 48 genes: APC, ATM, AXIN2, BARD1, BMPR1A, BRCA1, BRCA2, BRIP1, CDH1, CDK4, CDKN2A (p14ARF), CDKN2A (p16INK4a), CHEK2, CTNNA1, DICER1, EPCAM (Deletion/duplication testing only), GREM1 (promoter region deletion/duplication testing only), KIT, MEN1, MLH1, MSH2, MSH3, MSH6, MUTYH, NBN, NF1, NTHL1, PALB2, PDGFRA, PMS2, POLD1, POLE, PTEN, RAD50, RAD51C, RAD51D, RNF43, SDHB, SDHC, SDHD, SMAD4, SMARCA4. STK11, TP53, TSC1, TSC2, and VHL.  The following genes were evaluated for sequence changes only: SDHA and HOXB13 c.251G>A variant only.     CURRENT THERAPY: neoadjuvant chemotherapy, consisting of pembrolizumab, carboplatin, and paclitaxel on 10/14/2020.  She receives the carbo/taxol weekly x3 followed by 1 week break. Chemotherapy was discontinued on 12/24/20 after developing neuropathy. She will continue on Keytruda, next dose due on 01/06/21.   INTERVAL HISTORY: Penny Brown 50 y.o. female returns to clinic today for a follow-up visit. The patient was last seen in the clinic on 12/17/2020.  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 Jana Brown recommended cancelling her treatment on 12/24/20. He set her up for a breast MRI to restage her disease. Dr. Jana Brown recommended  discontinuing her chemotherapy if she had a good treatment  response to treatment and being referred to surgery.   At her last appointment, she also had a rash. She took _ which _ the rash. She denies symptoms of infection. She denies nausea, vomiting, diarrhea, or constipation. She denies fever, chills, night sweats, or weight loss. She is here to review her MRI and to discuss the plan moving forward.        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 She received 4 cycles of doxorubicin and cyclophosphamide followed by 4 cycles of of paclitaxel, which was discontinued secondary to neuropathy. She was released from follow up here in 2012.  More recently, she presented with a palpable upper-inner right breast mass. She underwent bilateral diagnostic mammography with tomography and right breast ultrasonography at [facility] on 09/22/2020 showing: breast density category A; 2.2 cm irregular mass in upper-inner right breast; no significant abnormalities in right axilla.  Accordingly on 09/22/2020 she proceeded to biopsy of the right breast area in question. The pathology from this procedure ([id number]) showed: invasive ductal carcinoma, grade 3. Prognostic indicators significant for: estrogen receptor, 0% negative and progesterone receptor, 0% negative. Proliferation marker Ki67 at 80%. HER2 negatve by immunohistochemistry (1+).  Cancer Staging Malignant neoplasm of upper-inner quadrant of right breast in female, estrogen receptor negative ([place]) Staging form: Breast, AJCC 8th Edition - Clinical stage from 09/29/2020: Stage IIB (rcT2, cN0, cM0, G3, ER-, PR-, HER2-) - Signed by Penny Cruel, MD on 09/29/2020 Stage prefix: Recurrence  Malignant neoplasm of upper-outer quadrant of right  breast in female, estrogen receptor negative ([place]) Staging form: Breast, AJCC 8th Edition - Clinical: cT2, cN1, cM0, ER-, PR-, HER2- - Signed by Penny Cruel, MD on 09/29/2020 Stage prefix: Initial diagnosis Nuclear grade: G3  The patient's subsequent history is as detailed below.   MEDICAL HISTORY: Past Medical History:  Diagnosis Date  . Breast cancer [facility]) 2008   Right Breast Cancer  . Cancer ([place])   . Family history of breast cancer   . Family history of prostate cancer   . Personal history of chemotherapy 2008   Right Breast Cancer  . Personal history of radiation therapy 2008   Right Breast Cancer    ALLERGIES:  has No Known Allergies.  MEDICATIONS:  Current Outpatient Medications  Medication Sig Dispense Refill  . lidocaine-prilocaine (EMLA) cream Apply to affected area once 30 g 3  . LORazepam (ATIVAN) 0.5 MG tablet Take one tablet before coming to cancer center for chemo treatment 30 tablet 0  . prochlorperazine (COMPAZINE) 10 MG tablet Take 1 tablet (10 mg total) by mouth every 6 (six) hours as needed (Nausea or vomiting). 30 tablet 1  . traMADol (ULTRAM) 50 MG tablet Take 1 tablet (50 mg total) by mouth every 6 (six) hours as needed. 8 tablet 0   No current facility-administered medications for this visit.   Facility-Administered Medications Ordered in Other Visits  Medication Dose Route Frequency Provider Last Rate Last Admin  . sodium chloride flush (NS) 0.9 % injection 10 mL  10 mL Intracatheter PRN Magrinat, [name], MD   10 mL at 11/11/20 1750    SURGICAL HISTORY:  Past Surgical History:  Procedure Laterality Date  . BREAST LUMPECTOMY Right 2008  .  CESAREAN SECTION    . PORTACATH PLACEMENT Left 10/13/2020   Procedure: INSERTION PORT-A-CATH;  Surgeon: [name], MD;  Location: [facility];  Service: General;  Laterality: Left;  LEAVE PORT ACCESSED CHEMO STARTS ON 2022-11-03, LEFT SIDE   Her father died from heart attack (possibly  Covid?) at age 58. Her mother is age 12 as of 09/2020. Penny Brown has 2 brothers and 2 sisters. She reports breast cancer in her maternal grandmother in her late 39's.   GYNECOLOGIC HISTORY:  No LMP recorded. Menarche: 50 years old Age at first live birth: 50 years old [place] P 3 LMP 09/09/2020, periods are regular, 7 days with 4 heavy Contraceptive: s/p BTL with coils in place HRT n/a Hysterectomy? no BSO? 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 Husband [name] is a Cabin crew. She lives at home with [name], daughter [name], age 51, and daughter [name], age 79 who works as a Curator. Son [name], age 29, is a Psychiatric nurse in [employer]. Penny Brown has one grandchild. She attends [employer].                          ADVANCED DIRECTIVES: In the absence of any documentation to the contrary, the patient's spouse is their HCPOA.   REVIEW OF SYSTEMS:   Review of Systems  Constitutional: Negative for appetite change, chills, fatigue, fever and unexpected weight change.  HENT:   Negative for mouth sores, nosebleeds, sore throat and trouble swallowing.   Eyes: Negative for eye problems and icterus.  Respiratory: Negative for cough, hemoptysis, shortness of breath and wheezing.   Cardiovascular: Negative for chest pain and leg swelling.  Gastrointestinal: Negative for abdominal pain, constipation, diarrhea, nausea and vomiting.  Genitourinary: Negative for bladder incontinence, difficulty urinating, dysuria, frequency and hematuria.   Musculoskeletal: Negative for back pain, gait problem, neck pain and neck stiffness.  Skin: Negative for itching and rash.  Neurological: Negative for dizziness, extremity weakness, gait problem, headaches, light-headedness and seizures.  Hematological: Negative for adenopathy. Does not bruise/bleed easily.  Psychiatric/Behavioral: Negative for confusion, depression  and sleep disturbance. The patient is not nervous/anxious.     PHYSICAL EXAMINATION:  There were no vitals taken for this visit.  ECOG PERFORMANCE STATUS: {CHL ONC ECOG [id number]  Physical Exam  Constitutional: Oriented to person, place, and time and well-developed, well-nourished, and in no distress. No distress.  HENT:  Head: Normocephalic and atraumatic.  Mouth/Throat: Oropharynx is clear and moist. No oropharyngeal exudate.  Eyes: Conjunctivae are normal. Right eye exhibits no discharge. Left eye exhibits no discharge. No scleral icterus.  Neck: Normal range of motion. Neck supple.  Cardiovascular: Normal rate, regular rhythm, normal heart sounds and intact distal pulses.   Pulmonary/Chest: Effort normal and breath sounds normal. No respiratory distress. No wheezes. No rales.  Abdominal: Soft. Bowel sounds are normal. Exhibits no distension and no mass. There is no tenderness.  Musculoskeletal: Normal range of motion. Exhibits no edema.  Lymphadenopathy:    No cervical adenopathy.  Neurological: Alert and oriented to person, place, and time. Exhibits normal muscle tone. Gait normal. Coordination normal.  Skin: Skin is warm and dry. No rash noted. Not diaphoretic. No erythema. No pallor.  Psychiatric: Mood, memory and judgment normal.  Vitals reviewed.  LABORATORY DATA: Lab Results  Component Value Date   WBC 4.2 12/17/2020   HGB 8.7 (L) 12/17/2020  HCT 27.0 (L) 12/17/2020   MCV 90.3 12/17/2020   PLT 175 12/17/2020      Chemistry      Component Value Date/Time   NA 139 12/17/2020 0752   K 4.9 12/17/2020 0752   CL 106 12/17/2020 0752   CO2 23 12/17/2020 0752   BUN 10 12/17/2020 0752   CREATININE 0.77 12/17/2020 0752   CREATININE 0.71 10/28/2020 1135      Component Value Date/Time   CALCIUM 9.0 12/17/2020 0752   ALKPHOS 85 12/17/2020 0752   AST 20 12/17/2020 0752   AST 18 10/28/2020 1135   ALT 18 12/17/2020 0752   ALT 20 10/28/2020 1135   BILITOT <0.2 (L)  12/17/2020 0752   BILITOT 0.3 10/28/2020 1135       RADIOGRAPHIC STUDIES:  MR BREAST BILATERAL W WO CONTRAST INC CAD  Result Date: 12/24/2020 CLINICAL DATA:  Post chemotherapy imaging prior to surgery. LABS:  None EXAM: BILATERAL BREAST MRI WITH AND WITHOUT CONTRAST TECHNIQUE: Multiplanar, multisequence MR images of both breasts were obtained prior to and following the intravenous administration of 8 ml of Gadavist Three-dimensional MR images were rendered by post-processing of the original MR data on an independent workstation. The three-dimensional MR images were interpreted, and findings are reported in the following complete MRI report for this study. Three dimensional images were evaluated at the independent interpreting workstation using the DynaCAD thin client. COMPARISON:  October 08, 2020 breast MRI FINDINGS: Breast composition: b. Scattered fibroglandular tissue. Background parenchymal enhancement: Mild Right breast: The susceptibility artifact from post biopsy clip is again identified. On T1 and T2 images, there is much less soft tissue surrounding the clips consistent with improvement. There is also significant improvement of surrounding enhancement with only a tiny amount of subtle enhancement superior to the clip. There has been near complete resolution of the previously identified malignancy. Left breast: No mass or abnormal enhancement. Lymph nodes: No abnormal appearing lymph nodes. Ancillary findings:  None. IMPRESSION: 1. Only minimal subtle enhancement remains at the site of the patient's known malignancy, just superior to the clips left at biopsy. The soft tissue surrounding the clips seen on the previous MRI has essentially resolved as well. The findings are consistent with near complete resolution of the previously identified malignancy. 2. No evidence of malignancy in the left breast. RECOMMENDATION: Recommend continued surgical and oncologic follow up. BI-RADS CATEGORY  6: Known  biopsy-proven malignancy. Electronically Signed   By: Dorise Bullion III M.D   On: 12/24/2020 12:38     ASSESSMENT/PLAN:  ELIGIBLE FOR AVAILABLE RESEARCH PROTOCOL: no  ASSESSMENT: 50 y.o. [place] woman  (1) status post right breast upper outer quadrant lumpectomy and sentinel lymph node sampling 04/15/2007 for a pT2 pN1, stage IIIB invasive ductal carcinoma, grade 3, triple negative, with an MIB-1 of 59%             (a) adjuvant chemotherapy consisted of doxorubicin and cyclophosphamide in dose dense fashion x4 followed by weekly paclitaxel x12             (b) received adjuvant radiation             (c) a total of 4 right axillary lymph nodes were removed  (2) status post right breast upper inner quadrant biopsy 09/22/2020 for a clinical T2 N2, stage IIIC invasive ductal carcinoma, grade 3, triple negative, with an MIB-1 of 80%.             (a) chest CT 10/28/2020 shows indeterminate 0.3 cm  or smaller pulmonary nodules             (b) bone scan 10/29/2020 shows no evidence of metatstatic disease  (3) genetics testing 10/19/2020 through the Common hereditary Cancers panel offered by [facility] shows no deleterious mutations  (4) neoadjuvant chemotherapy started 10/14/2020 consisting of pembrolizumab given every 3 weeks, with carboplatin + paclitaxel given weekly, 3 weeks on, one week off, for 4 cycles (12 carbo/Taxol doses) as tolerated. This was discontinued due to peripheral neuropathy. Last dose on 12/17/20.   (5) definitive surgery to follow  (6) continue pembrolizumab to total one year  PLAN: Leina's plan was discussed with Dr. Jana Brown. He officially discontinued chemotherapy due to her developing peripheral neuropathy and having a favorable repeat MRI. Her MRI showed only minimal subtle enhancement remains at the site of the patient's known malignancy, just superior to the clips left at biopsy. The soft tissue surrounding the clips seen on the previous MRI has  essentially resolved as well. The findings are consistent with near complete resolution of the previously identified malignancy. I reviewed this with the patient. She is in agreement with the plan.   She is due for her next infusion for Keytruda next week on 01/06/21.   We will reach out to surgery to facilitate _.   The patient was advised to call immediately if she has any concerning symptoms in the interval. The patient voices understanding of current disease status and treatment options and is in agreement with the current care plan. All questions were answered. The patient knows to call the clinic with any problems, questions or concerns. We can certainly see the patient much sooner if necessary     No orders of the defined types were placed in this encounter.    I spent {CHL ONC TIME VISIT - SVXBL:[id number]} counseling the patient face to face. The total time spent in the appointment was {CHL ONC TIME VISIT - AQTMA:[id number]}.  Kaz Auld L Gissela Bloch, PA-C 12/29/20

## 2020-12-30 ENCOUNTER — Telehealth: Payer: Self-pay

## 2020-12-30 ENCOUNTER — Other Ambulatory Visit: Payer: Self-pay | Admitting: Oncology

## 2020-12-30 MED FILL — Dexamethasone Sodium Phosphate Inj 100 MG/10ML: INTRAMUSCULAR | Qty: 2 | Status: AC

## 2020-12-30 NOTE — Progress Notes (Signed)
I called Penny Brown and discussed her MRI with her.  The results are very favorable.  She understands she will continue the Keytruda at least a year.  If she can keep her port that would be convenient but if they need to remove the port for surgical reasons or cosmesis we can give her the pembrolizumab peripherally.  I have alerted her surgeon that she is ready for surgery at his discretion

## 2020-12-30 NOTE — Telephone Encounter (Signed)
Returned call to pt. Pt was under the impression Dr Jana Hakim was going to call and follow up with MRI results and possible new plan. Told pt I would ask MD. When MD notified he said he would call pt and asked this RN to cancel pt's appointments for tomorrow for infusion and to see PA. MD to provide new POC.

## 2020-12-31 ENCOUNTER — Ambulatory Visit: Payer: 59

## 2020-12-31 ENCOUNTER — Other Ambulatory Visit: Payer: 59

## 2020-12-31 ENCOUNTER — Encounter: Payer: Self-pay | Admitting: *Deleted

## 2020-12-31 ENCOUNTER — Ambulatory Visit: Payer: 59 | Admitting: Physician Assistant

## 2020-12-31 ENCOUNTER — Telehealth: Payer: Self-pay | Admitting: *Deleted

## 2020-12-31 NOTE — Telephone Encounter (Signed)
Spoke with patient and gave her appointment to see Dr. Donne Hazel 5/12 at 4:15 to discuss sx options.Patient verbalized understanding.

## 2021-01-05 ENCOUNTER — Telehealth (HOSPITAL_COMMUNITY): Payer: Self-pay | Admitting: Oncology

## 2021-01-05 NOTE — Telephone Encounter (Signed)
Just an FYI. We have made several attempts to contact this patient including sending a letter to schedule or reschedule their echocardiogram. We will be removing the patient from the echo WQ.  MAILED LETTER [facility]  12/16/20 [facility] to schedule @ 11:55/[facility]  12/15/20 [facility] to schedule @ 9:53/[facility]  12/06/20 [facility] to schedule @ 10:06/[facility]W       Thank you

## 2021-01-06 ENCOUNTER — Telehealth: Payer: Self-pay | Admitting: Oncology

## 2021-01-06 ENCOUNTER — Encounter: Payer: Self-pay | Admitting: *Deleted

## 2021-01-06 NOTE — Telephone Encounter (Signed)
Scheduled appts per 5/5 sch msg. Pt aware.  

## 2021-01-12 MED FILL — Dexamethasone Sodium Phosphate Inj 100 MG/10ML: INTRAMUSCULAR | Qty: 2 | Status: AC

## 2021-01-12 NOTE — Progress Notes (Signed)
[place]  [phone]) [phone] [phone]) [phone]    ID: Penny Brown DOB: 1971/01/19  MR#: [id number]  SLH#:[id number]  Patient Care Team: Penny Hawks, NP as PCP - General (Family Medicine) Penny Kaufmann, RN as Oncology Nurse Navigator Penny Germany, RN as Oncology Nurse Navigator Penny Brown, Penny Dad, MD as Consulting Physician (Oncology) Penny Bookbinder, MD as Consulting Physician (General Surgery) Penny Rudd, MD as Consulting Physician (Radiation Oncology) Penny Cruel, MD OTHER MD:  CHIEF COMPLAINT: recurrent vs. new triple negative breast cancer  CURRENT TREATMENT: Pembrolizumab; definitive surgery pending   INTERVAL HISTORY: Penny Brown returns today for follow up and treatment of her second triple negative breast cancer.  She is accompanied by her husband can  Since her last visit, she underwent breast MRI on 12/24/2020 showing: breast composition B; only minimal subtle enhancement remains at site of patient's known malignancy; no evidence of malignancy in left breast.  Her case was represented at the multidisciplinary breast cancer conference 01/05/2021.  At that point surgery suggested mastectomy and attempted repeat sentinel lymph node sampling.  No adjuvant radiation  She continues on pembrolizumab every 21 days.  She had her most recent dose 12/17/2020 and has a dose due today.   REVIEW OF SYSTEMS: Penny Brown is already feeling better, with more energy, although she has not yet started a regular exercise program.  She does have a stationary bike at home.  She has less nausea as well.  She has no unusual headaches visual changes cough phlegm production pleurisy shortness of breath or change in bowel or bladder habits.  A detailed review of systems today was otherwise stable.    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 She received 4 cycles of doxorubicin and cyclophosphamide followed by 4 cycles of of paclitaxel, which was discontinued secondary to neuropathy. She was released from follow up here in 2012.  More recently, she presented with a palpable upper-inner right breast mass. She underwent bilateral diagnostic mammography with tomography and right breast ultrasonography at [facility] on 09/22/2020 showing: breast density category A; 2.2 cm irregular mass in upper-inner right breast; no significant abnormalities in right axilla.  Accordingly on 09/22/2020 she proceeded to biopsy of the right breast area in question. The pathology from this procedure ([id number]) showed: invasive ductal carcinoma, grade 3. Prognostic indicators significant for: estrogen receptor, 0% negative and progesterone receptor, 0% negative. Proliferation marker Ki67 at 80%. HER2 negatve by immunohistochemistry (1+).  Cancer Staging Malignant neoplasm of upper-inner quadrant of right breast in female, estrogen receptor negative ([place]) Staging form: Breast, AJCC 8th Edition - Clinical stage from 09/29/2020: Stage IIB (rcT2, cN0, cM0, G3, ER-, PR-, HER2-) - Signed by Penny Cruel, MD on 09/29/2020 Stage prefix: Recurrence  Malignant neoplasm of upper-outer quadrant of right breast in female, estrogen receptor negative ([place]) Staging form: Breast, AJCC 8th Edition - Clinical: cT2, cN1, cM0, ER-, PR-, HER2- - Signed by Penny Cruel, MD on 09/29/2020 Stage prefix: Initial diagnosis Nuclear grade: G3  The patient's subsequent history is as detailed below.   PAST MEDICAL HISTORY: Past Medical History:  Diagnosis Date  . Breast cancer [facility]) 2008   Right Breast Cancer  . Cancer ([place])   . Family history of breast cancer   . Family  history of prostate cancer   . Personal history of  chemotherapy 2008   Right Breast Cancer  . Personal history of radiation therapy 2008   Right Breast Cancer    PAST SURGICAL HISTORY: Past Surgical History:  Procedure Laterality Date  . BREAST LUMPECTOMY Right 2008  . CESAREAN SECTION    . PORTACATH PLACEMENT Left 10/13/2020   Procedure: INSERTION PORT-A-CATH;  Surgeon: Penny Bookbinder, MD;  Location: [place];  Service: General;  Laterality: Left;  LEAVE PORT ACCESSED CHEMO STARTS ON 2/10, LEFT SIDE    FAMILY HISTORY: Family History  Problem Relation Age of Onset  . Breast cancer Maternal Grandmother 78  . Prostate cancer Maternal Grandfather        dx 53s, metastatic   Her father died from heart attack (possibly Covid?) at age 70. Her mother is age 83 as of 09/2020. Penny Brown has 2 brothers and 2 sisters. She reports breast cancer in her maternal grandmother in her late 28's.   GYNECOLOGIC HISTORY:  No LMP recorded. Menarche: 50 years old Age at first live birth: 50 years old [place] P 3 LMP 09/09/2020, periods are regular, 7 days with 4 heavy Contraceptive: s/p BTL with coils in place HRT n/a  Hysterectomy? no BSO? 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 She works from home. Husband Penny Brown is a Cabin crew. She lives at home with Penny Brown, daughter Penny Brown, age 51, and daughter Penny Brown, age 60 who works as a Curator. Son Penny Brown, age 73, is a Psychiatric nurse in [employer]. Penny Brown has one grandchild. She attends [employer].    ADVANCED DIRECTIVES: In the absence of any documentation to the contrary, the patient's spouse is their HCPOA.    HEALTH MAINTENANCE: Social History   Tobacco Use  . Smoking status: Never Smoker  . Smokeless tobacco: Never Used  Substance Use Topics  . Alcohol use: No  . Drug use: No     Colonoscopy: n/a (age)   PAP: 2019  Bone density: n/a (age)   No Known Allergies  Current Outpatient Medications   Medication Sig Dispense Refill  . LORazepam (ATIVAN) 0.5 MG tablet Take one tablet before coming to cancer center for chemo treatment 30 tablet 0  . traMADol (ULTRAM) 50 MG tablet Take 1 tablet (50 mg total) by mouth every 6 (six) hours as needed. 8 tablet 0   No current facility-administered medications for this visit.    OBJECTIVE: African-American woman who appears younger than stated age  26:   01/13/21 0849  BP: 122/80  Pulse: (!) 106  Resp: 18  Temp: (!) 97.3 F (36.3 C)  SpO2: 100%     Body mass index is 30.25 kg/m.   Wt Readings from Last 3 Encounters:  01/13/21 165 lb 6.4 oz (75 kg)  12/17/20 166 lb 8 oz (75.5 kg)  12/09/20 168 lb 12.8 oz (76.6 kg)     ECOG FS:1 - Symptomatic but completely ambulatory  Sclerae unicteric, EOMs intact Wearing a mask No cervical or supraclavicular adenopathy Lungs no rales or rhonchi Heart regular rate and rhythm Abd soft, nontender, positive bowel sounds MSK no focal spinal tenderness, no upper extremity lymphedema Neuro: nonfocal, well oriented, appropriate affect Breasts: I do not palpate a mass in the right breast.  The left breast and both axillae are benign   LAB RESULTS:  CMP     Component Value Date/Time   NA 139 01/13/2021  0832   K 3.9 01/13/2021 0832   CL 106 01/13/2021 0832   CO2 24 01/13/2021 0832   GLUCOSE 99 01/13/2021 0832   BUN 13 01/13/2021 0832   CREATININE 0.82 01/13/2021 0832   CREATININE 0.71 10/28/2020 1135   CALCIUM 9.3 01/13/2021 0832   PROT 7.2 01/13/2021 0832   ALBUMIN 3.7 01/13/2021 0832   AST 17 01/13/2021 0832   AST 18 10/28/2020 1135   ALT 12 01/13/2021 0832   ALT 20 10/28/2020 1135   ALKPHOS 89 01/13/2021 0832   BILITOT 0.2 (L) 01/13/2021 0832   BILITOT 0.3 10/28/2020 1135   GFRNONAA >60 01/13/2021 0832   GFRNONAA >60 10/28/2020 1135   GFRAA  04/11/2007 1225    >60        The eGFR has been calculated using the MDRD equation. This calculation has not been validated in all  clinical    No results found for: TOTALPROTELP, ALBUMINELP, A1GS, A2GS, BETS, BETA2SER, GAMS, MSPIKE, SPEI  Lab Results  Component Value Date   WBC 4.5 01/13/2021   NEUTROABS 2.6 01/13/2021   HGB 9.7 (L) 01/13/2021   HCT 30.5 (L) 01/13/2021   MCV 92.7 01/13/2021   PLT 157 01/13/2021    Lab Results  Component Value Date   LABCA2 17 11/25/2007    No components found for: [id number]  No results for input(s): INR in the last 168 hours.  Lab Results  Component Value Date   LABCA2 17 11/25/2007    No results found for: NFA213  No results found for: YQM578  No results found for: ION629  No results found for: CA2729  No components found for: HGQUANT  No results found for: CEA1 / No results found for: CEA1   No results found for: AFPTUMOR  No results found for: CHROMOGRNA  No results found for: KPAFRELGTCHN, LAMBDASER, KAPLAMBRATIO (kappa/lambda light chains)  No results found for: HGBA, HGBA2QUANT, HGBFQUANT, HGBSQUAN (Hemoglobinopathy evaluation)   Lab Results  Component Value Date   LDH 156 11/25/2007    No results found for: IRON, TIBC, IRONPCTSAT (Iron and TIBC)  No results found for: FERRITIN  Urinalysis    Component Value Date/Time   LABSPEC 1.015 03/13/2017 1905   PHURINE 7.0 03/13/2017 1905   GLUCOSEU NEGATIVE 03/13/2017 1905   HGBUR MODERATE (A) 03/13/2017 1905   BILIRUBINUR NEGATIVE 03/13/2017 1905   KETONESUR NEGATIVE 03/13/2017 1905   PROTEINUR NEGATIVE 03/13/2017 1905   UROBILINOGEN 0.2 03/13/2017 1905   NITRITE NEGATIVE 03/13/2017 1905   LEUKOCYTESUR MODERATE (A) 03/13/2017 1905    STUDIES: MR BREAST BILATERAL W WO CONTRAST INC CAD  Result Date: 12/24/2020 CLINICAL DATA:  Post chemotherapy imaging prior to surgery. LABS:  None EXAM: BILATERAL BREAST MRI WITH AND WITHOUT CONTRAST TECHNIQUE: Multiplanar, multisequence MR images of both breasts were obtained prior to and following the intravenous administration of 8 ml of Gadavist  Three-dimensional MR images were rendered by post-processing of the original MR data on an independent workstation. The three-dimensional MR images were interpreted, and findings are reported in the following complete MRI report for this study. Three dimensional images were evaluated at the independent interpreting workstation using the DynaCAD thin client. COMPARISON:  October 08, 2020 breast MRI FINDINGS: Breast composition: b. Scattered fibroglandular tissue. Background parenchymal enhancement: Mild Right breast: The susceptibility artifact from post biopsy clip is again identified. On T1 and T2 images, there is much less soft tissue surrounding the clips consistent with improvement. There is also significant improvement of surrounding enhancement with only a  tiny amount of subtle enhancement superior to the clip. There has been near complete resolution of the previously identified malignancy. Left breast: No mass or abnormal enhancement. Lymph nodes: No abnormal appearing lymph nodes. Ancillary findings:  None. IMPRESSION: 1. Only minimal subtle enhancement remains at the site of the patient's known malignancy, just superior to the clips left at biopsy. The soft tissue surrounding the clips seen on the previous MRI has essentially resolved as well. The findings are consistent with near complete resolution of the previously identified malignancy. 2. No evidence of malignancy in the left breast. RECOMMENDATION: Recommend continued surgical and oncologic follow up. BI-RADS CATEGORY  6: Known biopsy-proven malignancy. Electronically Signed   By: Dorise Bullion III M.D   On: 12/24/2020 12:38     ELIGIBLE FOR AVAILABLE RESEARCH PROTOCOL: no  ASSESSMENT: 50 y.o. [place] woman  (1) status post right breast upper outer quadrant lumpectomy and sentinel lymph node sampling 04/15/2007 for a pT2 pN1, stage IIIB invasive ductal carcinoma, grade 3, triple negative, with an MIB-1 of 59%  (a) adjuvant chemotherapy  consisted of doxorubicin and cyclophosphamide in dose dense fashion x4 followed by weekly paclitaxel x12  (b) received adjuvant radiation  (c) a total of 4 right axillary lymph nodes were removed  (2) status post right breast upper inner quadrant biopsy 09/22/2020 for a clinical T2 N2, stage IIIC invasive ductal carcinoma, grade 3, triple negative, with an MIB-1 of 80%.  (a) chest CT 10/28/2020 shows indeterminate 0.3 cm or smaller pulmonary nodules  (b) bone scan 10/29/2020 shows no evidence of metatstatic disease  (3) genetics testing 10/19/2020 through the Common hereditary Cancers panel offered by [facility] shows no deleterious mutations  (4) neoadjuvant chemotherapy started 10/14/2020 consisting of pembrolizumab given every 3 weeks, with carboplatin + paclitaxel given weekly, 3 weeks on, one week off, wityh 4 cycles planned (12 carbo/Taxol doses), last dose 12/17/2020  (a) final 4 cycles of chemotherapy omitted because of neuropathy  (b) breast MRI 12/24/2020 shows near complete resolution of malignancy  (5) definitive surgery pending  (6) continue pembrolizumab to total one year (9 post-op doses)   PLAN:  Williemae has completed her chemotherapy treatments.  She is continuing the pembrolizumab.  She is tolerating this well and today we reviewed its possible toxicities side effects and complications which do not include hair loss.  I showed her the images of her breast MRI which are very favorable.  She is meeting with Dr. Donne Hazel later today to plan her surgery.  She is concerned that she is not receiving chemo for several weeks before surgery but I reassured her that the immunotherapy she is receiving is extending her treatment effect  She will see me again on 02/24/2021 with her Keytruda dose that day.  By then we should have her definitive pathology results, which I anticipate will be favorable  Total encounter time 25 minutes.Sarajane Jews C. Teddy Pena, MD 01/13/21 9:26 AM Medical  Oncology and Hematology [facility] [place], [place] [address] Tel. [phone]    Fax. [phone]   I, Wilburn Mylar, am acting as scribe for Dr. Virgie Brown. Teia Freitas.  I, Lurline Del MD, have reviewed the above documentation for accuracy and completeness, and I agree with the above.   *Total Encounter Time as defined by the Centers for Medicare and Medicaid Services includes, in addition to the face-to-face time of a patient visit (documented in the note above) non-face-to-face time: obtaining and reviewing outside history, ordering and reviewing  medications, tests or procedures, care coordination (communications with other health care professionals or caregivers) and documentation in the medical record.

## 2021-01-13 ENCOUNTER — Other Ambulatory Visit: Payer: Self-pay | Admitting: Oncology

## 2021-01-13 ENCOUNTER — Inpatient Hospital Stay: Payer: 59 | Attending: Oncology

## 2021-01-13 ENCOUNTER — Other Ambulatory Visit: Payer: 59

## 2021-01-13 ENCOUNTER — Inpatient Hospital Stay (HOSPITAL_BASED_OUTPATIENT_CLINIC_OR_DEPARTMENT_OTHER): Payer: 59 | Admitting: Oncology

## 2021-01-13 ENCOUNTER — Inpatient Hospital Stay: Payer: 59

## 2021-01-13 ENCOUNTER — Other Ambulatory Visit: Payer: Self-pay

## 2021-01-13 ENCOUNTER — Encounter: Payer: Self-pay | Admitting: *Deleted

## 2021-01-13 VITALS — HR 100

## 2021-01-13 VITALS — BP 122/80 | HR 106 | Temp 97.3°F | Resp 18 | Ht 62.0 in | Wt 165.4 lb

## 2021-01-13 DIAGNOSIS — Z95828 Presence of other vascular implants and grafts: Secondary | ICD-10-CM

## 2021-01-13 DIAGNOSIS — Z171 Estrogen receptor negative status [ER-]: Secondary | ICD-10-CM

## 2021-01-13 DIAGNOSIS — C50411 Malignant neoplasm of upper-outer quadrant of right female breast: Secondary | ICD-10-CM

## 2021-01-13 DIAGNOSIS — Z8041 Family history of malignant neoplasm of ovary: Secondary | ICD-10-CM | POA: Diagnosis not present

## 2021-01-13 DIAGNOSIS — C50211 Malignant neoplasm of upper-inner quadrant of right female breast: Secondary | ICD-10-CM | POA: Diagnosis not present

## 2021-01-13 DIAGNOSIS — Z803 Family history of malignant neoplasm of breast: Secondary | ICD-10-CM | POA: Insufficient documentation

## 2021-01-13 DIAGNOSIS — Z5112 Encounter for antineoplastic immunotherapy: Secondary | ICD-10-CM | POA: Diagnosis not present

## 2021-01-13 LAB — TSH: TSH: 2.008 u[IU]/mL (ref 0.308–3.960)

## 2021-01-13 LAB — COMPREHENSIVE METABOLIC PANEL
ALT: 12 U/L (ref 0–44)
AST: 17 U/L (ref 15–41)
Albumin: 3.7 g/dL (ref 3.5–5.0)
Alkaline Phosphatase: 89 U/L (ref 38–126)
Anion gap: 9 (ref 5–15)
BUN: 13 mg/dL (ref 6–20)
CO2: 24 mmol/L (ref 22–32)
Calcium: 9.3 mg/dL (ref 8.9–10.3)
Chloride: 106 mmol/L (ref 98–111)
Creatinine, Ser: 0.82 mg/dL (ref 0.44–1.00)
GFR, Estimated: >60 mL/min (ref >60)
Glucose, Bld: 99 mg/dL (ref 70–99)
Potassium: 3.9 mmol/L (ref 3.5–5.1)
Sodium: 139 mmol/L (ref 135–145)
Total Bilirubin: 0.2 mg/dL — ABNORMAL LOW (ref 0.3–1.2)
Total Protein: 7.2 g/dL (ref 6.5–8.1)

## 2021-01-13 LAB — CBC WITH DIFFERENTIAL/PLATELET
Abs Immature Granulocytes: 0.03 10*3/uL (ref 0.00–0.07)
Basophils Absolute: 0 10*3/uL (ref 0.0–0.1)
Basophils Relative: 0 %
Eosinophils Absolute: 0.1 10*3/uL (ref 0.0–0.5)
Eosinophils Relative: 1 %
HCT: 30.5 % — ABNORMAL LOW (ref 36.0–46.0)
Hemoglobin: 9.7 g/dL — ABNORMAL LOW (ref 12.0–15.0)
Immature Granulocytes: 1 %
Lymphocytes Relative: 33 %
Lymphs Abs: 1.5 10*3/uL (ref 0.7–4.0)
MCH: 29.5 pg (ref 26.0–34.0)
MCHC: 31.8 g/dL (ref 30.0–36.0)
MCV: 92.7 fL (ref 80.0–100.0)
Monocytes Absolute: 0.3 10*3/uL (ref 0.1–1.0)
Monocytes Relative: 7 %
Neutro Abs: 2.6 10*3/uL (ref 1.7–7.7)
Neutrophils Relative %: 58 %
Platelets: 157 10*3/uL (ref 150–400)
RBC: 3.29 MIL/uL — ABNORMAL LOW (ref 3.87–5.11)
RDW: 16.3 % — ABNORMAL HIGH (ref 11.5–15.5)
WBC: 4.5 10*3/uL (ref 4.0–10.5)
nRBC: 0 % (ref 0.0–0.2)

## 2021-01-13 MED ORDER — SODIUM CHLORIDE 0.9 % IV SOLN
Freq: Once | INTRAVENOUS | Status: AC
  Filled 2021-01-13: qty 250

## 2021-01-13 MED ORDER — HEPARIN SOD (PORK) LOCK FLUSH 100 UNIT/ML IV SOLN
500.0000 [IU] | Freq: Once | INTRAVENOUS | Status: AC | PRN
  Administered 2021-01-13: 500 [IU]
  Filled 2021-01-13: qty 5

## 2021-01-13 MED ORDER — SODIUM CHLORIDE 0.9 % IV SOLN
200.0000 mg | Freq: Once | INTRAVENOUS | Status: AC
  Administered 2021-01-13: 200 mg via INTRAVENOUS
  Filled 2021-01-13: qty 8

## 2021-01-13 MED ORDER — SODIUM CHLORIDE 0.9% FLUSH
10.0000 mL | INTRAVENOUS | Status: DC | PRN
  Administered 2021-01-13: 10 mL
  Filled 2021-01-13: qty 10

## 2021-01-13 MED ORDER — SODIUM CHLORIDE 0.9% FLUSH
10.0000 mL | Freq: Once | INTRAVENOUS | Status: AC
  Administered 2021-01-13: 10 mL
  Filled 2021-01-13: qty 10

## 2021-01-13 NOTE — Patient Instructions (Signed)
[facility] Discharge Instructions for Patients Receiving Chemotherapy  Today you received the following chemotherapy agents keytruda   To help prevent nausea and vomiting after your treatment, we encourage you to take your nausea medication as directed  If you develop nausea and vomiting that is not controlled by your nausea medication, call the clinic.   BELOW ARE SYMPTOMS THAT SHOULD BE REPORTED IMMEDIATELY:  *FEVER GREATER THAN 100.5 F  *CHILLS WITH OR WITHOUT FEVER  NAUSEA AND VOMITING THAT IS NOT CONTROLLED WITH YOUR NAUSEA MEDICATION  *UNUSUAL SHORTNESS OF BREATH  *UNUSUAL BRUISING OR BLEEDING  TENDERNESS IN MOUTH AND THROAT WITH OR WITHOUT PRESENCE OF ULCERS  *URINARY PROBLEMS  *BOWEL PROBLEMS  UNUSUAL RASH Items with * indicate a potential emergency and should be followed up as soon as possible.  Feel free to call the clinic you have any questions or concerns. The clinic phone number is [phone].  

## 2021-01-18 ENCOUNTER — Ambulatory Visit
Admission: RE | Admit: 2021-01-18 | Discharge: 2021-01-18 | Disposition: A | Discharge status: Home / Self Care | Payer: 59 | Source: Ambulatory Visit | Attending: Radiation Oncology | Admitting: Radiation Oncology

## 2021-01-18 ENCOUNTER — Other Ambulatory Visit: Payer: Self-pay

## 2021-01-18 ENCOUNTER — Encounter: Payer: Self-pay | Admitting: Licensed Clinical Social Worker

## 2021-01-18 ENCOUNTER — Encounter: Payer: Self-pay | Admitting: Radiation Oncology

## 2021-01-18 VITALS — Ht 62.0 in | Wt 166.0 lb

## 2021-01-18 DIAGNOSIS — C50211 Malignant neoplasm of upper-inner quadrant of right female breast: Secondary | ICD-10-CM

## 2021-01-18 DIAGNOSIS — C50411 Malignant neoplasm of upper-outer quadrant of right female breast: Secondary | ICD-10-CM

## 2021-01-18 DIAGNOSIS — Z171 Estrogen receptor negative status [ER-]: Secondary | ICD-10-CM

## 2021-01-18 NOTE — Progress Notes (Signed)
[place] Psychosocial Distress Screening Clinical Social Work  Clinical Social Work was referred by distress screening protocol.  The patient scored a 5 on the Psychosocial Distress Thermometer which indicates moderate distress. Clinical Social Worker contacted patient by phone to assess for distress and other psychosocial needs.   Patient reports doing well after visit today and preparing for surgery and next steps. Still has good support in her life. No needs at this time. CSW reminded patient of support available and patient may call as needed.  ON[facility] DISTRESS SCREENING 01/18/2021  Screening Type Initial Screening  Distress experienced in past week (1-10) 5  Emotional problem type Nervousness/Anxiety;Adjusting to illness   Clinical Social Worker follow up needed: No.  If yes, follow up plan:  Dariusz Brase E Alaster Asfaw, LCSW

## 2021-01-18 NOTE — Progress Notes (Signed)
Radiation Oncology         [phone] ________________________________  Name: Penny Brown        MRN: [id number]  Date of Service: 01/18/2021 DOB: 05-15-1971  BO:FBPZW, Curt Bears, NP  [name], MD     REFERRING PHYSICIAN: [name], MD   DIAGNOSIS: The encounter diagnosis was Malignant neoplasm of upper-inner quadrant of right breast in female, estrogen receptor negative ([place]).   HISTORY OF PRESENT ILLNESS: Penny Brown is a 50 y.o. female originallyseen in the multidisciplinary breast clinic for a new diagnosis of rightbreast cancer. The patient was noted to have a history of right breast cancer for which she underwent right lumpectomy, adjuvant radiotherapy and chemotherapy. She recently palpated a mass in the right breast and diagnostic imaging revealed a 2.2 cm mass in the upper medial breast. An ultrasound measured the lesion at 2.2 cm at 1:00 and her axilla was negative for adenopathy. 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   Since her last visit she underwent an MRI of the breast on 10/08/2020 that showed the known enhancing mass in the upper inner quadrant of the right breast measuring up to 2.3 cm no other suspicious mass or abnormal enhancement was identified and no abnormal appearing lymph nodes were noted.  She began systemic chemotherapy on 10/14/2020, she received chemotherapy until 12/17/2020, and a single dose of Keytruda on 01/13/2021.  She did not tolerate systemic therapy due to progressive peripheral neuropathy.  An MRI however on 12/24/2020 showed only minimal's subtle enhancement at the known site of malignancy and this was read as near complete resolution radiographically.  The patient is seen today to discuss next steps of the possibility of reirradiation but she is contemplating surgical options.  PREVIOUS RADIATION THERAPY: Yes   2008:  The right breast was  treated with adjuvant radiation though details are unknown at this time.   PAST MEDICAL HISTORY:  Past Medical History:  Diagnosis Date  . Breast cancer [facility]) 2008   Right Breast Cancer  . Breast cancer ([place]) 09/2020   Right Breast  . Cancer ([place])   . Family history of breast cancer   . Family history of prostate cancer   . Personal history of chemotherapy 2008   Right Breast Cancer  . Personal history of radiation therapy 2008   Right Breast Cancer       PAST SURGICAL HISTORY: Past Surgical History:  Procedure Laterality Date  . BREAST LUMPECTOMY Right 2008  . CESAREAN SECTION    . PORTACATH PLACEMENT Left 10/13/2020   Procedure: INSERTION PORT-A-CATH;  Surgeon: [name], MD;  Location: [facility];  Service: General;  Laterality: Left;  LEAVE PORT ACCESSED CHEMO STARTS ON 2/10, LEFT SIDE     FAMILY HISTORY:  Family History  Problem Relation Age of Onset  . Breast cancer Maternal Grandmother 78  . Prostate cancer Maternal Grandfather        dx 56s, metastatic     SOCIAL HISTORY:  reports that she has never smoked. She has never used smokeless tobacco. She reports that she does not drink alcohol and does not use drugs. The patient is married and lives in [place]. She works for [employer].    ALLERGIES: Patient has no known allergies.   MEDICATIONS:  Current Outpatient Medications  Medication Sig Dispense Refill  . LORazepam (ATIVAN) 0.5 MG tablet Take one tablet before  coming to cancer center for chemo treatment 30 tablet 0  . traMADol (ULTRAM) 50 MG tablet Take 1 tablet (50 mg total) by mouth every 6 (six) hours as needed. 8 tablet 0   No current facility-administered medications for this encounter.     REVIEW OF SYSTEMS: On review of systems, the patient reports that she is doing okay. She has really struggled with peripheral neuropathy since starting chemo. She has been tired. Otherwise she's really trying to decide how she'd  like to proceed surgically.      PHYSICAL EXAM:  Wt Readings from Last 3 Encounters:  01/18/21 166 lb (75.3 kg)  01/13/21 165 lb 6.4 oz (75 kg)  12/17/20 166 lb 8 oz (75.5 kg)    In general this is a well appearing African American female in no acute distress. She's alert and oriented x4 and appropriate throughout the examination. Cardiopulmonary assessment is negative for acute distress and she exhibits normal effort. Bilateral breast exam is deferred.    ECOG = 1  0 - Asymptomatic (Fully active, able to carry on all predisease activities without restriction)  1 - Symptomatic but completely ambulatory (Restricted in physically strenuous activity but ambulatory and able to carry out work of a light or sedentary nature. For example, light housework, office work)  2 - Symptomatic, <50% in bed during the day (Ambulatory and capable of all self care but unable to carry out any work activities. Up and about more than 50% of waking hours)  3 - Symptomatic, >50% in bed, but not bedbound (Capable of only limited self-care, confined to bed or chair 50% or more of waking hours)  4 - Bedbound (Completely disabled. Cannot carry on any self-care. Totally confined to bed or chair)  5 - Death   Penny Brown, Penny Brown, Penny Brown, et al. [phone]). "Toxicity and response criteria of the [facility]". [place] Oncol. 5 (6): 649-55    LABORATORY DATA:  Lab Results  Component Value Date   WBC 4.5 01/13/2021   HGB 9.7 (L) 01/13/2021   HCT 30.5 (L) 01/13/2021   MCV 92.7 01/13/2021   PLT 157 01/13/2021   Lab Results  Component Value Date   NA 139 01/13/2021   K 3.9 01/13/2021   CL 106 01/13/2021   CO2 24 01/13/2021   Lab Results  Component Value Date   ALT 12 01/13/2021   AST 17 01/13/2021   ALKPHOS 89 01/13/2021   BILITOT 0.2 (L) 01/13/2021      RADIOGRAPHY: MR BREAST BILATERAL W WO CONTRAST INC CAD  Result Date: 12/24/2020 CLINICAL DATA:  Post chemotherapy  imaging prior to surgery. LABS:  None EXAM: BILATERAL BREAST MRI WITH AND WITHOUT CONTRAST TECHNIQUE: Multiplanar, multisequence MR images of both breasts were obtained prior to and following the intravenous administration of 8 ml of Gadavist Three-dimensional MR images were rendered by post-processing of the original MR data on an independent workstation. The three-dimensional MR images were interpreted, and findings are reported in the following complete MRI report for this study. Three dimensional images were evaluated at the independent interpreting workstation using the DynaCAD thin client. COMPARISON:  October 08, 2020 breast MRI FINDINGS: Breast composition: b. Scattered fibroglandular tissue. Background parenchymal enhancement: Mild Right breast: The susceptibility artifact from post biopsy clip is again identified. On T1 and T2 images, there is much less soft tissue surrounding the clips consistent with improvement. There is also significant improvement of surrounding enhancement with only a tiny amount of subtle  enhancement superior to the clip. There has been near complete resolution of the previously identified malignancy. Left breast: No mass or abnormal enhancement. Lymph nodes: No abnormal appearing lymph nodes. Ancillary findings:  None. IMPRESSION: 1. Only minimal subtle enhancement remains at the site of the patient's known malignancy, just superior to the clips left at biopsy. The soft tissue surrounding the clips seen on the previous MRI has essentially resolved as well. The findings are consistent with near complete resolution of the previously identified malignancy. 2. No evidence of malignancy in the left breast. RECOMMENDATION: Recommend continued surgical and oncologic follow up. BI-RADS CATEGORY  6: Known biopsy-proven malignancy. Electronically Signed   By: Penny Brown   On: 12/24/2020 12:38       IMPRESSION/PLAN: 1. 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 Penny Brown discusses the original pathology findings and reviews the nature of triple negative breast disease. We reviewed our prior conversations with the patient and she has also discussed this with both Dr. Jana Hakim and Dr. Donne Hazel.  Penny Brown discusses that while there are scenarios for which reirradiation has been found to be effective and safe to administer, there are risks long term that could influence cosmetic outcomes, and that she still has a high risk cancer that could locally recur. Penny Brown spent time discussing these options including  the risks of re-irradiation, the delivery and logistics of treatment, and if we proceed would likely need to give treatment over 5-5 1/2 weeks rather than 6 1/2 to eliminate the boost component of treatment. The patient is in will consider her options before making a decision. She is leaning more toward mastectomy but is also interested in understanding all of her reconstruction options as she would likely desire reconstruction even if it can't be done as quickly as mastectomy. We will follow up with her next week to see how she would like to proceed, but she is also aware that as soon as she makes a decision, she should contact Dr. Cristal Generous office.  2. Chemotherapy induced peripheral neuropathy. Hopefully this will improve with time and avoiding further chemotherapy. We will follow this expectantly   This encounter was provided by telemedicine platform MyChart.  The patient has provided two factor identification and has given verbal consent for this type of encounter and has been advised to only accept a meeting of this type in a secure network environment. The time spent during this encounter was 75 minutes including preparation, discussion, and coordination of the patient's care. The attendants for  this meeting include Penny Nicely, RN, Penny Brown, Penny Brown  and Penny Brown and her husband Penny Brown.  During the encounter,  Penny Nicely, RN, Penny Brown, and Penny Brown were located at [facility] Radiation Oncology Department.  Penny Brown was located at home with her husband Penny Brown.   The above documentation reflects my direct findings during this shared patient visit. Please see the separate note by Penny Brown on this date for the remainder of the patient's plan of care.    Penny Brown, Penny Brown

## 2021-01-18 NOTE — Progress Notes (Signed)
New Breast Cancer Diagnosis:upper-inner quadrant of right breast   Did patient present with symptoms (if so, please note symptoms) or screening mammography?:Palpable mass    She has a history of right breast cancer for which she underwent right lumpectomy, adjuvant radiotherapy and chemotherapy  Location and Extent of disease :right breast. Located at 1 o'clock position, measured  2.2 cm in greatest dimension. Adenopathy no.  Histology per Pathology Report: grade 3, Invasive Ductal Carcinoma  Receptor Status: ER(negative), PR (negative), Her2-neu (negative), Ki-(80%)  Surgeon and surgical plan, if any: Dr. Donne Hazel 01/14/2021 -Plan for Mastectomy vs lumpectomy with SLN biopsy- unscheduled at this time. -Right Lumpectomy with SLN biopsy 2008   Medical oncologist, treatment if any:  Dr. Jana Hakim 01/13/2021 -Her case was represented at the multidisciplinary breast cancer conference 01/05/2021.  At that point surgery suggested mastectomy and attempted repeat sentinel lymph node sampling.  No adjuvant radiation -She continues on pembrolizumab every 21 days.  She had her most recent dose 12/17/2020 and has a dose due today. -She will see me again on 02/24/2021 with her Keytruda dose that day.  By then we should have her definitive pathology results, which I anticipate will be favorable   Family History of Breast/Ovarian/Prostate Cancer: MGM had breast, MGF had prostate  Lymphedema issues, if any: No   Pain issues, if any: No  SAFETY ISSUES: Prior radiation? Right Breast 2008, 6 weeks, does not remember doctor Pacemaker/ICD? no Possible current pregnancy? No, Having cycles Is the patient on methotrexate? No  Current Complaints / other details:   Port Inserted 10/13/2020

## 2021-01-18 NOTE — Progress Notes (Signed)
New Breast Cancer Diagnosis:upper-inner quadrant of right breast   Did patient present with symptoms (if so, please note symptoms) or screening mammography?:Palpable mass    She has a history of right breast cancer for which she underwent right lumpectomy, adjuvant radiotherapy and chemotherapy  Location and Extent of disease :right breast. Located at 1 o'clock position, measured  2.2 cm in greatest dimension. Adenopathy no.  Histology per Pathology Report: grade 3, Invasive Ductal Carcinoma  Receptor Status: ER(negative), PR (negative), Her2-neu (negative), Ki-(80%)  Surgeon and surgical plan, if any: Dr. Wakefield 01/14/2021 -Plan for Mastectomy vs lumpectomy with SLN biopsy- unscheduled at this time. -Right Lumpectomy with SLN biopsy 2008   Medical oncologist, treatment if any:  Dr. Magrinat 01/13/2021 -Her case was represented at the multidisciplinary breast cancer conference 01/05/2021.  At that point surgery suggested mastectomy and attempted repeat sentinel lymph node sampling.  No adjuvant radiation -She continues on pembrolizumab every 21 days.  She had her most recent dose 12/17/2020 and has a dose due today. -She will see me again on 02/24/2021 with her Keytruda dose that day.  By then we should have her definitive pathology results, which I anticipate will be favorable   Family History of Breast/Ovarian/Prostate Cancer: MGM had breast, MGF had prostate  Lymphedema issues, if any: No   Pain issues, if any: No  SAFETY ISSUES: Prior radiation? Right Breast 2008, 6 weeks, does not remember doctor Pacemaker/ICD? no Possible current pregnancy? No, Having cycles Is the patient on methotrexate? No  Current Complaints / other details:   Port Inserted 10/13/2020  

## 2021-01-19 ENCOUNTER — Encounter: Payer: Self-pay | Admitting: *Deleted

## 2021-01-24 ENCOUNTER — Telehealth: Payer: Self-pay | Admitting: Radiation Oncology

## 2021-01-24 NOTE — Telephone Encounter (Addendum)
I spoke with the patient and she's planning on mastectomy and is awaiting further discussion about reconstruction.

## 2021-01-27 ENCOUNTER — Encounter: Payer: Self-pay | Admitting: *Deleted

## 2021-02-02 ENCOUNTER — Encounter: Payer: Self-pay | Admitting: *Deleted

## 2021-02-02 ENCOUNTER — Other Ambulatory Visit: Payer: Self-pay | Admitting: General Surgery

## 2021-02-02 DIAGNOSIS — Z171 Estrogen receptor negative status [ER-]: Secondary | ICD-10-CM

## 2021-02-02 DIAGNOSIS — C50411 Malignant neoplasm of upper-outer quadrant of right female breast: Secondary | ICD-10-CM

## 2021-02-03 ENCOUNTER — Inpatient Hospital Stay: Payer: 59

## 2021-02-03 ENCOUNTER — Encounter: Payer: Self-pay | Admitting: Nurse Practitioner

## 2021-02-03 ENCOUNTER — Inpatient Hospital Stay (HOSPITAL_BASED_OUTPATIENT_CLINIC_OR_DEPARTMENT_OTHER): Payer: 59 | Admitting: Nurse Practitioner

## 2021-02-03 ENCOUNTER — Other Ambulatory Visit: Payer: 59

## 2021-02-03 ENCOUNTER — Other Ambulatory Visit: Payer: Self-pay

## 2021-02-03 ENCOUNTER — Ambulatory Visit: Payer: 59

## 2021-02-03 ENCOUNTER — Inpatient Hospital Stay: Payer: 59 | Attending: Nurse Practitioner

## 2021-02-03 ENCOUNTER — Other Ambulatory Visit: Payer: Self-pay | Admitting: Nurse Practitioner

## 2021-02-03 ENCOUNTER — Ambulatory Visit: Payer: 59 | Admitting: Adult Health

## 2021-02-03 VITALS — BP 134/89 | HR 98 | Temp 97.9°F | Resp 16 | Ht 62.0 in | Wt 166.2 lb

## 2021-02-03 DIAGNOSIS — Z95828 Presence of other vascular implants and grafts: Secondary | ICD-10-CM | POA: Diagnosis not present

## 2021-02-03 DIAGNOSIS — Z171 Estrogen receptor negative status [ER-]: Secondary | ICD-10-CM | POA: Diagnosis not present

## 2021-02-03 DIAGNOSIS — C50211 Malignant neoplasm of upper-inner quadrant of right female breast: Secondary | ICD-10-CM | POA: Diagnosis present

## 2021-02-03 DIAGNOSIS — G629 Polyneuropathy, unspecified: Secondary | ICD-10-CM | POA: Diagnosis not present

## 2021-02-03 DIAGNOSIS — Z9221 Personal history of antineoplastic chemotherapy: Secondary | ICD-10-CM | POA: Diagnosis not present

## 2021-02-03 DIAGNOSIS — Z8042 Family history of malignant neoplasm of prostate: Secondary | ICD-10-CM | POA: Insufficient documentation

## 2021-02-03 DIAGNOSIS — C50411 Malignant neoplasm of upper-outer quadrant of right female breast: Secondary | ICD-10-CM

## 2021-02-03 DIAGNOSIS — R5383 Other fatigue: Secondary | ICD-10-CM | POA: Insufficient documentation

## 2021-02-03 DIAGNOSIS — Z5112 Encounter for antineoplastic immunotherapy: Secondary | ICD-10-CM | POA: Insufficient documentation

## 2021-02-03 DIAGNOSIS — Z803 Family history of malignant neoplasm of breast: Secondary | ICD-10-CM | POA: Insufficient documentation

## 2021-02-03 DIAGNOSIS — Z9011 Acquired absence of right breast and nipple: Secondary | ICD-10-CM | POA: Insufficient documentation

## 2021-02-03 DIAGNOSIS — Z923 Personal history of irradiation: Secondary | ICD-10-CM | POA: Diagnosis not present

## 2021-02-03 LAB — COMPREHENSIVE METABOLIC PANEL
ALT: 10 U/L (ref 0–44)
AST: 17 U/L (ref 15–41)
Albumin: 3.8 g/dL (ref 3.5–5.0)
Alkaline Phosphatase: 102 U/L (ref 38–126)
Anion gap: 10 (ref 5–15)
BUN: 13 mg/dL (ref 6–20)
CO2: 22 mmol/L (ref 22–32)
Calcium: 9.3 mg/dL (ref 8.9–10.3)
Chloride: 106 mmol/L (ref 98–111)
Creatinine, Ser: 0.77 mg/dL (ref 0.44–1.00)
GFR, Estimated: >60 mL/min (ref >60)
Glucose, Bld: 75 mg/dL (ref 70–99)
Potassium: 4 mmol/L (ref 3.5–5.1)
Sodium: 138 mmol/L (ref 135–145)
Total Bilirubin: <0.2 mg/dL — ABNORMAL LOW (ref 0.3–1.2)
Total Protein: 7.6 g/dL (ref 6.5–8.1)

## 2021-02-03 LAB — CBC WITH DIFFERENTIAL/PLATELET
Abs Immature Granulocytes: 0.02 10*3/uL (ref 0.00–0.07)
Basophils Absolute: 0 10*3/uL (ref 0.0–0.1)
Basophils Relative: 0 %
Eosinophils Absolute: 0.1 10*3/uL (ref 0.0–0.5)
Eosinophils Relative: 2 %
HCT: 31.6 % — ABNORMAL LOW (ref 36.0–46.0)
Hemoglobin: 10 g/dL — ABNORMAL LOW (ref 12.0–15.0)
Immature Granulocytes: 0 %
Lymphocytes Relative: 42 %
Lymphs Abs: 2.3 10*3/uL (ref 0.7–4.0)
MCH: 29.6 pg (ref 26.0–34.0)
MCHC: 31.6 g/dL (ref 30.0–36.0)
MCV: 93.5 fL (ref 80.0–100.0)
Monocytes Absolute: 0.5 10*3/uL (ref 0.1–1.0)
Monocytes Relative: 8 %
Neutro Abs: 2.7 10*3/uL (ref 1.7–7.7)
Neutrophils Relative %: 48 %
Platelets: 238 10*3/uL (ref 150–400)
RBC: 3.38 MIL/uL — ABNORMAL LOW (ref 3.87–5.11)
RDW: 15.2 % (ref 11.5–15.5)
WBC: 5.6 10*3/uL (ref 4.0–10.5)
nRBC: 0 % (ref 0.0–0.2)

## 2021-02-03 LAB — TSH: TSH: 2.336 u[IU]/mL (ref 0.308–3.960)

## 2021-02-03 MED ORDER — HEPARIN SOD (PORK) LOCK FLUSH 100 UNIT/ML IV SOLN
500.0000 [IU] | Freq: Once | INTRAVENOUS | Status: AC | PRN
  Administered 2021-02-03: 500 [IU]
  Filled 2021-02-03: qty 5

## 2021-02-03 MED ORDER — SODIUM CHLORIDE 0.9 % IV SOLN
Freq: Once | INTRAVENOUS | Status: AC
  Filled 2021-02-03: qty 250

## 2021-02-03 MED ORDER — SODIUM CHLORIDE 0.9% FLUSH
10.0000 mL | Freq: Once | INTRAVENOUS | Status: AC
  Administered 2021-02-03: 10 mL
  Filled 2021-02-03: qty 10

## 2021-02-03 MED ORDER — SODIUM CHLORIDE 0.9% FLUSH
10.0000 mL | INTRAVENOUS | Status: DC | PRN
  Administered 2021-02-03: 10 mL
  Filled 2021-02-03: qty 10

## 2021-02-03 MED ORDER — SODIUM CHLORIDE 0.9 % IV SOLN
200.0000 mg | Freq: Once | INTRAVENOUS | Status: AC
  Administered 2021-02-03: 200 mg via INTRAVENOUS
  Filled 2021-02-03: qty 8

## 2021-02-03 NOTE — Patient Instructions (Signed)
[facility] Discharge Instructions for Patients Receiving Chemotherapy  Today you received the following chemotherapy agents keytruda   To help prevent nausea and vomiting after your treatment, we encourage you to take your nausea medication as directed  If you develop nausea and vomiting that is not controlled by your nausea medication, call the clinic.   BELOW ARE SYMPTOMS THAT SHOULD BE REPORTED IMMEDIATELY:  *FEVER GREATER THAN 100.5 F  *CHILLS WITH OR WITHOUT FEVER  NAUSEA AND VOMITING THAT IS NOT CONTROLLED WITH YOUR NAUSEA MEDICATION  *UNUSUAL SHORTNESS OF BREATH  *UNUSUAL BRUISING OR BLEEDING  TENDERNESS IN MOUTH AND THROAT WITH OR WITHOUT PRESENCE OF ULCERS  *URINARY PROBLEMS  *BOWEL PROBLEMS  UNUSUAL RASH Items with * indicate a potential emergency and should be followed up as soon as possible.  Feel free to call the clinic you have any questions or concerns. The clinic phone number is [phone].  

## 2021-02-03 NOTE — Progress Notes (Signed)
Per Regan Rakers, NP, ok to proceed with [facility] with pending labs.

## 2021-02-03 NOTE — Progress Notes (Signed)
[place]   [phone] [phone]   Clinic Follow up Note   Patient Care Team: [name], NP as PCP - General (Family Medicine) [name], RN as Oncology Nurse Navigator [name], RN as Oncology Nurse Navigator Magrinat, [name], MD as Consulting Physician (Oncology) [name], MD as Consulting Physician (General Surgery) [name], MD as Consulting Physician (Radiation Oncology) 02/03/2021  CHIEF COMPLAINT: Follow up triple negative breast cancer   SUMMARY OF ONCOLOGIC HISTORY: Oncology History  Malignant neoplasm of upper-inner quadrant of right breast in female, estrogen receptor negative ([place])  09/27/2020 Initial Diagnosis   Malignant neoplasm of upper-inner quadrant of right breast in female, estrogen receptor negative ([place])   09/29/2020 Cancer Staging   Staging form: Breast, AJCC 8th Edition - Clinical stage from 09/29/2020: Stage IIB (rcT2, cN0, cM0, G3, ER-, PR-, HER2-) - Signed by [name], MD on 09/29/2020   10/14/2020 - 12/17/2020 Chemotherapy      Patient is on Antibody Plan: BREAST PEMBROLIZUMAB Q21D    10/19/2020 Genetic Testing   Negative genetic testing:  No pathogenic variants detected on the [employer] Common Hereditary Cancers panel. The report date is 10/19/2020.   The Common Hereditary Cancers Panel offered by [facility] includes sequencing and/or deletion duplication testing of the following 48 genes: APC, ATM, AXIN2, BARD1, BMPR1A, BRCA1, BRCA2, BRIP1, CDH1, CDK4, CDKN2A (p14ARF), CDKN2A (p16INK4a), CHEK2, CTNNA1, DICER1, EPCAM (Deletion/duplication testing only), GREM1 (promoter region deletion/duplication testing only), KIT, MEN1, MLH1, MSH2, MSH3, MSH6, MUTYH, NBN, NF1, NTHL1, PALB2, PDGFRA, PMS2, POLD1, POLE, PTEN, RAD50, RAD51C, RAD51D, RNF43, SDHB, SDHC, SDHD, SMAD4, SMARCA4. STK11, TP53, TSC1, TSC2, and VHL.  The following genes were evaluated for sequence changes only: SDHA and HOXB13  c.251G>A variant only.   11/04/2020 -  Chemotherapy      Patient is on Antibody Plan: BREAST PEMBROLIZUMAB Q21D    Malignant neoplasm of upper-outer quadrant of right breast in female, estrogen receptor negative ([place])  09/29/2020 Initial Diagnosis   Malignant neoplasm of upper-outer quadrant of right breast in female, estrogen receptor negative ([place])   09/29/2020 Cancer Staging   Staging form: Breast, AJCC 8th Edition - Clinical: cT2, cN1, cM0, ER-, PR-, HER2- - Signed by [name], MD on 09/29/2020   10/19/2020 Genetic Testing   Negative genetic testing:  No pathogenic variants detected on the [employer] Common Hereditary Cancers panel. The report date is 10/19/2020.   The Common Hereditary Cancers Panel offered by [facility] includes sequencing and/or deletion duplication testing of the following 48 genes: APC, ATM, AXIN2, BARD1, BMPR1A, BRCA1, BRCA2, BRIP1, CDH1, CDK4, CDKN2A (p14ARF), CDKN2A (p16INK4a), CHEK2, CTNNA1, DICER1, EPCAM (Deletion/duplication testing only), GREM1 (promoter region deletion/duplication testing only), KIT, MEN1, MLH1, MSH2, MSH3, MSH6, MUTYH, NBN, NF1, NTHL1, PALB2, PDGFRA, PMS2, POLD1, POLE, PTEN, RAD50, RAD51C, RAD51D, RNF43, SDHB, SDHC, SDHD, SMAD4, SMARCA4. STK11, TP53, TSC1, TSC2, and VHL.  The following genes were evaluated for sequence changes only: SDHA and HOXB13 c.251G>A variant only.     CURRENT THERAPY: Completed neoadjuvant chemo, continues pembrolizumab q3 weeks to complete 1 year; R mastectomy 02/24/21   INTERVAL HISTORY: Ms. Asleson returns for follow up and treatment as scheduled. She was last seen 01/13/21 and received another cycle of pembro. She is scheduled for R mastectomy on 02/24/21. She has decided to do breast reconstruction later on.  She is doing well with mild fatigue.  She continues working.  Appetite is adequate.  Neuropathy is improving, now intermittent.  Denies concerns in her breast,  fever, chills, cough, chest pain, dyspnea, n/v/c/d,  or other concerns.  MEDICAL HISTORY:  Past Medical History:  Diagnosis Date  . Breast cancer [facility]) 2008   Right Breast Cancer  . Breast cancer ([place]) 09/2020   Right Breast  . Cancer ([place])   . Family history of breast cancer   . Family history of prostate cancer   . Personal history of chemotherapy 2008   Right Breast Cancer  . Personal history of radiation therapy 2008   Right Breast Cancer    SURGICAL HISTORY: Past Surgical History:  Procedure Laterality Date  . BREAST LUMPECTOMY Right 2008  . CESAREAN SECTION    . PORTACATH PLACEMENT Left 10/13/2020   Procedure: INSERTION PORT-A-CATH;  Surgeon: [name], MD;  Location: [place];  Service: General;  Laterality: Left;  LEAVE PORT ACCESSED CHEMO STARTS ON 2/10, LEFT SIDE    I have reviewed the social history and family history with the patient and they are unchanged from previous note.  ALLERGIES:  has No Known Allergies.  MEDICATIONS:  Current Outpatient Medications  Medication Sig Dispense Refill  . LORazepam (ATIVAN) 0.5 MG tablet Take one tablet before coming to cancer center for chemo treatment 30 tablet 0  . traMADol (ULTRAM) 50 MG tablet Take 1 tablet (50 mg total) by mouth every 6 (six) hours as needed. 8 tablet 0   No current facility-administered medications for this visit.   Facility-Administered Medications Ordered in Other Visits  Medication Dose Route Frequency Provider Last Rate Last Admin  . heparin lock flush 100 unit/mL  500 Units Intracatheter Once PRN Magrinat, [name], MD      . pembrolizumab [facility]) 200 mg in sodium chloride 0.9 % 50 mL chemo infusion  200 mg Intravenous Once Magrinat, [name], MD      . sodium chloride flush (NS) 0.9 % injection 10 mL  10 mL Intracatheter PRN Magrinat, [name], MD        PHYSICAL EXAMINATION: ECOG PERFORMANCE STATUS: 1 - Symptomatic but completely ambulatory  Vitals:   02/03/21 1402  BP: 134/89  Pulse: 98  Resp: 16  Temp: 97.9  F (36.6 C)  SpO2: 100%   Filed Weights   02/03/21 1402  Weight: 166 lb 3.2 oz (75.4 kg)    GENERAL:alert, no distress and comfortable SKIN: No rash EYES: sclera clear LYMPH:  no palpable cervical or supraclavicular lymphadenopathy LUNGS: clear with normal breathing effort HEART: regular rate & rhythm, no lower extremity edema ABDOMEN:abdomen soft, non-tender and normal bowel sounds NEURO: alert & oriented x 3 with fluent speech, no focal motor/sensory deficits PAC without erythema Breast exam deferred  LABORATORY DATA:  I have reviewed the data as listed CBC Latest Ref Rng & Units 02/03/2021 01/13/2021 12/17/2020  WBC 4.0 - 10.5 K/uL 5.6 4.5 4.2  Hemoglobin 12.0 - 15.0 g/dL 10.0(L) 9.7(L) 8.7(L)  Hematocrit 36.0 - 46.0 % 31.6(L) 30.5(L) 27.0(L)  Platelets 150 - 400 K/uL 238 157 175     CMP Latest Ref Rng & Units 01/13/2021 12/17/2020 12/09/2020  Glucose 70 - 99 mg/dL 99 96 105(H)  BUN 6 - 20 mg/dL 13 10 10   Creatinine 0.44 - 1.00 mg/dL 0.82 0.77 0.74  Sodium 135 - 145 mmol/L 139 139 139  Potassium 3.5 - 5.1 mmol/L 3.9 4.9 4.2  Chloride 98 - 111 mmol/L 106 106 108  CO2 22 - 32 mmol/L 24 23 22   Calcium 8.9 - 10.3 mg/dL 9.3 9.0 8.7(L)  Total Protein 6.5 - 8.1  g/dL 7.2 7.1 6.9  Total Bilirubin 0.3 - 1.2 mg/dL 0.2(L) <0.2(L) <0.2(L)  Alkaline Phos 38 - 126 U/L 89 85 83  AST 15 - 41 U/L 17 20 18   ALT 0 - 44 U/L 12 18 20       RADIOGRAPHIC STUDIES: I have personally reviewed the radiological images as listed and agreed with the findings in the report. No results found.   ASSESSMENT & PLAN: 50 year old BRCA negative female  1.  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  New vs recurrent invasive ductal carcinoma upper inner right breast, grade 3, triple negative, cT2N0 stage IIB, MIB-1 of 80% -Diagnosed 09/22/2020 -Staging work-up shows  indeterminate pulmonary nodules, negative bone scan -Genetic testing 10/19/2020 shows no deleterious mutations -Completed neoadjuvant chemotherapy weekly Taxol/carbo 8 of 12 cycles from 10/14/20 to 12/17/2020, final 4 cycles of chemo held for neuropathy -For triple negative breast cancer, she began pembrolizumab q3 weeks 10/2020, plan to complete 1 year -Breast MRI 12/24/2020 shows near complete resolution of malignancy -Definitive right mastectomy scheduled on 02/24/2021  Disposition: Ms. Dahan appears stable.  She continues q3 week pembrolizumab, tolerating well without significant toxicities except mild fatigue.  Neuropathy from neoadjuvant taxol/carbo is improving.  She is able to recover and function well.   CBC reviewed.  CMP and TSH are pending.  She will proceed with another cycle of pembrolizumab today as planned.  She is scheduled for right mastectomy on 02/24/2021 with Dr. Donne Hazel.  She will plan for breast reconstruction at a later date.   Follow-up and continue Pembro after surgery.    Orders Placed This Encounter  Procedures  . SCHEDULING COMMUNICATION    Schedule CVC Flush Appointment   All questions were answered. The patient knows to call the clinic with any problems, questions or concerns. No barriers to learning were detected.     Alla Feeling, NP 02/03/21

## 2021-02-03 NOTE — Patient Instructions (Signed)
Implanted Port Insertion, Care After This sheet gives you information about how to care for yourself after your procedure. Your health care provider may also give you more specific instructions. If you have problems or questions, contact your health care provider. What can I expect after the procedure? After the procedure, it is common to have:  Discomfort at the port insertion site.  Bruising on the skin over the port. This should improve over 3-4 days. Follow these instructions at home: Port care  After your port is placed, you will get a manufacturer's information card. The card has information about your port. Keep this card with you at all times.  Take care of the port as told by your health care provider. Ask your health care provider if you or a family member can get training for taking care of the port at home. A home health care nurse may also take care of the port.  Make sure to remember what type of port you have. Incision care  Follow instructions from your health care provider about how to take care of your port insertion site. Make sure you: ? Wash your hands with soap and water before and after you change your bandage (dressing). If soap and water are not available, use hand sanitizer. ? Change your dressing as told by your health care provider. ? Leave stitches (sutures), skin glue, or adhesive strips in place. These skin closures may need to stay in place for 2 weeks or longer. If adhesive strip edges start to loosen and curl up, you may trim the loose edges. Do not remove adhesive strips completely unless your health care provider tells you to do that.  Check your port insertion site every day for signs of infection. Check for: ? Redness, swelling, or pain. ? Fluid or blood. ? Warmth. ? Pus or a bad smell.      Activity  Return to your normal activities as told by your health care provider. Ask your health care provider what activities are safe for you.  Do not  lift anything that is heavier than 10 lb (4.5 kg), or the limit that you are told, until your health care provider says that it is safe. General instructions  Take over-the-counter and prescription medicines only as told by your health care provider.  Do not take baths, swim, or use a hot tub until your health care provider approves. Ask your health care provider if you may take showers. You may only be allowed to take sponge baths.  Do not drive for 24 hours if you were given a sedative during your procedure.  Wear a medical alert bracelet in case of an emergency. This will tell any health care providers that you have a port.  Keep all follow-up visits as told by your health care provider. This is important. Contact a health care provider if:  You cannot flush your port with saline as directed, or you cannot draw blood from the port.  You have a fever or chills.  You have redness, swelling, or pain around your port insertion site.  You have fluid or blood coming from your port insertion site.  Your port insertion site feels warm to the touch.  You have pus or a bad smell coming from the port insertion site. Get help right away if:  You have chest pain or shortness of breath.  You have bleeding from your port that you cannot control. Summary  Take care of the port as told by your   health care provider. Keep the manufacturer's information card with you at all times.  Change your dressing as told by your health care provider.  Contact a health care provider if you have a fever or chills or if you have redness, swelling, or pain around your port insertion site.  Keep all follow-up visits as told by your health care provider. This information is not intended to replace advice given to you by your health care provider. Make sure you discuss any questions you have with your health care provider. Document Revised: 03/19/2018 Document Reviewed: 03/19/2018 Elsevier Patient Education   2021 Elsevier Inc.  

## 2021-02-17 NOTE — Pre-Procedure Instructions (Signed)
Surgical Instructions    Your procedure is scheduled on Thursday, June 23rd.  Report to [facility] Main Entrance "A" at 07:00 A.M., then check in with the Admitting office.  Call this number if you have problems the morning of surgery:  [phone]   If you have any questions prior to your surgery date call [phone]: Open Monday-Friday 8am-4pm.    Remember:  Do not eat after midnight the night before your surgery.  You may drink clear liquids until 06:00 AM the morning of your surgery.   Clear liquids allowed are: Water, Non-Citrus Juices (without pulp), Carbonated Beverages, Clear Tea, Black Coffee Only, and Gatorade.  The day of surgery (if you do NOT have diabetes):  Drink ONE (1) Pre-Surgery Clear Ensure by 06:00 AM the morning of [email or web address].y   This drink was given to you during your hospital pre-op appointment visit. Nothing else to drink after completing the Pre-Surgery Clear Ensure.         If you have questions, please contact your surgeon's office.     Take these medicines the morning of surgery with A SIP OF WATER: traMADol (ULTRAM)- If needed LORazepam (ATIVAN)- if needed   As of today, STOP taking any Aspirin (unless otherwise instructed by your surgeon) Aleve, Naproxen, Ibuprofen, Motrin, Advil, Goody's, BC's, all herbal medications, fish oil, and all vitamins.            Do not wear jewelry or makeup. Do not wear lotions, powders, perfumes, or deodorant. Do not shave 48 hours prior to surgery.   Do not bring valuables to the hospital. DO Not wear nail polish, gel polish, artificial nails, or any other type of covering on natural nails including finger and toenails. If patients have artificial nails, gel coating, etc. that need to be removed by a nail salon please have this removed prior to surgery or surgery may need to be canceled/delayed if the surgeon/ anesthesia feels like the patient is unable to be adequately monitored.             [facility] is not  responsible for any belongings or valuables.  Do NOT Smoke (Tobacco/Vaping) or drink Alcohol 24 hours prior to your procedure. If you use a CPAP at night, you may bring all equipment for your overnight stay.   Contacts, glasses, dentures or bridgework may not be worn into surgery, please bring cases for these belongings   For patients admitted to the hospital, discharge time will be determined by your treatment team.   Patients discharged the day of surgery will not be allowed to drive home, and someone needs to stay with them for 24 hours.  ONLY 1 SUPPORT PERSON MAY BE PRESENT WHILE YOU ARE IN SURGERY. IF YOU ARE TO BE ADMITTED ONCE YOU ARE IN YOUR ROOM YOU WILL BE ALLOWED TWO (2) VISITORS.  Minor children may have two parents present. Special consideration for safety and communication needs will be reviewed on a case by case basis.  Special instructions:    Oral Hygiene is also important to reduce your risk of infection.  Remember - BRUSH YOUR TEETH THE MORNING OF SURGERY WITH YOUR REGULAR TOOTHPASTE   [place]- Preparing For Surgery  Before surgery, you can play an important role. Because skin is not sterile, your skin needs to be as free of germs as possible. You can reduce the number of germs on your skin by washing with CHG (chlorahexidine gluconate) Soap before surgery.  CHG is an antiseptic cleaner which kills  germs and bonds with the skin to continue killing germs even after washing.     Please do not use if you have an allergy to CHG or antibacterial soaps. If your skin becomes reddened/irritated stop using the CHG.  Do not shave (including legs and underarms) for at least 48 hours prior to first CHG shower. It is OK to shave your face.  Please follow these instructions carefully.     Shower the NIGHT BEFORE SURGERY and the MORNING OF SURGERY with CHG Soap.   If you chose to wash your hair, wash your hair first as usual with your normal shampoo. After you shampoo, rinse  your hair and body thoroughly to remove the shampoo.  Then [employer] and genitals (private parts) with your normal soap and rinse thoroughly to remove soap.  After that Use CHG Soap as you would any other liquid soap. You can apply CHG directly to the skin and wash gently with a scrungie or a clean washcloth.   Apply the CHG Soap to your body ONLY FROM THE NECK DOWN.  Do not use on open wounds or open sores. Avoid contact with your eyes, ears, mouth and genitals (private parts). Wash Face and genitals (private parts)  with your normal soap.   Wash thoroughly, paying special attention to the area where your surgery will be performed.  Thoroughly rinse your body with warm water from the neck down.  DO NOT shower/wash with your normal soap after using and rinsing off the CHG Soap.  Pat yourself dry with a CLEAN TOWEL.  Wear CLEAN PAJAMAS to bed the night before surgery  Place CLEAN SHEETS on your bed the night before your surgery  DO NOT SLEEP WITH PETS.   Day of Surgery:  Take a shower with CHG soap. Wear Clean/Comfortable clothing the morning of surgery Do not apply any deodorants/lotions.   Remember to brush your teeth WITH YOUR REGULAR TOOTHPASTE.   Please read over the following fact sheets that you were given.

## 2021-02-18 ENCOUNTER — Encounter (HOSPITAL_COMMUNITY): Payer: Self-pay

## 2021-02-18 ENCOUNTER — Other Ambulatory Visit: Payer: Self-pay

## 2021-02-18 ENCOUNTER — Encounter (HOSPITAL_COMMUNITY)
Admission: RE | Admit: 2021-02-18 | Discharge: 2021-02-18 | Disposition: A | Discharge status: Home / Self Care | Payer: 59 | Source: Ambulatory Visit | Attending: General Surgery | Admitting: General Surgery

## 2021-02-18 NOTE — Progress Notes (Signed)
PCP - Penny Hawks FNP Cardiologist - denies  PPM/ICD - debues Device Orders -  Rep Notified -   Chest x-ray - n/a EKG - n/a Stress Test - 2018,Dr. Lucianne Lei ECHO - no Cardiac Cath -   Sleep Study -no  CPAP -   Fasting Blood Sugar - n/a Checks Blood Sugar _____ times a day  Blood Thinner Instructions:n/a Aspirin Instructions:n/a  ERAS Protcol -yes clear liquid until 6am PRE-SURGERY Ensure or G2- Ensure Pre Surgery  COVID TEST-scheduled for Monday 6/20;pt aware.     Anesthesia review: no  Patient denies shortness of breath, fever, cough and chest pain at PAT appointment   All instructions explained to the patient, with a verbal understanding of the material. Patient agrees to go over the instructions while at home for a better understanding. Patient also instructed to self quarantine after being tested for COVID-19. The opportunity to ask questions was provided.

## 2021-02-21 ENCOUNTER — Other Ambulatory Visit (HOSPITAL_COMMUNITY)
Admission: RE | Admit: 2021-02-21 | Discharge: 2021-02-21 | Disposition: A | Discharge status: Home / Self Care | Payer: 59 | Source: Ambulatory Visit | Attending: General Surgery | Admitting: General Surgery

## 2021-02-21 DIAGNOSIS — Z20822 Contact with and (suspected) exposure to covid-19: Secondary | ICD-10-CM | POA: Insufficient documentation

## 2021-02-21 DIAGNOSIS — Z01812 Encounter for preprocedural laboratory examination: Secondary | ICD-10-CM | POA: Diagnosis present

## 2021-02-21 LAB — SARS CORONAVIRUS 2 (TAT 6-24 HRS): SARS Coronavirus 2: NEGATIVE

## 2021-02-24 ENCOUNTER — Ambulatory Visit (HOSPITAL_COMMUNITY): Payer: 59 | Admitting: Anesthesiology

## 2021-02-24 ENCOUNTER — Encounter (HOSPITAL_COMMUNITY)
Admission: RE | Disposition: A | Discharge status: Home / Self Care | Payer: Self-pay | Source: Home / Self Care | Attending: General Surgery

## 2021-02-24 ENCOUNTER — Ambulatory Visit (HOSPITAL_COMMUNITY): Payer: 59

## 2021-02-24 ENCOUNTER — Observation Stay (HOSPITAL_COMMUNITY)
Admission: RE | Admit: 2021-02-24 | Discharge: 2021-02-25 | Disposition: A | Discharge status: Home / Self Care | Payer: 59 | Attending: General Surgery | Admitting: General Surgery

## 2021-02-24 ENCOUNTER — Other Ambulatory Visit: Payer: 59

## 2021-02-24 ENCOUNTER — Ambulatory Visit: Payer: 59

## 2021-02-24 ENCOUNTER — Ambulatory Visit: Payer: 59 | Admitting: Oncology

## 2021-02-24 ENCOUNTER — Ambulatory Visit (HOSPITAL_COMMUNITY)
Admission: RE | Admit: 2021-02-24 | Discharge: 2021-02-24 | Disposition: A | Discharge status: Home / Self Care | Payer: 59 | Source: Ambulatory Visit | Attending: General Surgery | Admitting: General Surgery

## 2021-02-24 DIAGNOSIS — C50211 Malignant neoplasm of upper-inner quadrant of right female breast: Principal | ICD-10-CM | POA: Insufficient documentation

## 2021-02-24 DIAGNOSIS — Z803 Family history of malignant neoplasm of breast: Secondary | ICD-10-CM | POA: Diagnosis not present

## 2021-02-24 DIAGNOSIS — Z9011 Acquired absence of right breast and nipple: Secondary | ICD-10-CM

## 2021-02-24 DIAGNOSIS — Z419 Encounter for procedure for purposes other than remedying health state, unspecified: Secondary | ICD-10-CM

## 2021-02-24 DIAGNOSIS — Z171 Estrogen receptor negative status [ER-]: Secondary | ICD-10-CM

## 2021-02-24 DIAGNOSIS — C50411 Malignant neoplasm of upper-outer quadrant of right female breast: Secondary | ICD-10-CM

## 2021-02-24 HISTORY — PX: MASTECTOMY W/ SENTINEL NODE BIOPSY: SHX2001

## 2021-02-24 LAB — POCT PREGNANCY, URINE: Preg Test, Ur: NEGATIVE

## 2021-02-24 SURGERY — MASTECTOMY WITH SENTINEL LYMPH NODE BIOPSY
Anesthesia: Regional | Site: Breast | Laterality: Right

## 2021-02-24 MED ORDER — FENTANYL CITRATE (PF) 100 MCG/2ML IJ SOLN
INTRAMUSCULAR | Status: AC
  Filled 2021-02-24: qty 2

## 2021-02-24 MED ORDER — OXYCODONE HCL 5 MG PO TABS
5.0000 mg | ORAL_TABLET | ORAL | Status: DC | PRN
  Administered 2021-02-25 (×2): 5 mg via ORAL
  Filled 2021-02-24 (×2): qty 1

## 2021-02-24 MED ORDER — LACTATED RINGERS IV SOLN: INTRAVENOUS | Status: DC

## 2021-02-24 MED ORDER — TECHNETIUM TC 99M TILMANOCEPT KIT
1.0000 | PACK | Freq: Once | INTRAVENOUS | Status: AC | PRN
  Administered 2021-02-24: 1 via INTRADERMAL

## 2021-02-24 MED ORDER — DEXAMETHASONE SODIUM PHOSPHATE 10 MG/ML IJ SOLN
INTRAMUSCULAR | Status: DC | PRN
  Administered 2021-02-24 (×2): 8 mg via INTRAVENOUS

## 2021-02-24 MED ORDER — CHLORHEXIDINE GLUCONATE CLOTH 2 % EX PADS: 6.0000 | MEDICATED_PAD | Freq: Once | CUTANEOUS | Status: DC

## 2021-02-24 MED ORDER — MIDAZOLAM HCL 2 MG/2ML IJ SOLN
INTRAMUSCULAR | Status: AC
  Administered 2021-02-24: 2 mg via INTRAVENOUS
  Filled 2021-02-24: qty 2

## 2021-02-24 MED ORDER — SODIUM CHLORIDE (PF) 0.9 % IJ SOLN
INTRAMUSCULAR | Status: AC
  Filled 2021-02-24: qty 10

## 2021-02-24 MED ORDER — LIDOCAINE 2% (20 MG/ML) 5 ML SYRINGE
INTRAMUSCULAR | Status: DC | PRN
  Administered 2021-02-24: 80 mg via INTRAVENOUS

## 2021-02-24 MED ORDER — CHLORHEXIDINE GLUCONATE 0.12 % MT SOLN
15.0000 mL | Freq: Once | OROMUCOSAL | Status: AC
  Administered 2021-02-24: 15 mL via OROMUCOSAL
  Filled 2021-02-24: qty 15

## 2021-02-24 MED ORDER — FENTANYL CITRATE (PF) 100 MCG/2ML IJ SOLN
INTRAMUSCULAR | Status: DC | PRN
  Administered 2021-02-24: 50 ug via INTRAVENOUS

## 2021-02-24 MED ORDER — FENTANYL CITRATE (PF) 100 MCG/2ML IJ SOLN
50.0000 ug | Freq: Once | INTRAMUSCULAR | Status: DC
  Filled 2021-02-24: qty 1

## 2021-02-24 MED ORDER — LIDOCAINE 2% (20 MG/ML) 5 ML SYRINGE
INTRAMUSCULAR | Status: AC
  Filled 2021-02-24: qty 5

## 2021-02-24 MED ORDER — FENTANYL CITRATE (PF) 250 MCG/5ML IJ SOLN
INTRAMUSCULAR | Status: AC
  Filled 2021-02-24: qty 5

## 2021-02-24 MED ORDER — OXYCODONE HCL 5 MG PO TABS: 5.0000 mg | ORAL_TABLET | Freq: Four times a day (QID) | ORAL | 0 refills | Status: DC | PRN

## 2021-02-24 MED ORDER — ACETAMINOPHEN 500 MG PO TABS
1000.0000 mg | ORAL_TABLET | ORAL | Status: AC
  Administered 2021-02-24: 1000 mg via ORAL
  Filled 2021-02-24: qty 2

## 2021-02-24 MED ORDER — KETOROLAC TROMETHAMINE 15 MG/ML IJ SOLN
15.0000 mg | Freq: Four times a day (QID) | INTRAMUSCULAR | Status: DC | PRN
  Administered 2021-02-24: 15 mg via INTRAVENOUS
  Filled 2021-02-24: qty 1

## 2021-02-24 MED ORDER — ACETAMINOPHEN 500 MG PO TABS
1000.0000 mg | ORAL_TABLET | Freq: Four times a day (QID) | ORAL | Status: DC
  Administered 2021-02-24 – 2021-02-25 (×4): 1000 mg via ORAL
  Filled 2021-02-24 (×5): qty 2

## 2021-02-24 MED ORDER — ENSURE PRE-SURGERY PO LIQD
296.0000 mL | Freq: Once | ORAL | Status: DC
Start: 2021-02-25 — End: 2021-02-24

## 2021-02-24 MED ORDER — BUPIVACAINE HCL (PF) 0.5 % IJ SOLN
INTRAMUSCULAR | Status: DC | PRN
  Administered 2021-02-24: 20 mL

## 2021-02-24 MED ORDER — OXYCODONE HCL 5 MG PO TABS: 5.0000 mg | ORAL_TABLET | Freq: Once | ORAL | Status: DC | PRN

## 2021-02-24 MED ORDER — PROPOFOL 10 MG/ML IV BOLUS
INTRAVENOUS | Status: DC | PRN
  Administered 2021-02-24: 150 mg via INTRAVENOUS

## 2021-02-24 MED ORDER — BUPIVACAINE HCL (PF) 0.25 % IJ SOLN
INTRAMUSCULAR | Status: AC
  Filled 2021-02-24: qty 30

## 2021-02-24 MED ORDER — METHOCARBAMOL 500 MG PO TABS
500.0000 mg | ORAL_TABLET | Freq: Four times a day (QID) | ORAL | Status: DC | PRN
  Administered 2021-02-24: 500 mg via ORAL
  Filled 2021-02-24: qty 1

## 2021-02-24 MED ORDER — ONDANSETRON 4 MG PO TBDP: 4.0000 mg | ORAL_TABLET | Freq: Four times a day (QID) | ORAL | Status: DC | PRN

## 2021-02-24 MED ORDER — AMISULPRIDE (ANTIEMETIC) 5 MG/2ML IV SOLN: 10.0000 mg | Freq: Once | INTRAVENOUS | Status: DC | PRN

## 2021-02-24 MED ORDER — OXYCODONE HCL 5 MG/5ML PO SOLN
5.0000 mg | Freq: Once | ORAL | Status: DC | PRN
Start: 2021-02-24 — End: 2021-02-24

## 2021-02-24 MED ORDER — EPHEDRINE 5 MG/ML INJ
INTRAVENOUS | Status: AC
  Filled 2021-02-24: qty 10

## 2021-02-24 MED ORDER — PHENYLEPHRINE 40 MCG/ML (10ML) SYRINGE FOR IV PUSH (FOR BLOOD PRESSURE SUPPORT)
PREFILLED_SYRINGE | INTRAVENOUS | Status: DC | PRN
  Administered 2021-02-24: 80 ug via INTRAVENOUS
  Administered 2021-02-24: 120 ug via INTRAVENOUS
  Administered 2021-02-24: 40 ug via INTRAVENOUS
  Administered 2021-02-24 (×4): 80 ug via INTRAVENOUS
  Administered 2021-02-24 (×2): 120 ug via INTRAVENOUS

## 2021-02-24 MED ORDER — FENTANYL CITRATE (PF) 100 MCG/2ML IJ SOLN
INTRAMUSCULAR | Status: AC
  Administered 2021-02-24: 100 ug via INTRAVENOUS
  Filled 2021-02-24: qty 2

## 2021-02-24 MED ORDER — MORPHINE SULFATE (PF) 2 MG/ML IV SOLN: 1.0000 mg | INTRAVENOUS | Status: DC | PRN

## 2021-02-24 MED ORDER — ONDANSETRON HCL 4 MG/2ML IJ SOLN
INTRAMUSCULAR | Status: DC | PRN
  Administered 2021-02-24: 4 mg via INTRAVENOUS

## 2021-02-24 MED ORDER — SODIUM CHLORIDE 0.9 % IV SOLN: INTRAVENOUS | Status: DC

## 2021-02-24 MED ORDER — KETOROLAC TROMETHAMINE 15 MG/ML IJ SOLN
15.0000 mg | INTRAMUSCULAR | Status: AC
  Administered 2021-02-24: 15 mg via INTRAVENOUS
  Filled 2021-02-24: qty 1

## 2021-02-24 MED ORDER — SIMETHICONE 80 MG PO CHEW: 40.0000 mg | CHEWABLE_TABLET | Freq: Four times a day (QID) | ORAL | Status: DC | PRN

## 2021-02-24 MED ORDER — PROMETHAZINE HCL 25 MG/ML IJ SOLN: 6.2500 mg | INTRAMUSCULAR | Status: DC | PRN

## 2021-02-24 MED ORDER — PHENYLEPHRINE 40 MCG/ML (10ML) SYRINGE FOR IV PUSH (FOR BLOOD PRESSURE SUPPORT)
PREFILLED_SYRINGE | INTRAVENOUS | Status: AC
  Filled 2021-02-24: qty 30

## 2021-02-24 MED ORDER — MIDAZOLAM HCL 2 MG/2ML IJ SOLN
2.0000 mg | Freq: Once | INTRAMUSCULAR | Status: AC
  Filled 2021-02-24: qty 2

## 2021-02-24 MED ORDER — ORAL CARE MOUTH RINSE: 15.0000 mL | Freq: Once | OROMUCOSAL | Status: AC

## 2021-02-24 MED ORDER — FENTANYL CITRATE (PF) 100 MCG/2ML IJ SOLN
25.0000 ug | INTRAMUSCULAR | Status: DC | PRN
  Administered 2021-02-24: 25 ug via INTRAVENOUS

## 2021-02-24 MED ORDER — ONDANSETRON HCL 4 MG/2ML IJ SOLN
4.0000 mg | Freq: Four times a day (QID) | INTRAMUSCULAR | Status: DC | PRN
  Administered 2021-02-24: 4 mg via INTRAVENOUS
  Filled 2021-02-24: qty 2

## 2021-02-24 MED ORDER — DEXAMETHASONE SODIUM PHOSPHATE 10 MG/ML IJ SOLN
INTRAMUSCULAR | Status: AC
  Filled 2021-02-24: qty 1

## 2021-02-24 MED ORDER — CEFAZOLIN SODIUM-DEXTROSE 2-4 GM/100ML-% IV SOLN
2.0000 g | INTRAVENOUS | Status: AC
  Administered 2021-02-24: 2 g via INTRAVENOUS
  Filled 2021-02-24: qty 100

## 2021-02-24 MED ORDER — BUPIVACAINE LIPOSOME 1.3 % IJ SUSP
INTRAMUSCULAR | Status: DC | PRN
  Administered 2021-02-24: 10 mL

## 2021-02-24 MED ORDER — METHYLENE BLUE 0.5 % INJ SOLN
INTRAVENOUS | Status: AC
  Filled 2021-02-24: qty 10

## 2021-02-24 MED ORDER — PHENYLEPHRINE HCL-NACL 10-0.9 MG/250ML-% IV SOLN
INTRAVENOUS | Status: DC | PRN
  Administered 2021-02-24: 50 ug/min via INTRAVENOUS

## 2021-02-24 MED ORDER — PROPOFOL 10 MG/ML IV BOLUS
INTRAVENOUS | Status: AC
  Filled 2021-02-24: qty 20

## 2021-02-24 MED ORDER — FENTANYL CITRATE (PF) 100 MCG/2ML IJ SOLN
100.0000 ug | Freq: Once | INTRAMUSCULAR | Status: AC
  Filled 2021-02-24: qty 2

## 2021-02-24 MED ORDER — ONDANSETRON HCL 4 MG/2ML IJ SOLN
INTRAMUSCULAR | Status: AC
  Filled 2021-02-24: qty 2

## 2021-02-24 SURGICAL SUPPLY — 52 items
ADH SKN CLS APL DERMABOND .7 (GAUZE/BANDAGES/DRESSINGS) ×2
APL PRP STRL LF DISP 70% ISPRP (MISCELLANEOUS) ×1
APPLIER CLIP 9.375 MED OPEN (MISCELLANEOUS) ×2
APR CLP MED 9.3 20 MLT OPN (MISCELLANEOUS) ×1
BINDER BREAST XLRG (GAUZE/BANDAGES/DRESSINGS) ×1 IMPLANT
BINDER BREAST XXLRG (GAUZE/BANDAGES/DRESSINGS) IMPLANT
BIOPATCH RED 1 DISK 7.0 (GAUZE/BANDAGES/DRESSINGS) ×1 IMPLANT
BLADE SURG 10 STRL SS (BLADE) ×2 IMPLANT
BLADE SURG 15 STRL LF DISP TIS (BLADE) ×1 IMPLANT
BLADE SURG 15 STRL SS (BLADE) ×2
CANISTER SUCT 1200ML W/VALVE (MISCELLANEOUS) ×2 IMPLANT
CHLORAPREP W/TINT 26 (MISCELLANEOUS) ×2 IMPLANT
CLIP APPLIE 9.375 MED OPEN (MISCELLANEOUS) IMPLANT
COVER PROBE W GEL 5X96 (DRAPES) ×2 IMPLANT
DERMABOND ADVANCED (GAUZE/BANDAGES/DRESSINGS) ×2
DERMABOND ADVANCED .7 DNX12 (GAUZE/BANDAGES/DRESSINGS) ×1 IMPLANT
DRAIN CHANNEL 19F RND (DRAIN) ×2 IMPLANT
DRAPE TOP ARMCOVERS (MISCELLANEOUS) ×2 IMPLANT
DRAPE U-SHAPE 76X120 STRL (DRAPES) ×3 IMPLANT
DRAPE UTILITY XL STRL (DRAPES) ×2 IMPLANT
DRSG PAD ABDOMINAL 8X10 ST (GAUZE/BANDAGES/DRESSINGS) ×3 IMPLANT
DRSG TEGADERM 4X4.75 (GAUZE/BANDAGES/DRESSINGS) ×1 IMPLANT
ELECT REM PT RETURN 9FT ADLT (ELECTROSURGICAL) ×2
ELECTRODE REM PT RTRN 9FT ADLT (ELECTROSURGICAL) ×1 IMPLANT
EVACUATOR SILICONE 100CC (DRAIN) ×2 IMPLANT
GLOVE SURG ENC MOIS LTX SZ6.5 (GLOVE) ×2 IMPLANT
GLOVE SURG ENC MOIS LTX SZ7 (GLOVE) ×2 IMPLANT
GLOVE SURG UNDER LTX SZ7 (GLOVE) ×3 IMPLANT
GLOVE SURG UNDER POLY LF SZ7.5 (GLOVE) ×2 IMPLANT
GOWN STRL REUS W/ TWL LRG LVL3 (GOWN DISPOSABLE) ×3 IMPLANT
GOWN STRL REUS W/TWL LRG LVL3 (GOWN DISPOSABLE) ×6
HEMOSTAT ARISTA ABSORB 3G PWDR (HEMOSTASIS) ×2 IMPLANT
KIT BASIN OR (CUSTOM PROCEDURE TRAY) ×1 IMPLANT
NS IRRIG 1000ML POUR BTL (IV SOLUTION) ×2 IMPLANT
PACK GENERAL/GYN (CUSTOM PROCEDURE TRAY) ×1 IMPLANT
PENCIL SMOKE EVACUATOR (MISCELLANEOUS) ×2 IMPLANT
PIN SAFETY STERILE (MISCELLANEOUS) ×2 IMPLANT
SLEEVE SCD COMPRESS KNEE MED (STOCKING) ×2 IMPLANT
STAPLER VISISTAT 35W (STAPLE) ×1 IMPLANT
STRIP CLOSURE SKIN 1/2X4 (GAUZE/BANDAGES/DRESSINGS) ×2 IMPLANT
SUT ETHILON 2 0 FS 18 (SUTURE) ×2 IMPLANT
SUT MNCRL AB 4-0 PS2 18 (SUTURE) ×2 IMPLANT
SUT SILK 2 0 SH (SUTURE) ×1 IMPLANT
SUT VIC AB 2-0 SH 27 (SUTURE) ×2
SUT VIC AB 2-0 SH 27XBRD (SUTURE) ×2 IMPLANT
SUT VIC AB 3-0 SH 27 (SUTURE) ×2
SUT VIC AB 3-0 SH 27X BRD (SUTURE) IMPLANT
SUT VICRYL 3-0 CR8 SH (SUTURE) ×4 IMPLANT
SYR CONTROL 10ML LL (SYRINGE) ×1 IMPLANT
TOWEL GREEN STERILE FF (TOWEL DISPOSABLE) ×4 IMPLANT
TUBE CONNECTING 20X1/4 (TUBING) ×2 IMPLANT
YANKAUER SUCT BULB TIP NO VENT (SUCTIONS) ×2 IMPLANT

## 2021-02-24 NOTE — Op Note (Signed)
Preoperative diagnosis: recurrent right breast cancer Postoperative diagnosis: saa Procedure Right mastectomy Right axillary sentinel node biopsy, deep Surgeon: Dr Serita Grammes Anesthesia: general with pec block Drains 19 Fr Blake drain  Specimens:  Right mastectomy short superior, long lateral Right axillary sentinel node with count of 308 Complications none Sponge and needle count correct Dispo recovery stable  Indications: 62 yof presented with right breast cancer.  in 2008 she had a T2N1a right breast cancer that was TN treated with lumpectomy, chemotherapy and radiotherapy.  Will have to see what nodal surgery was done but I suspect this was ALND then (she does state that she only had three nodes though).  she has no rue issues.  she  noted a right upper inner quadrant breast mass.  on mm and [place] she has a 2.2 cm mass. axillary [place] is negative.  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 Initial MRI has a 2.3 cm enhancing mass in upper inner right breast otherwise negative.  no distant disease.  she has repeat mri that shows near complete resolution. she completed chemotherapy early due to neuropathy and we discussed mastectomy, repeat ax sn biopsy  Procedure: After informed consent obtained she underwent a pec block. She was injected with lymphoseek in standard periareolar fashion. She was given antibiotics.  SCDs were in place.  She was placed under general anesthesia without complication. She was prepped and draped in standard sterile surgical fashion. Timeout was performed.  She had been marked by plastic surgery first. I then made an incision that included a lot of the skin as well as the nac.  I then created flaps to the clavicle, parasternal region, IM fold and the latissimus laterally. I then removed the breast from the muscle to include the fascia.  This was marked and passed off the table. I then located a single sentinel node in the axilla and removed this.  Hemostasis was  obtained. I then placed a 19 Fr Blake drain and secured this with 2-0 nylon. I then closed this with 3-0 vicryl and with 4-0 monocryl.  I used steristrips and glue.  She tolerated well and was transferred to recovery stable.

## 2021-02-24 NOTE — Transfer of Care (Signed)
Immediate Anesthesia Transfer of Care Note  Patient: Penny Brown  Procedure(s) Performed: RIGHT MASTECTOMY WITH RIGHT AXILLARY SENTINEL LYMPH NODE BIOPSY (Right: Breast)  Patient Location: PACU  Anesthesia Type:GA combined with regional for post-op pain  Level of Consciousness: drowsy  Airway & Oxygen Therapy: Patient Spontanous Breathing and Patient connected to face mask oxygen  Post-op Assessment: Report given to RN, Post -op Vital signs reviewed and stable and Patient moving all extremities X 4  Post vital signs: Reviewed and stable  Last Vitals:  Vitals Value Taken Time  BP 120/92 02/24/21 1032  Temp    Pulse 88 02/24/21 1033  Resp 13 02/24/21 1033  SpO2 100 % 02/24/21 1033  Vitals shown include unvalidated device data.  Last Pain:  Vitals:   02/24/21 0818  TempSrc:   PainSc: 0-No pain         Complications: No notable events documented.

## 2021-02-24 NOTE — Anesthesia Postprocedure Evaluation (Signed)
Anesthesia Post Note  Patient: Penny Brown  Procedure(s) Performed: RIGHT MASTECTOMY WITH RIGHT AXILLARY SENTINEL LYMPH NODE BIOPSY (Right: Breast)     Patient location during evaluation: PACU Anesthesia Type: Regional and General Level of consciousness: awake and alert Pain management: pain level controlled Vital Signs Assessment: post-procedure vital signs reviewed and stable Respiratory status: spontaneous breathing, nonlabored ventilation and respiratory function stable Cardiovascular status: blood pressure returned to baseline and stable Postop Assessment: no apparent nausea or vomiting Anesthetic complications: no   No notable events documented.  Last Vitals:  Vitals:   02/24/21 1253 02/24/21 1309  BP: 124/80 118/87  Pulse: 88 85  Resp: 13 15  Temp: 36.5 C 36.6 C  SpO2: 98% 96%    Last Pain:  Vitals:   02/24/21 1309  TempSrc: Oral  PainSc:                  Merlinda Frederick

## 2021-02-24 NOTE — Discharge Instructions (Signed)
[facility], [place] [phone]  MASTECTOMY: POST OP INSTRUCTIONS Take 400 mg of ibuprofen every 8 hours or 650 mg tylenol every 6 hours for next 72 hours then as needed. Use ice several times daily also. Always review your discharge instruction sheet given to you by the facility where your surgery was performed. IF YOU HAVE DISABILITY OR FAMILY LEAVE FORMS, YOU MUST BRING THEM TO THE OFFICE FOR PROCESSING.   DO NOT GIVE THEM TO YOUR DOCTOR. A prescription for pain medication may be given to you upon discharge.  Take your pain medication as prescribed, if needed.  If narcotic pain medicine is not needed, then you may take acetaminophen (Tylenol), naprosyn (Alleve) or ibuprofen (Advil) as needed. Take your usually prescribed medications unless otherwise directed. If you need a refill on your pain medication, please contact your pharmacy.  They will contact our office to request authorization.  Prescriptions will not be filled after 5pm or on week-ends. You should follow a light diet the first few days after arrival home, such as soup and crackers, etc.  Resume your normal diet the day after surgery. Most patients will experience some swelling and bruising on the chest and underarm.  Ice packs will help.  Swelling and bruising can take several days to resolve. Wear the binder day and night until you return to the office.  It is common to experience some constipation if taking pain medication after surgery.  Increasing fluid intake and taking a stool softener (such as Colace) will usually help or prevent this problem from occurring.  A mild laxative (Milk of Magnesia or Miralax) should be taken according to package instructions if there are no bowel movements after 48 hours. Unless discharge instructions indicate otherwise, leave your bandage dry and in place until your next appointment in 3-5 days.  You may take a limited sponge bath.  No tube baths or showers until the drains are removed.   You may have steri-strips (small skin tapes) in place directly over the incision.  These strips should be left on the skin for 7-10 days. If you have glue it will come off in next couple week.  Any sutures will be removed at an office visit DRAINS:  If you have drains in place, it is important to keep a list of the amount of drainage produced each day in your drains.  Before leaving the hospital, you should be instructed on drain care.  Call our office if you have any questions about your drains. I will remove your drains when they put out less than 30 cc or ml for 2 consecutive days. ACTIVITIES:  You may resume regular (light) daily activities beginning the next day--such as daily self-care, walking, climbing stairs--gradually increasing activities as tolerated.  You may have sexual intercourse when it is comfortable.  Refrain from any heavy lifting or straining until approved by your doctor. You may drive when you are no longer taking prescription pain medication, you can comfortably wear a seatbelt, and you can safely maneuver your car and apply brakes. RETURN TO WORK:  __________________________________________________________ Dennis Bast should see your doctor in the office for a follow-up appointment approximately 3-5 days after your surgery.  Your doctor's nurse will typically make your follow-up appointment when she calls you with your pathology report.  Expect your pathology report 3-4business days after surgery. OTHER INSTRUCTIONS: ______________________________________________________________________________________________ ____________________________________________________________________________________________ WHEN TO CALL YOUR DR Valerie Fredin: Fever over 101.0 Nausea and/or vomiting Extreme swelling or bruising Continued bleeding from incision. Increased pain, redness,  or drainage from the incision. The clinic staff is available to answer your questions during regular business hours.  Please don't  hesitate to call and ask to speak to one of the nurses for clinical concerns.  If you have a medical emergency, go to the nearest emergency room or call 911.  A surgeon from [facility] is always on call at the hospital. [address] [place], [place], [place]  [address] ? [address], [place], [place]   [address] [phone] ? [phone] ? FAX [phone] Web site: [email or web address]

## 2021-02-24 NOTE — Anesthesia Procedure Notes (Signed)
Procedure Name: LMA Insertion Date/Time: 02/24/2021 8:42 AM Performed by: Ezequiel Kayser, CRNA Pre-anesthesia Checklist: Patient identified, Emergency Drugs available, Suction available and Patient being monitored Patient Re-evaluated:Patient Re-evaluated prior to induction Oxygen Delivery Method: Circle System Utilized Preoxygenation: Pre-oxygenation with 100% oxygen Induction Type: IV induction Ventilation: Mask ventilation without difficulty LMA: LMA inserted LMA Size: 4.0 Number of attempts: 1 Airway Equipment and Method: Bite block Placement Confirmation: positive ETCO2 Tube secured with: Tape Dental Injury: Teeth and Oropharynx as per pre-operative assessment

## 2021-02-24 NOTE — H&P (Signed)
Penny Brown is an 50 y.o. female.   Chief Complaint: breast cancer, recurrent HPI: 65 yof presented with right breast cancer.  in 2008 she had a T2N1a right breast cancer that was TN treated with lumpectomy, chemotherapy and radiotherapy.  Will have to see what nodal surgery was done but I suspect this was ALND then (she does state that she only had three nodes though).  she has no rue issues.  she  noted a right upper inner quadrant breast mass.  on mm and [place] she has a 2.2 cm mass. axillary [place] is negative.  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 initial MRI has a 2.3 cm enhancing mass in upper inner right breast otherwise negative.  no distant disease.  she has repeat mri that shows near complete resolution. she completed chemotherapy early due to neuropathy and comes in today to discuss options  Past Medical History:  Diagnosis Date   Breast cancer [facility]) 2008   Right Breast Cancer   Breast cancer ([place]) 09/2020   Right Breast   Cancer ([place])    Family history of breast cancer    Family history of prostate cancer    Personal history of chemotherapy 2008   Right Breast Cancer   Personal history of radiation therapy 2008   Right Breast Cancer    Past Surgical History:  Procedure Laterality Date   BREAST LUMPECTOMY Right 2008   CESAREAN SECTION     PORTACATH PLACEMENT Left 10/13/2020   Procedure: INSERTION PORT-A-CATH;  Surgeon: [name], MD;  Location: [facility];  Service: General;  Laterality: Left;  LEAVE PORT ACCESSED CHEMO STARTS ON 2/10, LEFT SIDE    Family History  Problem Relation Age of Onset   Breast cancer Maternal Grandmother 78   Prostate cancer Maternal Grandfather        dx 21s, metastatic   Social History:  reports that she has never smoked. She has never used smokeless tobacco. She reports that she does not drink alcohol and does not use drugs.  Allergies: No Known Allergies  Medications Prior to Admission  Medication Sig  Dispense Refill   lidocaine-prilocaine (EMLA) cream Apply 1 application topically as needed (port access).     LORazepam (ATIVAN) 0.5 MG tablet Take one tablet before coming to cancer center for chemo treatment (Patient taking differently: Take 0.5 mg by mouth See admin instructions. Take one tablet before coming to cancer center for chemo treatment) 30 tablet 0   traMADol (ULTRAM) 50 MG tablet Take 1 tablet (50 mg total) by mouth every 6 (six) hours as needed. (Patient taking differently: Take 50 mg by mouth every 6 (six) hours as needed for severe pain.) 8 tablet 0    Results for orders placed or performed during the hospital encounter of 02/24/21 (from the past 48 hour(s))  Pregnancy, urine POC     Status: None   Collection Time: 02/24/21  7:47 AM  Result Value Ref Range   Preg Test, Ur NEGATIVE NEGATIVE    Comment:        THE SENSITIVITY OF THIS METHODOLOGY IS >24 mIU/mL    No results found.  Review of Systems  Blood pressure 111/79, pulse 92, temperature (!) 97.5 F (36.4 C), temperature source Oral, resp. rate 17, height 5\' 2"  (1.575 m), weight 75.3 kg, SpO2 99 %. Physical Exam  General Mental Status - Alert. Orientation - Oriented X3. Breast Nipples - No Discharge. Breast Lump - No Palpable  Breast Mass. Lymphatic Note:  no [place] adenopathy Cv rrr  Lungs clear  Assessment/Plan  BREAST CANCER OF UPPER-INNER QUADRANT OF RIGHT FEMALE BREAST (C50.211) Right mastectomy, attempt repeat sn biopsy   [name], MD 02/24/2021, 8:19 AM

## 2021-02-24 NOTE — Anesthesia Preprocedure Evaluation (Addendum)
Anesthesia Evaluation  Patient identified by MRN, date of birth, ID band Patient awake    Reviewed: Allergy & Precautions, NPO status   Airway Mallampati: II  TM Distance: >3 FB Neck ROM: Full    Dental no notable dental hx.    Pulmonary neg pulmonary ROS,    Pulmonary exam normal breath sounds clear to auscultation       Cardiovascular Exercise Tolerance: Good negative cardio ROS Normal cardiovascular exam Rhythm:Regular Rate:Normal     Neuro/Psych negative neurological ROS  negative psych ROS   GI/Hepatic negative GI ROS, Neg liver ROS,   Endo/Other  negative endocrine ROS  Renal/GU negative Renal ROS  negative genitourinary   Musculoskeletal negative musculoskeletal ROS (+)   Abdominal   Peds  Hematology negative hematology ROS (+)   Anesthesia Other Findings Right breast cancer  Reproductive/Obstetrics negative OB ROS                            Anesthesia Physical Anesthesia Plan  ASA: 3  Anesthesia Plan: General and Regional   Post-op Pain Management: GA combined w/ Regional for post-op pain   Induction:   PONV Risk Score and Plan: 3 and Midazolam, Treatment may vary due to age or medical condition, Ondansetron and Dexamethasone  Airway Management Planned: LMA  Additional Equipment: None  Intra-op Plan:   Post-operative Plan: Extubation in OR  Informed Consent: I have reviewed the patients History and Physical, chart, labs and discussed the procedure including the risks, benefits and alternatives for the proposed anesthesia with the patient or authorized representative who has indicated his/her understanding and acceptance.     Dental advisory given  Plan Discussed with: CRNA, Anesthesiologist and Surgeon  Anesthesia Plan Comments: (PEC block with exparel. GA/LMA. 1 PIV. )        Anesthesia Quick Evaluation

## 2021-02-24 NOTE — Anesthesia Procedure Notes (Signed)
Anesthesia Regional Block: Pectoralis block   Pre-Anesthetic Checklist: , timeout performed,  Correct Patient, Correct Site, Correct Laterality,  Correct Procedure, Correct Position, site marked,  Risks and benefits discussed,  Surgical consent,  Pre-op evaluation,  At surgeon's request and post-op pain management  Laterality: Right  Prep: chloraprep       Needles:  Injection technique: Single-shot  Needle Type: Echogenic Stimulator Needle     Needle Length: 10cm  Needle Gauge: 20     Additional Needles:   Procedures:, nerve stimulator,,, ultrasound used (permanent image in chart),,     Nerve Stimulator or Paresthesia:  Response: quadraceps contraction, 0.45 mA  Additional Responses:   Narrative:  Start time: 02/24/2021 8:05 AM End time: 02/24/2021 8:10 AM Injection made incrementally with aspirations every 5 mL.  Performed by: Personally  Anesthesiologist: Merlinda Frederick, MD  Additional Notes: Functioning IV was confirmed and monitors were applied.   Sterile prep and drape,hand hygiene and sterile gloves were used. Ultrasound guidance: relevant anatomy identified, needle position confirmed, local anesthetic spread visualized around nerve(s)., vascular puncture avoided.  Image printed for medical record. Negative aspiration and negative test dose prior to incremental administration of local anesthetic. The patient tolerated the procedure well.

## 2021-02-24 NOTE — Interval H&P Note (Signed)
History and Physical Interval Note:  02/24/2021 8:22 AM  Penny Brown  has presented today for surgery, with the diagnosis of RIGHT BREAST CANCER.  The various methods of treatment have been discussed with the patient and family. After consideration of risks, benefits and other options for treatment, the patient has consented to  Procedure(s) with comments: RIGHT MASTECTOMY WITH RIGHT AXILLARY SENTINEL LYMPH NODE BIOPSY (Right) - 210 MINUTES ROOM 8 as a surgical intervention.  The patient's history has been reviewed, patient examined, no change in status, stable for surgery.  I have reviewed the patient's chart and labs.  Questions were answered to the patient's satisfaction.     [name]

## 2021-02-25 ENCOUNTER — Encounter (HOSPITAL_COMMUNITY): Payer: Self-pay | Admitting: General Surgery

## 2021-02-25 DIAGNOSIS — C50211 Malignant neoplasm of upper-inner quadrant of right female breast: Secondary | ICD-10-CM | POA: Diagnosis not present

## 2021-02-25 MED ORDER — METHOCARBAMOL 750 MG PO TABS: 750.0000 mg | ORAL_TABLET | Freq: Three times a day (TID) | ORAL | 2 refills | Status: DC | PRN

## 2021-02-25 NOTE — Discharge Summary (Signed)
Physician Discharge Summary  Patient ID: Penny Brown MRN: [id number] DOB/AGE: 1971/07/10 50 y.o.  Admit date: 02/24/2021 Discharge date: 02/25/2021  Admission Diagnoses: Breast cancer, right  Discharge Diagnoses:  Active Problems:   S/P mastectomy, right   Discharged Condition: good  Hospital Course: 17 yof s/p right mastectomy, right ax sn biopsy doing well postoperatively. Will dc home today  Consults: None  Significant Diagnostic Studies: none  Treatments: surgery: right mastectomy, right ax sn biopsy  Discharge Exam: Blood pressure 117/85, pulse 90, temperature 98.2 F (36.8 C), temperature source Oral, resp. rate 16, height 5\' 3"  (1.6 m), weight 74.8 kg, SpO2 100 %. Right mastectomy incisoin intact, no hematoma, drain as expected  Disposition: Discharge disposition: 01-Home or Self Care        Allergies as of 02/25/2021   No Known Allergies      Medication List     TAKE these medications    lidocaine-prilocaine cream Commonly known as: EMLA Apply 1 application topically as needed (port access).   methocarbamol 750 MG tablet Commonly known as: ROBAXIN Take 1 tablet (750 mg total) by mouth every 8 (eight) hours as needed (use for muscle cramps/pain).   oxyCODONE 5 MG immediate release tablet Commonly known as: Oxy IR/ROXICODONE Take 1 tablet (5 mg total) by mouth every 6 (six) hours as needed for moderate pain, severe pain or breakthrough pain.       ASK your doctor about these medications    LORazepam 0.5 MG tablet Commonly known as: ATIVAN Take one tablet before coming to cancer center for chemo treatment   traMADol 50 MG tablet Commonly known as: ULTRAM Take 1 tablet (50 mg total) by mouth every 6 (six) hours as needed.        Follow-up Information     [name], MD Follow up in 3 week(s).   Specialty: General Surgery Contact information: [address] [place] [address] [phone]                  Signed: [name] 02/25/2021, 7:09 AM

## 2021-02-25 NOTE — Plan of Care (Signed)
Pt received breast cancer bag and education on JP drains and states she has good understanding of how to care for JP drains at home.  This will be reviewed with pt's husband when he arrives to the hospital. Pt has chart to record amount of drainage at home.

## 2021-02-25 NOTE — Progress Notes (Signed)
Osborne Casco to be D/C'd  per MD order.  Discussed with the patient and all questions fully answered.  VSS, Skin clean, dry and intact without evidence of skin break down, no evidence of skin tears noted.  IV catheter discontinued intact. Site without signs and symptoms of complications. Dressing and pressure applied.  An After Visit Summary was printed and given to the patient. Patient will pick up prescription from pharmacy.  D/c education completed with patient/family including follow up instructions, medication list, d/c activities limitations if indicated, with other d/c instructions as indicated by MD - patient able to verbalize understanding, all questions fully answered.   Patient instructed to return to ED, call 911, or call MD for any changes in condition.   Patient to be escorted via [place], and D/C home via private auto.

## 2021-03-01 LAB — SURGICAL PATHOLOGY

## 2021-03-08 ENCOUNTER — Encounter: Payer: Self-pay | Admitting: *Deleted

## 2021-03-14 ENCOUNTER — Encounter: Payer: Self-pay | Admitting: Physical Therapy

## 2021-03-14 ENCOUNTER — Ambulatory Visit: Payer: 59 | Attending: General Surgery | Admitting: Physical Therapy

## 2021-03-14 ENCOUNTER — Other Ambulatory Visit: Payer: Self-pay

## 2021-03-14 DIAGNOSIS — Z483 Aftercare following surgery for neoplasm: Secondary | ICD-10-CM | POA: Diagnosis present

## 2021-03-14 DIAGNOSIS — C50211 Malignant neoplasm of upper-inner quadrant of right female breast: Secondary | ICD-10-CM | POA: Diagnosis present

## 2021-03-14 DIAGNOSIS — Z171 Estrogen receptor negative status [ER-]: Secondary | ICD-10-CM | POA: Insufficient documentation

## 2021-03-14 DIAGNOSIS — R293 Abnormal posture: Secondary | ICD-10-CM | POA: Insufficient documentation

## 2021-03-14 DIAGNOSIS — M25611 Stiffness of right shoulder, not elsewhere classified: Secondary | ICD-10-CM | POA: Diagnosis present

## 2021-03-14 NOTE — Patient Instructions (Addendum)
            [facility]         [address], [place] [address]         [phone]         Annia Friendly, PT, CLT   After Breast Cancer Class It is recommended you attend the ABC class to be educated on lymphedema risk reduction. This class is free of charge and lasts for 1 hour. It is a 1-time class.  You are scheduled for July 18th at 11:00. We will send you a link and you need Webex to access the link.  Scar massage You aren't ready to do this yet but once steri-strips come off and incisions are closed, you can begin gentle scar massage with coconut oil a few minutes each day.  Compression garment Continue wearing your compression bra until you have no swelling.   Home exercise Program Once your drain is out, you can begin the exercises doing them to your tolerance.  Follow up PT: It is recommended you return every 3 months for the first 3 years following surgery to be assessed on the SOZO machine for an L-Dex score. This helps prevent clinically significant lymphedema in 95% of patients. These follow up screens are 10 minute appointments that you are not billed for. WE ARE SCHEDULED TO MOVE TO [place] June 06, 2021. APPOINTMENTS FOR SOZO SCREENS AFTER 06/06/2021 WILL BE LOCATED AT [facility] AT [address] [place] [address]. Please call us to confirm we have moved if your appointment is scheduled after October 3rd, 2022. The phone number is [phone]. You are scheduled for September 26th at 2:50 pm.

## 2021-03-14 NOTE — Therapy (Signed)
[place], [place], [address] Phone: [phone]   Fax:  [phone]  Physical Therapy Treatment  Patient Details  Name: Penny Brown MRN: [id number] Date of Birth: Jun 24, 1971 Referring Provider (PT): Dr. [name]   Encounter Date: 03/14/2021   PT End of Session - 03/14/21 1341     Visit Number 2    Number of Visits 10    Date for PT Re-Evaluation 04/11/21    PT Start Time 8453    PT Stop Time 1350    PT Time Calculation (min) 43 min    Activity Tolerance Patient tolerated treatment well    Behavior During Therapy [facility] for tasks assessed/performed             Past Medical History:  Diagnosis Date   Breast cancer ([place]) 2008   Right Breast Cancer   Breast cancer ([place]) 09/2020   Right Breast   Cancer ([place])    Family history of breast cancer    Family history of prostate cancer    Personal history of chemotherapy 2008   Right Breast Cancer   Personal history of radiation therapy 2008   Right Breast Cancer    Past Surgical History:  Procedure Laterality Date   BREAST LUMPECTOMY Right 2008   CESAREAN SECTION     MASTECTOMY W/ SENTINEL NODE BIOPSY Right 02/24/2021   Procedure: RIGHT MASTECTOMY WITH RIGHT AXILLARY SENTINEL LYMPH NODE BIOPSY;  Surgeon: [name], MD;  Location: [place];  Service: General;  Laterality: Right;   PORTACATH PLACEMENT Left 10/13/2020   Procedure: INSERTION PORT-A-CATH;  Surgeon: [name], MD;  Location: [place];  Service: General;  Laterality: Left;  LEAVE PORT ACCESSED CHEMO STARTS ON 2/10, LEFT SIDE    There were no vitals filed for this visit.   Subjective Assessment - 03/14/21 1309     Subjective Patient reports she underwent a right mastectomy and sentinel node biopsy (1 negative node) on 02/24/2021. She still has a drain in and reports she is draining 10 cc/day. She did neoadjuvant chemotherapy prior to surgery  ending 02/03/2021 but she will continue on Keytruda.    Pertinent History Patient was diagnosed on 09/22/2020 with right triple negative breast cancer. It measures 2.2 cm and is located in the upper inner quadrant with a Ki67 of 80%. She underwent a right mastectomy and sentinel node biopsy (1 negative node) on 02/24/2021. She did neoadjuvant chemotherapy prior to surgery. She had previous right breast cancer in 2008 with a lumpectomy, sentinel node biopsy (pt believes they took 4 lymph nodes and 1 was positive), chemotherapy and radiation.    Patient Stated Goals See how my arm is doing    Currently in Pain? No/denies                [facility] PT Assessment - 03/14/21 0001       Assessment   Medical Diagnosis s/p right mastectomy and SLNB    Referring Provider (PT) Dr. [name]    Onset Date/Surgical Date 02/24/21    Hand Dominance Right    Prior Therapy Baselines      Precautions   Precautions Other (comment)    Precaution Comments recent surgery and right arm lymphedema risk      Restrictions   Weight Bearing Restrictions No      Balance Screen   Has the patient fallen in the past 6 months No    Has the patient had a decrease  in activity level because of a fear of falling?  No    Is the patient reluctant to leave their home because of a fear of falling?  No      Home Environment   Living Environment Private residence    Living Arrangements Spouse/significant other;Children   27 and 41 y.o. daughters   Available Help at Discharge Family      Prior Function   Level of Independence Independent    Vocation Full time employment    Mudlogger with [employer]      Cognition   Overall Cognitive Status Within Functional Limits for tasks assessed      Observation/Other Assessments   Observations Right chest incisions appear to be healing well. Drain is still in place and she appears to be draining almost no fluid. Mild edema  present superior to her incisions.      Posture/Postural Control   Posture/Postural Control Postural limitations    Postural Limitations Rounded Shoulders;Forward head      ROM / Strength   AROM / PROM / Strength AROM      AROM   AROM Assessment Site Shoulder    Right/Left Shoulder Right    Right Shoulder Extension 32 Degrees    Right Shoulder Flexion 84 Degrees    Right Shoulder ABduction 60 Degrees      Strength   Overall Strength Within functional limits for tasks performed               LYMPHEDEMA/ONCOLOGY QUESTIONNAIRE - 03/14/21 0001       Type   Cancer Type Right breast cancer      Surgeries   Mastectomy Date 02/24/21    Sentinel Lymph Node Biopsy Date 02/24/21    Number Lymph Nodes Removed 1   But had 4 out in 2008     Treatment   Active Chemotherapy Treatment No    Past Chemotherapy Treatment Yes    Date 02/03/21   Also had chemo in 2008   Active Radiation Treatment No    Past Radiation Treatment Yes    Date --   2008   Body Site right breast    Current Hormone Treatment No    Past Hormone Therapy No      What other symptoms do you have   Are you Having Heaviness or Tightness Yes    Are you having Pain No    Are you having pitting edema No    Is it Hard or Difficult finding clothes that fit No    Do you have infections No    Is there Decreased scar mobility Yes    Stemmer Sign No      Lymphedema Assessments   Lymphedema Assessments Upper extremities      Right Upper Extremity Lymphedema   10 cm Proximal to Olecranon Process 29.7 cm    Olecranon Process 24.3 cm    10 cm Proximal to Ulnar Styloid Process 23.8 cm    Just Proximal to Ulnar Styloid Process 15.8 cm    Across Hand at [employer] 18.5 cm    At [place] of 2nd Digit 5.7 cm      Left Upper Extremity Lymphedema   10 cm Proximal to Olecranon Process 29.3 cm    Olecranon Process 24 cm    10 cm Proximal to Ulnar Styloid Process 23 cm    Just Proximal to Ulnar Styloid Process 15.4 cm  Across Hand at [employer] 18.7 cm    At [place] of 2nd Digit 5.7 cm                Katina Dung - 03/14/21 0001     Open a tight or new jar Moderate difficulty    Do heavy household chores (wash walls, wash floors) Severe difficulty    Carry a shopping bag or briefcase Mild difficulty    Wash your back Mild difficulty    Use a knife to cut food Mild difficulty    Recreational activities in which you take some force or impact through your arm, shoulder, or hand (golf, hammering, tennis) Severe difficulty    During the past week, to what extent has your arm, shoulder or hand problem interfered with your normal social activities with family, friends, neighbors, or groups? Modererately    During the past week, to what extent has your arm, shoulder or hand problem limited your work or other regular daily activities Extremely    Arm, shoulder, or hand pain. Moderate    Tingling (pins and needles) in your arm, shoulder, or hand Mild    Difficulty Sleeping Mild difficulty    DASH Score 47.73 %                            PT Education - 03/14/21 1341     Education Details Aftercare; lymphedema education    Person(s) Educated Patient    Methods Explanation;Handout    Comprehension Returned demonstration;Verbalized understanding                 PT Long Term Goals - 03/14/21 1344       PT LONG TERM GOAL #1   Title Patient will demonstrate she has regained full shoulder ROM and function post operatively compared to baselines.    Time 4    Period Weeks    Status On-going    Target Date 04/11/21      PT LONG TERM GOAL #2   Title Patient will increase right shoulder active flexion to >/= 150 degrees for ability to reach overhead.    Baseline 84    Time 4    Period Weeks    Status New    Target Date 04/11/21      PT LONG TERM GOAL #3   Title Patient will increase right shoulder active abduction to >/= 150 degrees for ability to reach overhead.     Baseline 60    Time 4    Period Weeks    Status New    Target Date 04/11/21      PT LONG TERM GOAL #4   Title Patient will report good understanding of Lymphedema risk reduction practices.    Time 4    Period Weeks    Status New    Target Date 04/11/21      PT LONG TERM GOAL #5   Title Patient will improve her DASH score to be </= 10 for improved overall upper extremity function.    Baseline 47    Time 4    Period Weeks    Status New    Target Date 04/11/21                   Plan - 03/14/21 1342     Clinical Impression Statement Patient is doing well s/p right mastectomy and sentinel node biopsy on 02/24/2021. She still has 1 drain  in place and reports draining < 10 cc/day. Phoned surgeon to see if drain could be removed earlier than 03/25/2021 as it is limited shoulder AROM and they moved her appointment to 03/15/2021. She will benefit from PT to regain shoulder ROM and function and to return to work.    PT Frequency 2x / week    PT Duration 4 weeks    PT Treatment/Interventions ADLs/Self Care Home Management;Therapeutic exercise;Patient/family education;Manual techniques;Manual lymph drainage;Passive range of motion;Scar mobilization    PT Next Visit Plan Begin gentle PROM and begin AAROM exercises right shoulder    PT Home Exercise Plan Post op shoulder ROM HEP    Consulted and Agree with Plan of Care Patient             Patient will benefit from skilled therapeutic intervention in order to improve the following deficits and impairments:  Postural dysfunction, Decreased knowledge of precautions, Impaired UE functional use, Pain, Decreased range of motion, Decreased scar mobility, Increased fascial restricitons, Decreased strength  Visit Diagnosis: Malignant neoplasm of upper-inner quadrant of right breast in female, estrogen receptor negative ([place]) - Plan: PT plan of care cert/re-cert  Abnormal posture - Plan: PT plan of care cert/re-cert  Aftercare following  surgery for neoplasm - Plan: PT plan of care cert/re-cert  Stiffness of right shoulder, not elsewhere classified - Plan: PT plan of care cert/re-cert     Problem List Patient Active Problem List   Diagnosis Date Noted   S/P mastectomy, right 02/24/2021   Port-A-Cath in place 10/21/2020   Genetic testing 10/20/2020   Malignant neoplasm of upper-outer quadrant of right breast in female, estrogen receptor negative ([place]) 09/29/2020   Family history of breast cancer    Family history of prostate cancer    Malignant neoplasm of upper-inner quadrant of right breast in female, estrogen receptor negative ([place]) 09/27/2020   Annia Friendly, PT 03/14/21 2:00 PM   [place], [place], [address] Phone: [phone]   Fax:  [phone]  Name: Penny Brown MRN: [id number] Date of Birth: 03-08-1971

## 2021-03-16 NOTE — Progress Notes (Signed)
[place]  Telephone:(336) [phone] Fax:([phone]    ID: Penny Brown DOB: 07-12-1971  MR#: [id number]  GQQ#:[id number]  Patient Care Team: Penny Brown, Penny Brown as PCP - General (Family Medicine) Penny Kaufmann, RN as Oncology Nurse Navigator Penny Germany, RN as Oncology Nurse Navigator Penny Brown, Penny Dad, MD as Consulting Physician (Oncology) Penny Bookbinder, MD as Consulting Physician (General Surgery) Penny Rudd, MD as Consulting Physician (Radiation Oncology) Penny Cruel, MD OTHER MD:  CHIEF COMPLAINT: recurrent vs. new triple negative breast cancer (s/p right mastectomy)  CURRENT TREATMENT: Continuing pembrolizumab   INTERVAL HISTORY: Penny Brown was scheduled today 03/17/2021 for follow up and treatment of her second triple negative breast cancer.  However she did not show  Since her last visit, she underwent right mastectomy on 02/24/2021 under Dr. Donne Brown. Pathology from the procedure [id number]) showed: invasive ductal carcinoma, grade 3, 0.3 cm; tumor involves area of stellate fibrosis in upper medial breast (at 1 o'clock); no carcinoma identified in 3 cm fibrocystic cavitary area in mid lateral breast.  The biopsied lymph node was negative for carcinoma (0/1).  She continues on pembrolizumab every 21 days.  She has a dose due today.   REVIEW OF SYSTEMS: Penny Brown     COVID 19 VACCINATION STATUS: fully vaccinated Penny Brown), with booster 06/2020   HISTORY OF CURRENT ILLNESS: From the original intake note:  Penny Brown has a history of 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 She received 4 cycles of doxorubicin and cyclophosphamide followed by 4 cycles of of paclitaxel, which was discontinued secondary to neuropathy. She was released from follow up here in 2012.  More recently, she presented with a palpable upper-inner right  breast mass. She underwent bilateral diagnostic mammography with tomography and right breast ultrasonography at [facility] on 09/22/2020 showing: breast density category A; 2.2 cm irregular mass in upper-inner right breast; no significant abnormalities in right axilla.  Accordingly on 09/22/2020 she proceeded to biopsy of the right breast area in question. The pathology from this procedure ([id number]) showed: invasive ductal carcinoma, grade 3. Prognostic indicators significant for: estrogen receptor, 0% negative and progesterone receptor, 0% negative. Proliferation marker Ki67 at 80%. HER2 negatve by immunohistochemistry (1+).  Cancer Staging Malignant neoplasm of upper-inner quadrant of right breast in female, estrogen receptor negative (Penny Brown) Staging form: Breast, AJCC 8th Edition - Clinical stage from 09/29/2020: Stage IIB (rcT2, cN0, cM0, G3, ER-, PR-, HER2-) - Signed by Penny Cruel, MD on 09/29/2020 Stage prefix: Recurrence  Malignant neoplasm of upper-outer quadrant of right breast in female, estrogen receptor negative (Penny Brown) Staging form: Breast, AJCC 8th Edition - Clinical: cT2, cN1, cM0, ER-, PR-, HER2- - Signed by Penny Cruel, MD on 09/29/2020 Stage prefix: Initial diagnosis Nuclear grade: G3  The patient's subsequent history is as detailed below.   PAST MEDICAL HISTORY: Past Medical History:  Diagnosis Date   Breast cancer ([place]) 2008   Right Breast Cancer   Breast cancer ([place]) 09/2020   Right Breast   Cancer ([place])    Family history of breast cancer    Family history of prostate cancer    Personal history of chemotherapy 2008   Right Breast Cancer   Personal history of radiation therapy 2008   Right Breast Cancer    PAST SURGICAL HISTORY: Past Surgical History:  Procedure Laterality Date   BREAST LUMPECTOMY Right 2008   CESAREAN SECTION     MASTECTOMY W/ SENTINEL  NODE BIOPSY Right 02/24/2021   Procedure: RIGHT MASTECTOMY WITH RIGHT AXILLARY SENTINEL LYMPH NODE  BIOPSY;  Surgeon: Penny Bookbinder, MD;  Location: [facility];  Service: General;  Laterality: Right;   PORTACATH PLACEMENT Left 10/13/2020   Procedure: INSERTION PORT-A-CATH;  Surgeon: Penny Bookbinder, MD;  Location: [place];  Service: General;  Laterality: Left;  LEAVE PORT ACCESSED CHEMO STARTS ON 2/10, LEFT SIDE    FAMILY HISTORY: Family History  Problem Relation Age of Onset   Breast cancer Maternal Grandmother 78   Prostate cancer Maternal Grandfather        dx 29s, metastatic   Her father died from heart attack (possibly Covid?) at age 33. Her mother is age 19 as of 09/2020. Penny Brown has 2 brothers and 2 sisters. She reports breast cancer in her maternal grandmother in her late 54's.   GYNECOLOGIC HISTORY:  No LMP recorded. Menarche: 49 years old Age at first live birth: 50 years old [place] P 3 LMP 09/09/2020, periods are regular, 7 days with 4 heavy Contraceptive: s/p BTL with coils in [place] HRT n/a  Hysterectomy? no BSO? 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 Husband Penny Brown is a Cabin crew. She lives at home with Penny Brown, daughter Penny Brown, age 44, and daughter Penny Brown, age 36 who works as a Curator. Son Penny Brown, age 84, is a Psychiatric nurse in [employer]. Penny Brown has one grandchild. She attends [employer].    ADVANCED DIRECTIVES: In the absence of any documentation to the contrary, the patient's spouse is their HCPOA.    HEALTH MAINTENANCE: Social History   Tobacco Use   Smoking status: Never   Smokeless tobacco: Never  Vaping Use   Vaping Use: Never used  Substance Use Topics   Alcohol use: No   Drug use: No     Colonoscopy: n/a (age)   PAP: 2019  Bone density: n/a (age)   No Known Allergies  Current Outpatient Medications  Medication Sig Dispense Refill   lidocaine-prilocaine (EMLA) cream Apply 1 application topically as needed (port access).      LORazepam (ATIVAN) 0.5 MG tablet Take one tablet before coming to cancer center for chemo treatment (Patient taking differently: Take 0.5 mg by mouth See admin instructions. Take one tablet before coming to cancer center for chemo treatment) 30 tablet 0   methocarbamol (ROBAXIN) 750 MG tablet Take 1 tablet (750 mg total) by mouth every 8 (eight) hours as needed (use for muscle cramps/pain). 30 tablet 2   oxyCODONE (OXY IR/ROXICODONE) 5 MG immediate release tablet Take 1 tablet (5 mg total) by mouth every 6 (six) hours as needed for moderate pain, severe pain or breakthrough pain. 15 tablet 0   traMADol (ULTRAM) 50 MG tablet Take 1 tablet (50 mg total) by mouth every 6 (six) hours as needed. (Patient taking differently: Take 50 mg by mouth every 6 (six) hours as needed for severe pain.) 8 tablet 0   No current facility-administered medications for this visit.    OBJECTIVE:   There were no vitals filed for this visit.    There is no height or weight on file to calculate BMI.   Wt Readings from Last 3 Encounters:  02/24/21 165 lb (74.8 kg)  02/18/21 165 lb 1.6 oz (74.9 kg)  02/03/21 166 lb 3.2 oz (75.4 kg)     ECOG FS:1 - Symptomatic but completely ambulatory  LAB RESULTS:  CMP     Component Value Date/Time   NA 138 02/03/2021 1348   K 4.0 02/03/2021 1348   CL 106 02/03/2021 1348   CO2 22 02/03/2021 1348   GLUCOSE 75 02/03/2021 1348   BUN 13 02/03/2021 1348   CREATININE 0.77 02/03/2021 1348   CREATININE 0.71 10/28/2020 1135   CALCIUM 9.3 02/03/2021 1348   PROT 7.6 02/03/2021 1348   ALBUMIN 3.8 02/03/2021 1348   AST 17 02/03/2021 1348   AST 18 10/28/2020 1135   ALT 10 02/03/2021 1348   ALT 20 10/28/2020 1135   ALKPHOS 102 02/03/2021 1348   BILITOT <0.2 (L) 02/03/2021 1348   BILITOT 0.3 10/28/2020 1135   GFRNONAA >60 02/03/2021 1348   GFRNONAA >60 10/28/2020 1135   GFRAA  04/11/2007 1225    >60        The eGFR has been calculated using the MDRD equation. This  calculation has not been validated in all clinical    No results found for: TOTALPROTELP, ALBUMINELP, A1GS, A2GS, BETS, BETA2SER, GAMS, MSPIKE, SPEI  Lab Results  Component Value Date   WBC 5.6 02/03/2021   NEUTROABS 2.7 02/03/2021   HGB 10.0 (L) 02/03/2021   HCT 31.6 (L) 02/03/2021   MCV 93.5 02/03/2021   PLT 238 02/03/2021    Lab Results  Component Value Date   LABCA2 17 11/25/2007    No components found for: [id number]  No results for input(s): INR in the last 168 hours.  Lab Results  Component Value Date   LABCA2 17 11/25/2007    No results found for: CHY850  No results found for: YDX412  No results found for: INO676  No results found for: CA2729  No components found for: HGQUANT  No results found for: CEA1 / No results found for: CEA1   No results found for: AFPTUMOR  No results found for: CHROMOGRNA  No results found for: KPAFRELGTCHN, LAMBDASER, KAPLAMBRATIO (kappa/lambda light chains)  No results found for: HGBA, HGBA2QUANT, HGBFQUANT, HGBSQUAN (Hemoglobinopathy evaluation)   Lab Results  Component Value Date   LDH 156 11/25/2007    No results found for: IRON, TIBC, IRONPCTSAT (Iron and TIBC)  No results found for: FERRITIN  Urinalysis    Component Value Date/Time   LABSPEC 1.015 03/13/2017 1905   PHURINE 7.0 03/13/2017 1905   GLUCOSEU NEGATIVE 03/13/2017 1905   HGBUR MODERATE (A) 03/13/2017 1905   BILIRUBINUR NEGATIVE 03/13/2017 1905   KETONESUR NEGATIVE 03/13/2017 1905   PROTEINUR NEGATIVE 03/13/2017 1905   UROBILINOGEN 0.2 03/13/2017 1905   NITRITE NEGATIVE 03/13/2017 1905   LEUKOCYTESUR MODERATE (A) 03/13/2017 1905    STUDIES: NM Sentinel Node Inj-No Rpt (Breast)  Result Date: 02/24/2021 Sulfur Colloid was injected by the Nuclear Medicine Technologist for sentinel lymph node localization.      ELIGIBLE FOR AVAILABLE RESEARCH PROTOCOL: no  ASSESSMENT: 50 y.o. [place] woman  (1) status post right breast upper outer  quadrant lumpectomy and sentinel lymph node sampling 04/15/2007 for a pT2 pN1, stage IIIB invasive ductal carcinoma, grade 3, triple negative, with an MIB-1 of 59%  (a) adjuvant chemotherapy consisted of doxorubicin and cyclophosphamide in dose dense fashion x4 followed by weekly paclitaxel x12  (b) received adjuvant radiation  (c) a total of 4 right axillary lymph nodes were removed  (2) status post right breast upper inner quadrant biopsy 09/22/2020 for a clinical T2 N2, stage IIIC invasive ductal carcinoma, grade 3, triple negative, with an MIB-1 of 80%.  (a) chest CT 10/28/2020  shows indeterminate 0.3 cm or smaller pulmonary nodules  (b) bone scan 10/29/2020 shows no evidence of metatstatic disease  (3) genetics testing 10/19/2020 through the Common hereditary Cancers panel offered by [facility] shows no deleterious mutations  (4) neoadjuvant chemotherapy started 10/14/2020 consisting of pembrolizumab given every 3 weeks, with carboplatin + paclitaxel given weekly, 3 weeks on, one week off, wityh 4 cycles planned (12 carbo/Taxol doses), last dose 12/17/2020  (a) final 4 cycles of chemotherapy omitted because of neuropathy  (b) breast MRI 12/24/2020 shows near complete resolution of malignancy  (5) status post right mastectomy and sentinel lymph node sampling 02/24/2021 showing a residual ypT1a ypN0 invasive ductal carcinoma, grade 3, with ample margins  (a) a single right axillary lymph node was removed  (6) to continue pembrolizumab to total one year (9 post-op doses)   PLAN:  Penny Brown did not show for her 03/17/2021 appointment and treatment.  We will call her to reschedule.  Penny Brown. Oral Hallgren, MD 03/17/21 11:07 AM Medical Oncology and Hematology [facility] [address] [place], [place] [address] Tel. [phone]    Fax. [phone]   I, Wilburn Mylar, am acting as scribe for Dr. Virgie Brown. Pooja Camuso.  I, Lurline Del MD, have reviewed the above  documentation for accuracy and completeness, and I agree with the above.   *Total Encounter Time as defined by the Centers for Medicare and Medicaid Services includes, in addition to the face-to-face time of a patient visit (documented in the note above) non-face-to-face time: obtaining and reviewing outside history, ordering and reviewing medications, tests or procedures, care coordination (communications with other health care professionals or caregivers) and documentation in the medical record.

## 2021-03-17 ENCOUNTER — Inpatient Hospital Stay: Payer: 59 | Attending: Oncology

## 2021-03-17 ENCOUNTER — Inpatient Hospital Stay (HOSPITAL_BASED_OUTPATIENT_CLINIC_OR_DEPARTMENT_OTHER): Payer: 59 | Admitting: Oncology

## 2021-03-17 ENCOUNTER — Inpatient Hospital Stay: Payer: 59

## 2021-03-17 DIAGNOSIS — Z171 Estrogen receptor negative status [ER-]: Secondary | ICD-10-CM | POA: Insufficient documentation

## 2021-03-17 DIAGNOSIS — G629 Polyneuropathy, unspecified: Secondary | ICD-10-CM | POA: Insufficient documentation

## 2021-03-17 DIAGNOSIS — C50411 Malignant neoplasm of upper-outer quadrant of right female breast: Secondary | ICD-10-CM

## 2021-03-17 DIAGNOSIS — Z803 Family history of malignant neoplasm of breast: Secondary | ICD-10-CM | POA: Insufficient documentation

## 2021-03-17 DIAGNOSIS — Z923 Personal history of irradiation: Secondary | ICD-10-CM | POA: Insufficient documentation

## 2021-03-17 DIAGNOSIS — Z5112 Encounter for antineoplastic immunotherapy: Secondary | ICD-10-CM | POA: Insufficient documentation

## 2021-03-17 DIAGNOSIS — Z79899 Other long term (current) drug therapy: Secondary | ICD-10-CM | POA: Insufficient documentation

## 2021-03-17 DIAGNOSIS — Z9011 Acquired absence of right breast and nipple: Secondary | ICD-10-CM | POA: Insufficient documentation

## 2021-03-17 DIAGNOSIS — C50211 Malignant neoplasm of upper-inner quadrant of right female breast: Secondary | ICD-10-CM

## 2021-03-18 ENCOUNTER — Encounter: Payer: Self-pay | Admitting: Rehabilitation

## 2021-03-21 ENCOUNTER — Other Ambulatory Visit: Payer: Self-pay | Admitting: Pharmacist

## 2021-03-22 ENCOUNTER — Other Ambulatory Visit: Payer: Self-pay

## 2021-03-22 ENCOUNTER — Ambulatory Visit: Payer: 59

## 2021-03-22 DIAGNOSIS — C50211 Malignant neoplasm of upper-inner quadrant of right female breast: Secondary | ICD-10-CM | POA: Diagnosis not present

## 2021-03-22 DIAGNOSIS — Z171 Estrogen receptor negative status [ER-]: Secondary | ICD-10-CM

## 2021-03-22 DIAGNOSIS — Z483 Aftercare following surgery for neoplasm: Secondary | ICD-10-CM

## 2021-03-22 DIAGNOSIS — R293 Abnormal posture: Secondary | ICD-10-CM

## 2021-03-22 DIAGNOSIS — M25611 Stiffness of right shoulder, not elsewhere classified: Secondary | ICD-10-CM

## 2021-03-22 NOTE — Therapy (Signed)
[place], [place], [address] Phone: [phone]   Fax:  [phone]  Physical Therapy Treatment  Patient Details  Name: Penny Brown MRN: [id number] Date of Birth: 04-02-1971 Referring Provider (PT): Dr. [name]   Encounter Date: 03/22/2021   PT End of Session - 03/22/21 1212     Visit Number 3    Number of Visits 10    Date for PT Re-Evaluation 04/11/21    PT Start Time 1108   pt arrived late   PT Stop Time 1204    PT Time Calculation (min) 56 min    Activity Tolerance Patient tolerated treatment well    Behavior During Therapy [facility] for tasks assessed/performed             Past Medical History:  Diagnosis Date   Breast cancer ([place]) 2008   Right Breast Cancer   Breast cancer ([place]) 09/2020   Right Breast   Cancer ([place])    Family history of breast cancer    Family history of prostate cancer    Personal history of chemotherapy 2008   Right Breast Cancer   Personal history of radiation therapy 2008   Right Breast Cancer    Past Surgical History:  Procedure Laterality Date   BREAST LUMPECTOMY Right 2008   CESAREAN SECTION     MASTECTOMY W/ SENTINEL NODE BIOPSY Right 02/24/2021   Procedure: RIGHT MASTECTOMY WITH RIGHT AXILLARY SENTINEL LYMPH NODE BIOPSY;  Surgeon: [name], MD;  Location: [place];  Service: General;  Laterality: Right;   PORTACATH PLACEMENT Left 10/13/2020   Procedure: INSERTION PORT-A-CATH;  Surgeon: [name], MD;  Location: [place];  Service: General;  Laterality: Left;  LEAVE PORT ACCESSED CHEMO STARTS ON 2/10, LEFT SIDE    There were no vitals filed for this visit.   Subjective Assessment - 03/22/21 1111     Subjective My Rt armpit and trunk just feel tight most days, I don't have pain. My incision has been slow to heal so that's been limiting my motion.    Pertinent History Patient was diagnosed on 09/22/2020 with  right triple negative breast cancer. It measures 2.2 cm and is located in the upper inner quadrant with a Ki67 of 80%. She underwent a right mastectomy and sentinel node biopsy (1 negative node) on 02/24/2021. She did neoadjuvant chemotherapy prior to surgery. She had previous right breast cancer in 2008 with a lumpectomy, sentinel node biopsy (pt believes they took 4 lymph nodes and 1 was positive), chemotherapy and radiation.    Patient Stated Goals See how my arm is doing    Currently in Pain? No/denies                               [facility] Adult PT Treatment/Exercise - 03/22/21 0001       Manual Therapy   Manual Therapy Myofascial release;Passive ROM;Soft tissue mobilization    Soft tissue mobilization Gently to Rt pectoralis insertion where pt palpably very tight.    Myofascial Release To Rt axilla when in end range positions; blocking at lateral/superior aspect of Rt chest wall to prevent any pulling at open incision    Passive ROM In Supine to Rt shoulder into flexion, abduction and er to pts tolerance which is limited for now due to muscle guarding and pectoralis tightness. Her end motions did improve well by end of session as did her  tightness.                         PT Long Term Goals - 03/14/21 1344       PT LONG TERM GOAL #1   Title Patient will demonstrate she has regained full shoulder ROM and function post operatively compared to baselines.    Time 4    Period Weeks    Status On-going    Target Date 04/11/21      PT LONG TERM GOAL #2   Title Patient will increase right shoulder active flexion to >/= 150 degrees for ability to reach overhead.    Baseline 84    Time 4    Period Weeks    Status New    Target Date 04/11/21      PT LONG TERM GOAL #3   Title Patient will increase right shoulder active abduction to >/= 150 degrees for ability to reach overhead.    Baseline 60    Time 4    Period Weeks    Status New    Target Date  04/11/21      PT LONG TERM GOAL #4   Title Patient will report good understanding of Lymphedema risk reduction practices.    Time 4    Period Weeks    Status New    Target Date 04/11/21      PT LONG TERM GOAL #5   Title Patient will improve her DASH score to be </= 10 for improved overall upper extremity function.    Baseline 47    Time 4    Period Weeks    Status New    Target Date 04/11/21                   Plan - 03/22/21 1213     Clinical Impression Statement First session today of treatment since evaluation. Pt very guarded initially and required multiple VCs to try to relax throughout session. Rt pectoralis was palpably very tight so gentle STM at insertion and this did soften some by end of session. Also her Rt shoudler end P/ROM was very limited initially but with continued stretching and cuing to relax, this alos improved some by end of session and pt reports feelinglooser in Rt upper quadrant by end of session. Blocking on Rt chest wall during stretching to prevent any pulling at open incision. Pt came in with a bandaid over area with gauze. Cautioned against this as adhesive was almost on recently healed incision and covered some of her steristrips so had to be mindful of removing this carefully to avoid any skin irritation. Gauze was stuck to some of healing granulated tissue so educated pt about no gauze for now either. Issued non-adhesive gauze and fixated this with paper tape on nonsurgical skin and advised pt she can get both at [facility] or [facility] and that this would be better for now than a bandaid and gauze. Pt verbalized understanding.    Stability/Clinical Decision Making Stable/Uncomplicated    Rehab Potential Excellent    PT Frequency 2x / week    PT Duration 4 weeks    PT Treatment/Interventions ADLs/Self Care Home Management;Therapeutic exercise;Patient/family education;Manual techniques;Manual lymph drainage;Passive range of motion;Scar mobilization    PT  Next Visit Plan Cont gentle PROM and manual therapy to decrease pectoralis tightness being mindful of healing incision; assess this; and begin AAROM exercises right shoulder    PT Home Exercise Plan Post op shoulder  ROM HEP    Consulted and Agree with Plan of Care Patient             Patient will benefit from skilled therapeutic intervention in order to improve the following deficits and impairments:  Postural dysfunction, Decreased knowledge of precautions, Impaired UE functional use, Pain, Decreased range of motion, Decreased scar mobility, Increased fascial restricitons, Decreased strength  Visit Diagnosis: Malignant neoplasm of upper-inner quadrant of right breast in female, estrogen receptor negative (HCC)  Abnormal posture  Aftercare following surgery for neoplasm  Stiffness of right shoulder, not elsewhere classified     Problem List Patient Active Problem List   Diagnosis Date Noted   S/P mastectomy, right 02/24/2021   Port-A-Cath in place 10/21/2020   Genetic testing 10/20/2020   Malignant neoplasm of upper-outer quadrant of right breast in female, estrogen receptor negative ([place]) 09/29/2020   Family history of breast cancer    Family history of prostate cancer    Malignant neoplasm of upper-inner quadrant of right breast in female, estrogen receptor negative ([place]) 09/27/2020    Otelia Limes, PTA 03/22/2021, 12:22 PM  [place], [place], [address] Phone: [phone]   Fax:  [phone]  Name: Penny Brown MRN: [id number] Date of Birth: [birth year]

## 2021-03-23 ENCOUNTER — Telehealth: Payer: Self-pay | Admitting: *Deleted

## 2021-03-23 NOTE — Telephone Encounter (Signed)
This RN left VM on verified phone number for pt per missed appt 7/14 with MD and infusion.  This RN's name and the office return call number given.

## 2021-03-24 ENCOUNTER — Other Ambulatory Visit: Payer: Self-pay

## 2021-03-24 ENCOUNTER — Ambulatory Visit: Payer: 59

## 2021-03-24 DIAGNOSIS — C50211 Malignant neoplasm of upper-inner quadrant of right female breast: Secondary | ICD-10-CM | POA: Diagnosis not present

## 2021-03-24 DIAGNOSIS — M25611 Stiffness of right shoulder, not elsewhere classified: Secondary | ICD-10-CM

## 2021-03-24 DIAGNOSIS — Z483 Aftercare following surgery for neoplasm: Secondary | ICD-10-CM

## 2021-03-24 DIAGNOSIS — Z171 Estrogen receptor negative status [ER-]: Secondary | ICD-10-CM

## 2021-03-24 DIAGNOSIS — R293 Abnormal posture: Secondary | ICD-10-CM

## 2021-03-24 NOTE — Therapy (Signed)
[place], [place], [address] Phone: [phone]   Fax:  [phone]  Physical Therapy Treatment  Patient Details  Name: Penny Brown MRN: [id number] Date of Birth: February 04, 1971 Referring Provider (PT): Dr. [name]   Encounter Date: 03/24/2021   PT End of Session - 03/24/21 1206     Visit Number 4    Number of Visits 10    Date for PT Re-Evaluation 04/11/21    PT Start Time 1107    PT Stop Time 1206    PT Time Calculation (min) 59 min    Activity Tolerance Patient tolerated treatment well    Behavior During Therapy [facility] for tasks assessed/performed             Past Medical History:  Diagnosis Date   Breast cancer ([place]) 2008   Right Breast Cancer   Breast cancer ([place]) 09/2020   Right Breast   Cancer ([place])    Family history of breast cancer    Family history of prostate cancer    Personal history of chemotherapy 2008   Right Breast Cancer   Personal history of radiation therapy 2008   Right Breast Cancer    Past Surgical History:  Procedure Laterality Date   BREAST LUMPECTOMY Right 2008   CESAREAN SECTION     MASTECTOMY W/ SENTINEL NODE BIOPSY Right 02/24/2021   Procedure: RIGHT MASTECTOMY WITH RIGHT AXILLARY SENTINEL LYMPH NODE BIOPSY;  Surgeon: [name], MD;  Location: [place];  Service: General;  Laterality: Right;   PORTACATH PLACEMENT Left 10/13/2020   Procedure: INSERTION PORT-A-CATH;  Surgeon: [name], MD;  Location: [place];  Service: General;  Laterality: Left;  LEAVE PORT ACCESSED CHEMO STARTS ON 2/10, LEFT SIDE    There were no vitals filed for this visit.   Subjective Assessment - 03/24/21 1110     Subjective I feel better today! And my incision is looking better. I see Dr. Donne Hazel tomorrow.    Pertinent History Patient was diagnosed on 09/22/2020 with right triple negative breast cancer. It measures 2.2 cm and is located  in the upper inner quadrant with a Ki67 of 80%. She underwent a right mastectomy and sentinel node biopsy (1 negative node) on 02/24/2021. She did neoadjuvant chemotherapy prior to surgery. She had previous right breast cancer in 2008 with a lumpectomy, sentinel node biopsy (pt believes they took 4 lymph nodes and 1 was positive), chemotherapy and radiation.    Patient Stated Goals See how my arm is doing    Currently in Pain? No/denies                               [facility] Adult PT Treatment/Exercise - 03/24/21 0001       Shoulder Exercises: Pulleys   Flexion 2 minutes    Flexion Limitations Pt returned therapist demo and VCs to decrease Rt scapular compensation    Scaption 2 minutes    Scaption Limitations Pt returned therapist demo and VCs to decrease Rt scapular compensation      Shoulder Exercises: Therapy Ball   Flexion Both;5 reps   forward lean into end of stretch   Flexion Limitations pt with very slow, guarded ROM; VCs to relax shoulder throughout      Shoulder Exercises: Stretch   Wall Stretch - ABduction 5 reps   Rt shoulder with finger ladder   Wall Stretch - ABduction Limitations Pt  returned therapist demo; VCs throughout to relax Rt shoulder      Manual Therapy   Manual Therapy Myofascial release;Passive ROM;Soft tissue mobilization;Scapular mobilization    Soft tissue mobilization Gently to Rt pectoralis insertion where pt palpably very tight, also when in Lt S/L along Rt medial scapular border tight Rt upper traps for trigger point release    Myofascial Release To Rt axilla when in end range positions; blocking at lateral/superior aspect of Rt chest wall to prevent any pulling at open incision    Scapular Mobilization In Lt S/L to Rt scapula into protraction and retraction; scapular depresion thorughout shoulder P/ROM when in supine    Passive ROM In Supine to Rt shoulder into flexion, abduction and D2 to pts tolerance which was some improved today from  last session and pt required slightly less cuing to relax.                         PT Long Term Goals - 03/14/21 1344       PT LONG TERM GOAL #1   Title Patient will demonstrate she has regained full shoulder ROM and function post operatively compared to baselines.    Time 4    Period Weeks    Status On-going    Target Date 04/11/21      PT LONG TERM GOAL #2   Title Patient will increase right shoulder active flexion to >/= 150 degrees for ability to reach overhead.    Baseline 84    Time 4    Period Weeks    Status New    Target Date 04/11/21      PT LONG TERM GOAL #3   Title Patient will increase right shoulder active abduction to >/= 150 degrees for ability to reach overhead.    Baseline 60    Time 4    Period Weeks    Status New    Target Date 04/11/21      PT LONG TERM GOAL #4   Title Patient will report good understanding of Lymphedema risk reduction practices.    Time 4    Period Weeks    Status New    Target Date 04/11/21      PT LONG TERM GOAL #5   Title Patient will improve her DASH score to be </= 10 for improved overall upper extremity function.    Baseline 47    Time 4    Period Weeks    Status New    Target Date 04/11/21                   Plan - 03/24/21 1222     Clinical Impression Statement Pt comes in wearing new non adherent gauze fixated with paper tape over healing incision. Removed this to assess and it has drainage on it so left open to air out during manual therapy today then repaplied clean non adherent gauze after session with paper tape. Tried to see if large scab would pull loose by gently pulling but this is still attached so did not remove. Pt sees Dr. Donne Hazel tomorrow for further assess of this. Continued with gentle P/ROM of Rt shoulder to pts toelrance, which did improve greatly today. She does still require cuing to relax during session due to muscle guarding but this is much improved since last session. Also  progressed pt to include gentle AA/ROM with pulleys, ball roll up wall and finger ladder with VCs to stop  if she pulls at inferior incision. Pts end P/ROM is improving with pectoralis some softened today from last session. Scapular mobility alos improved marginally after scap mobs.    Stability/Clinical Decision Making Stable/Uncomplicated    Rehab Potential Excellent    PT Frequency 2x / week    PT Duration 4 weeks    PT Treatment/Interventions ADLs/Self Care Home Management;Therapeutic exercise;Patient/family education;Manual techniques;Manual lymph drainage;Passive range of motion;Scar mobilization    PT Next Visit Plan How was visit with Dr. Donne Hazel? Cont gentle PROM and manual therapy to decrease pectoralis tightness being mindful of healing incision; assess this; and cont AAROM exercises right shoulder; progress to supine scapular series once incision is healed    PT Home Exercise Plan Post op shoulder ROM HEP    Consulted and Agree with Plan of Care Patient             Patient will benefit from skilled therapeutic intervention in order to improve the following deficits and impairments:  Postural dysfunction, Decreased knowledge of precautions, Impaired UE functional use, Pain, Decreased range of motion, Decreased scar mobility, Increased fascial restricitons, Decreased strength  Visit Diagnosis: Malignant neoplasm of upper-inner quadrant of right breast in female, estrogen receptor negative ([place])  Abnormal posture  Aftercare following surgery for neoplasm  Stiffness of right shoulder, not elsewhere classified     Problem List Patient Active Problem List   Diagnosis Date Noted   S/P mastectomy, right 02/24/2021   Port-A-Cath in place 10/21/2020   Genetic testing 10/20/2020   Malignant neoplasm of upper-outer quadrant of right breast in female, estrogen receptor negative ([place]) 09/29/2020   Family history of breast cancer    Family history of prostate cancer    Malignant  neoplasm of upper-inner quadrant of right breast in female, estrogen receptor negative ([place]) 09/27/2020    Otelia Limes, PTA 03/24/2021, 12:31 PM  [place], [place], [address] Phone: [phone]   Fax:  [phone]  Name: Penny Brown MRN: [id number] Date of Birth: June 19, 1971

## 2021-03-28 ENCOUNTER — Telehealth: Payer: Self-pay | Admitting: Oncology

## 2021-03-28 NOTE — Telephone Encounter (Signed)
Scheduled appointment per 07/25 sch msg. Patient is aware. 

## 2021-03-30 ENCOUNTER — Encounter: Payer: Self-pay | Admitting: Rehabilitation

## 2021-03-31 ENCOUNTER — Inpatient Hospital Stay: Payer: 59

## 2021-03-31 ENCOUNTER — Other Ambulatory Visit: Payer: Self-pay

## 2021-03-31 VITALS — BP 131/80 | HR 93 | Temp 98.1°F | Resp 16 | Ht 63.0 in | Wt 163.8 lb

## 2021-03-31 DIAGNOSIS — G629 Polyneuropathy, unspecified: Secondary | ICD-10-CM | POA: Diagnosis not present

## 2021-03-31 DIAGNOSIS — Z5112 Encounter for antineoplastic immunotherapy: Secondary | ICD-10-CM | POA: Diagnosis not present

## 2021-03-31 DIAGNOSIS — Z95828 Presence of other vascular implants and grafts: Secondary | ICD-10-CM

## 2021-03-31 DIAGNOSIS — Z171 Estrogen receptor negative status [ER-]: Secondary | ICD-10-CM

## 2021-03-31 DIAGNOSIS — Z923 Personal history of irradiation: Secondary | ICD-10-CM | POA: Diagnosis not present

## 2021-03-31 DIAGNOSIS — C50211 Malignant neoplasm of upper-inner quadrant of right female breast: Secondary | ICD-10-CM

## 2021-03-31 DIAGNOSIS — Z803 Family history of malignant neoplasm of breast: Secondary | ICD-10-CM | POA: Diagnosis not present

## 2021-03-31 DIAGNOSIS — C50411 Malignant neoplasm of upper-outer quadrant of right female breast: Secondary | ICD-10-CM | POA: Diagnosis not present

## 2021-03-31 DIAGNOSIS — Z79899 Other long term (current) drug therapy: Secondary | ICD-10-CM | POA: Diagnosis not present

## 2021-03-31 DIAGNOSIS — Z9011 Acquired absence of right breast and nipple: Secondary | ICD-10-CM | POA: Diagnosis not present

## 2021-03-31 LAB — COMPREHENSIVE METABOLIC PANEL
ALT: 12 U/L (ref 0–44)
AST: 16 U/L (ref 15–41)
Albumin: 3.5 g/dL (ref 3.5–5.0)
Alkaline Phosphatase: 98 U/L (ref 38–126)
Anion gap: 9 (ref 5–15)
BUN: 11 mg/dL (ref 6–20)
CO2: 23 mmol/L (ref 22–32)
Calcium: 9.5 mg/dL (ref 8.9–10.3)
Chloride: 108 mmol/L (ref 98–111)
Creatinine, Ser: 0.81 mg/dL (ref 0.44–1.00)
GFR, Estimated: >60 mL/min (ref >60)
Glucose, Bld: 102 mg/dL — ABNORMAL HIGH (ref 70–99)
Potassium: 4.3 mmol/L (ref 3.5–5.1)
Sodium: 140 mmol/L (ref 135–145)
Total Bilirubin: <0.2 mg/dL — ABNORMAL LOW (ref 0.3–1.2)
Total Protein: 7.5 g/dL (ref 6.5–8.1)

## 2021-03-31 LAB — CBC WITH DIFFERENTIAL/PLATELET
Abs Immature Granulocytes: 0.02 10*3/uL (ref 0.00–0.07)
Basophils Absolute: 0 10*3/uL (ref 0.0–0.1)
Basophils Relative: 0 %
Eosinophils Absolute: 0.3 10*3/uL (ref 0.0–0.5)
Eosinophils Relative: 7 %
HCT: 34 % — ABNORMAL LOW (ref 36.0–46.0)
Hemoglobin: 10.7 g/dL — ABNORMAL LOW (ref 12.0–15.0)
Immature Granulocytes: 0 %
Lymphocytes Relative: 32 %
Lymphs Abs: 1.6 10*3/uL (ref 0.7–4.0)
MCH: 29.4 pg (ref 26.0–34.0)
MCHC: 31.5 g/dL (ref 30.0–36.0)
MCV: 93.4 fL (ref 80.0–100.0)
Monocytes Absolute: 0.3 10*3/uL (ref 0.1–1.0)
Monocytes Relative: 6 %
Neutro Abs: 2.8 10*3/uL (ref 1.7–7.7)
Neutrophils Relative %: 55 %
Platelets: 205 10*3/uL (ref 150–400)
RBC: 3.64 MIL/uL — ABNORMAL LOW (ref 3.87–5.11)
RDW: 13.1 % (ref 11.5–15.5)
WBC: 5 10*3/uL (ref 4.0–10.5)
nRBC: 0 % (ref 0.0–0.2)

## 2021-03-31 MED ORDER — ALTEPLASE 2 MG IJ SOLR
2.0000 mg | Freq: Once | INTRAMUSCULAR | Status: AC
  Administered 2021-03-31: 2 mg
  Filled 2021-03-31: qty 2

## 2021-03-31 MED ORDER — HEPARIN SOD (PORK) LOCK FLUSH 100 UNIT/ML IV SOLN
500.0000 [IU] | Freq: Once | INTRAVENOUS | Status: AC | PRN
  Administered 2021-03-31: 500 [IU]
  Filled 2021-03-31: qty 5

## 2021-03-31 MED ORDER — SODIUM CHLORIDE 0.9% FLUSH
10.0000 mL | Freq: Once | INTRAVENOUS | Status: AC
Start: 2021-03-31 — End: 2021-03-31
  Administered 2021-03-31: 10 mL
  Filled 2021-03-31: qty 10

## 2021-03-31 MED ORDER — SODIUM CHLORIDE 0.9% FLUSH
10.0000 mL | INTRAVENOUS | Status: DC | PRN
Start: 2021-03-31 — End: 2021-03-31
  Administered 2021-03-31: 10 mL
  Filled 2021-03-31: qty 10

## 2021-03-31 MED ORDER — SODIUM CHLORIDE 0.9 % IV SOLN
200.0000 mg | Freq: Once | INTRAVENOUS | Status: AC
  Administered 2021-03-31: 200 mg via INTRAVENOUS
  Filled 2021-03-31: qty 8

## 2021-03-31 MED ORDER — SODIUM CHLORIDE 0.9 % IV SOLN
Freq: Once | INTRAVENOUS | Status: AC
  Filled 2021-03-31: qty 250

## 2021-03-31 MED ORDER — ALTEPLASE 2 MG IJ SOLR
INTRAMUSCULAR | Status: AC
  Filled 2021-03-31: qty 2

## 2021-03-31 NOTE — Patient Instructions (Signed)
[facility] MEDICAL ONCOLOGY  Discharge Instructions: ?Thank you for choosing [facility] to provide your oncology and hematology care.  ? ?If you have a lab appointment with the Cancer Center, please go directly to the Cancer Center and check in at the registration area. ?  ?Wear comfortable clothing and clothing appropriate for easy access to any Portacath or PICC line.  ? ?We strive to give you quality time with your provider. You may need to reschedule your appointment if you arrive late (15 or more minutes).  Arriving late affects you and other patients whose appointments are after yours.  Also, if you miss three or more appointments without notifying the office, you may be dismissed from the clinic at the provider?s discretion.    ?  ?For prescription refill requests, have your pharmacy contact our office and allow 72 hours for refills to be completed.   ? ?Today you received the following chemotherapy and/or immunotherapy agents: Keytruda ?  ?To help prevent nausea and vomiting after your treatment, we encourage you to take your nausea medication as directed. ? ?BELOW ARE SYMPTOMS THAT SHOULD BE REPORTED IMMEDIATELY: ?*FEVER GREATER THAN 100.4 F (38 ?C) OR HIGHER ?*CHILLS OR SWEATING ?*NAUSEA AND VOMITING THAT IS NOT CONTROLLED WITH YOUR NAUSEA MEDICATION ?*UNUSUAL SHORTNESS OF BREATH ?*UNUSUAL BRUISING OR BLEEDING ?*URINARY PROBLEMS (pain or burning when urinating, or frequent urination) ?*BOWEL PROBLEMS (unusual diarrhea, constipation, pain near the anus) ?TENDERNESS IN MOUTH AND THROAT WITH OR WITHOUT PRESENCE OF ULCERS (sore throat, sores in mouth, or a toothache) ?UNUSUAL RASH, SWELLING OR PAIN  ?UNUSUAL VAGINAL DISCHARGE OR ITCHING  ? ?Items with * indicate a potential emergency and should be followed up as soon as possible or go to the Emergency Department if any problems should occur. ? ?Please show the CHEMOTHERAPY ALERT CARD or IMMUNOTHERAPY ALERT CARD at check-in to the  Emergency Department and triage nurse. ? ?Should you have questions after your visit or need to cancel or reschedule your appointment, please contact [facility]  Dept: [phone]  and follow the prompts.  Office hours are 8:00 a.m. to 4:30 p.m. Monday - Friday. Please note that voicemails left after 4:00 p.m. may not be returned until the following business day.  We are closed weekends and major holidays. You have access to a nurse at all times for urgent questions. Please call the main number to the clinic Dept: [phone] and follow the prompts. ? ? ?For any non-urgent questions, you may also contact your provider using MyChart. We now offer e-Visits for anyone 18 and older to request care online for non-urgent symptoms. For details visit [email or web address]. ?  ?Also download the MyChart app! Go to the app store, search "MyChart", open the app, select [facility], and log in with your MyChart username and password. ? ?Due to Covid, a mask is required upon entering the hospital/clinic. If you do not have a mask, one will be given to you upon arrival. For doctor visits, patients may have 1 support person aged 18 or older with them. For treatment visits, patients cannot have anyone with them due to current Covid guidelines and our immunocompromised population.  ? ?

## 2021-04-04 ENCOUNTER — Ambulatory Visit: Payer: 59 | Attending: General Surgery

## 2021-04-04 ENCOUNTER — Other Ambulatory Visit: Payer: Self-pay

## 2021-04-04 DIAGNOSIS — Z483 Aftercare following surgery for neoplasm: Secondary | ICD-10-CM

## 2021-04-04 DIAGNOSIS — C50211 Malignant neoplasm of upper-inner quadrant of right female breast: Secondary | ICD-10-CM | POA: Diagnosis not present

## 2021-04-04 DIAGNOSIS — M25611 Stiffness of right shoulder, not elsewhere classified: Secondary | ICD-10-CM | POA: Insufficient documentation

## 2021-04-04 DIAGNOSIS — Z171 Estrogen receptor negative status [ER-]: Secondary | ICD-10-CM

## 2021-04-04 DIAGNOSIS — R293 Abnormal posture: Secondary | ICD-10-CM

## 2021-04-04 NOTE — Therapy (Signed)
[place], [place], [address] Phone: [phone]   Fax:  [phone]  Physical Therapy Treatment  Patient Details  Name: Penny Brown MRN: [id number] Date of Birth: [birth year] Referring Provider (PT): Dr. [name]   Encounter Date: 04/04/2021   PT End of Session - 04/04/21 1727     Visit Number 5    Number of Visits 10    Date for PT Re-Evaluation 04/11/21    PT Start Time 7062   pt arrived late   PT Stop Time 1706    PT Time Calculation (min) 53 min    Activity Tolerance Patient tolerated treatment well    Behavior During Therapy [facility] for tasks assessed/performed             Past Medical History:  Diagnosis Date   Breast cancer ([place]) 2008   Right Breast Cancer   Breast cancer ([place]) 09/2020   Right Breast   Cancer ([place])    Family history of breast cancer    Family history of prostate cancer    Personal history of chemotherapy 2008   Right Breast Cancer   Personal history of radiation therapy 2008   Right Breast Cancer    Past Surgical History:  Procedure Laterality Date   BREAST LUMPECTOMY Right 2008   CESAREAN SECTION     MASTECTOMY W/ SENTINEL NODE BIOPSY Right 02/24/2021   Procedure: RIGHT MASTECTOMY WITH RIGHT AXILLARY SENTINEL LYMPH NODE BIOPSY;  Surgeon: [name], MD;  Location: [facility];  Service: General;  Laterality: Right;   PORTACATH PLACEMENT Left 10/13/2020   Procedure: INSERTION PORT-A-CATH;  Surgeon: [name], MD;  Location: [facility];  Service: General;  Laterality: Left;  LEAVE PORT ACCESSED CHEMO STARTS ON 2/10, LEFT SIDE    There were no vitals filed for this visit.   Subjective Assessment - 04/04/21 1615     Subjective I saw Dr. Donne Hazel and he said it is starting to heal well. I can use the gauze now with neosporin because my skin has healed over some and the neosporin keeps it from sticking.    Pertinent History  Patient was diagnosed on 09/22/2020 with right triple negative breast cancer. It measures 2.2 cm and is located in the upper inner quadrant with a Ki67 of 80%. She underwent a right mastectomy and sentinel node biopsy (1 negative node) on 02/24/2021. She did neoadjuvant chemotherapy prior to surgery. She had previous right breast cancer in 2008 with a lumpectomy, sentinel node biopsy (pt believes they took 4 lymph nodes and 1 was positive), chemotherapy and radiation.    Patient Stated Goals See how my arm is doing    Currently in Pain? No/denies                               [facility] Adult PT Treatment/Exercise - 04/04/21 0001       Shoulder Exercises: Pulleys   Flexion 2 minutes    Flexion Limitations VCs for pt to relax    ABduction 2 minutes    ABduction Limitations VCs to relax shoulder but pt able to do abduction today      Manual Therapy   Manual Therapy Myofascial release;Passive ROM;Soft tissue mobilization;Scapular mobilization;Manual Lymphatic Drainage (MLD)    Soft tissue mobilization Gently to Rt pectoralis insertion where pt palpably very tight, then with cocoa butter when in Lt S/L along Rt medial scapular border which  had less tightness today than last session, briefly to upper traps as well    Myofascial Release To Rt axilla when in end range positions; blocking at lateral/superior aspect of Rt chest wall to prevent any pulling at open incision    Scapular Mobilization In Lt S/L to Rt scapula into protraction and retraction; scapular depresion thorughout shoulder P/ROM when in supine    Manual Lymphatic Drainage (MLD) In Supine: Rt inguinal nodes, Rt axillo-inguinal anastomosis then focused on Rt pectoralis area where fibrosis palpable avoiding inferior, still healing incision.    Passive ROM In Supine to Rt shoulder into flexion, abduction and D2 to pts tolerance which was some improved today from last session and pt required slightly less cuing to relax.                          PT Long Term Goals - 03/14/21 1344       PT LONG TERM GOAL #1   Title Patient will demonstrate she has regained full shoulder ROM and function post operatively compared to baselines.    Time 4    Period Weeks    Status On-going    Target Date 04/11/21      PT LONG TERM GOAL #2   Title Patient will increase right shoulder active flexion to >/= 150 degrees for ability to reach overhead.    Baseline 84    Time 4    Period Weeks    Status New    Target Date 04/11/21      PT LONG TERM GOAL #3   Title Patient will increase right shoulder active abduction to >/= 150 degrees for ability to reach overhead.    Baseline 60    Time 4    Period Weeks    Status New    Target Date 04/11/21      PT LONG TERM GOAL #4   Title Patient will report good understanding of Lymphedema risk reduction practices.    Time 4    Period Weeks    Status New    Target Date 04/11/21      PT LONG TERM GOAL #5   Title Patient will improve her DASH score to be </= 10 for improved overall upper extremity function.    Baseline 47    Time 4    Period Weeks    Status New    Target Date 04/11/21                   Plan - 04/04/21 1728     Clinical Impression Statement Assessed still healing incision at inferior Rt mastectomy incision. Area does appear smaller than at last session and she repotrs Dr. Donne Hazel instructed her to begin using neosporin on area and gauze as the ointment will prevent gauze from sticking to healing tissue. Was mindful of preventing pull to this area with manual therapy today. Pts end P/ROM was mcuh improved by end of session and her AA/ROM was improved as well with pulleys as she was able to relax some better than last session.    Stability/Clinical Decision Making Stable/Uncomplicated    Rehab Potential Excellent    PT Frequency 2x / week    PT Duration 4 weeks    PT Treatment/Interventions ADLs/Self Care Home Management;Therapeutic  exercise;Patient/family education;Manual techniques;Manual lymph drainage;Passive range of motion;Scar mobilization    PT Next Visit Plan Cont gentle PROM and manual therapy to decrease pectoralis tightness being mindful of  healing incision; and cont AAROM exercises right shoulder; progress to supine scapular series once incision is healed    PT Home Exercise Plan Post op shoulder ROM HEP    Consulted and Agree with Plan of Care Patient             Patient will benefit from skilled therapeutic intervention in order to improve the following deficits and impairments:  Postural dysfunction, Decreased knowledge of precautions, Impaired UE functional use, Pain, Decreased range of motion, Decreased scar mobility, Increased fascial restricitons, Decreased strength  Visit Diagnosis: Malignant neoplasm of upper-inner quadrant of right breast in female, estrogen receptor negative ([place])  Abnormal posture  Aftercare following surgery for neoplasm  Stiffness of right shoulder, not elsewhere classified     Problem List Patient Active Problem List   Diagnosis Date Noted   S/P mastectomy, right 02/24/2021   Port-A-Cath in place 10/21/2020   Genetic testing 10/20/2020   Malignant neoplasm of upper-outer quadrant of right breast in female, estrogen receptor negative ([place]) 09/29/2020   Family history of breast cancer    Family history of prostate cancer    Malignant neoplasm of upper-inner quadrant of right breast in female, estrogen receptor negative ([place]) 09/27/2020    Otelia Limes, PTA 04/04/2021, 5:31 PM  [place] [facility] [place], [place], [address] Phone: [phone]   Fax:  [phone]  Name: LADAWN BOULLION MRN: [id number] Date of Birth: Sep 22, 1970

## 2021-04-05 ENCOUNTER — Encounter: Payer: Self-pay | Admitting: Physical Therapy

## 2021-04-07 ENCOUNTER — Ambulatory Visit: Payer: 59

## 2021-04-07 ENCOUNTER — Other Ambulatory Visit: Payer: 59

## 2021-04-21 ENCOUNTER — Other Ambulatory Visit: Payer: Self-pay

## 2021-04-21 ENCOUNTER — Inpatient Hospital Stay: Payer: 59

## 2021-04-21 ENCOUNTER — Other Ambulatory Visit: Payer: Self-pay | Admitting: Oncology

## 2021-04-21 ENCOUNTER — Inpatient Hospital Stay: Payer: 59 | Attending: Adult Health

## 2021-04-21 VITALS — BP 132/85 | HR 88 | Temp 98.3°F | Resp 19 | Wt 164.5 lb

## 2021-04-21 DIAGNOSIS — C50211 Malignant neoplasm of upper-inner quadrant of right female breast: Secondary | ICD-10-CM | POA: Insufficient documentation

## 2021-04-21 DIAGNOSIS — Z171 Estrogen receptor negative status [ER-]: Secondary | ICD-10-CM | POA: Diagnosis not present

## 2021-04-21 DIAGNOSIS — Z95828 Presence of other vascular implants and grafts: Secondary | ICD-10-CM

## 2021-04-21 DIAGNOSIS — Z5112 Encounter for antineoplastic immunotherapy: Secondary | ICD-10-CM | POA: Diagnosis present

## 2021-04-21 DIAGNOSIS — C50411 Malignant neoplasm of upper-outer quadrant of right female breast: Secondary | ICD-10-CM

## 2021-04-21 LAB — CBC WITH DIFFERENTIAL/PLATELET
Abs Immature Granulocytes: 0.03 10*3/uL (ref 0.00–0.07)
Basophils Absolute: 0 10*3/uL (ref 0.0–0.1)
Basophils Relative: 1 %
Eosinophils Absolute: 0.3 10*3/uL (ref 0.0–0.5)
Eosinophils Relative: 4 %
HCT: 33.3 % — ABNORMAL LOW (ref 36.0–46.0)
Hemoglobin: 10.7 g/dL — ABNORMAL LOW (ref 12.0–15.0)
Immature Granulocytes: 1 %
Lymphocytes Relative: 37 %
Lymphs Abs: 2.3 10*3/uL (ref 0.7–4.0)
MCH: 28.5 pg (ref 26.0–34.0)
MCHC: 32.1 g/dL (ref 30.0–36.0)
MCV: 88.8 fL (ref 80.0–100.0)
Monocytes Absolute: 0.4 10*3/uL (ref 0.1–1.0)
Monocytes Relative: 7 %
Neutro Abs: 3.2 10*3/uL (ref 1.7–7.7)
Neutrophils Relative %: 50 %
Platelets: 263 10*3/uL (ref 150–400)
RBC: 3.75 MIL/uL — ABNORMAL LOW (ref 3.87–5.11)
RDW: 13.2 % (ref 11.5–15.5)
WBC: 6.2 10*3/uL (ref 4.0–10.5)
nRBC: 0 % (ref 0.0–0.2)

## 2021-04-21 LAB — COMPREHENSIVE METABOLIC PANEL
ALT: 13 U/L (ref 0–44)
AST: 17 U/L (ref 15–41)
Albumin: 3.8 g/dL (ref 3.5–5.0)
Alkaline Phosphatase: 96 U/L (ref 38–126)
Anion gap: 7 (ref 5–15)
BUN: 8 mg/dL (ref 6–20)
CO2: 26 mmol/L (ref 22–32)
Calcium: 9.3 mg/dL (ref 8.9–10.3)
Chloride: 105 mmol/L (ref 98–111)
Creatinine, Ser: 0.78 mg/dL (ref 0.44–1.00)
GFR, Estimated: >60 mL/min (ref >60)
Glucose, Bld: 79 mg/dL (ref 70–99)
Potassium: 3.8 mmol/L (ref 3.5–5.1)
Sodium: 138 mmol/L (ref 135–145)
Total Bilirubin: 0.2 mg/dL — ABNORMAL LOW (ref 0.3–1.2)
Total Protein: 7.5 g/dL (ref 6.5–8.1)

## 2021-04-21 MED ORDER — SODIUM CHLORIDE 0.9% FLUSH
10.0000 mL | Freq: Once | INTRAVENOUS | Status: AC
  Administered 2021-04-21: 10 mL

## 2021-04-21 MED ORDER — SODIUM CHLORIDE 0.9 % IV SOLN
200.0000 mg | Freq: Once | INTRAVENOUS | Status: AC
  Administered 2021-04-21: 200 mg via INTRAVENOUS
  Filled 2021-04-21: qty 8

## 2021-04-21 MED ORDER — SODIUM CHLORIDE 0.9% FLUSH
10.0000 mL | INTRAVENOUS | Status: DC | PRN
  Administered 2021-04-21: 10 mL

## 2021-04-21 MED ORDER — HEPARIN SOD (PORK) LOCK FLUSH 100 UNIT/ML IV SOLN
500.0000 [IU] | Freq: Once | INTRAVENOUS | Status: AC | PRN
  Administered 2021-04-21: 500 [IU]

## 2021-04-21 MED ORDER — SODIUM CHLORIDE 0.9 % IV SOLN: Freq: Once | INTRAVENOUS | Status: AC

## 2021-04-21 NOTE — Patient Instructions (Signed)
[facility]  Discharge Instructions: °Thank you for choosing [facility] to provide your oncology and hematology care.  ° °If you have a lab appointment with the Cancer Center, please go directly to the Cancer Center and check in at the registration area. °  °Wear comfortable clothing and clothing appropriate for easy access to any Portacath or PICC line.  ° °We strive to give you quality time with your provider. You may need to reschedule your appointment if you arrive late (15 or more minutes).  Arriving late affects you and other patients whose appointments are after yours.  Also, if you miss three or more appointments without notifying the office, you may be dismissed from the clinic at the provider’s discretion.    °  °For prescription refill requests, have your pharmacy contact our office and allow 72 hours for refills to be completed.   ° °Today you received the following chemotherapy and/or immunotherapy agent: Pembrolizumab (Keytruda) °  °To help prevent nausea and vomiting after your treatment, we encourage you to take your nausea medication as directed. ° °BELOW ARE SYMPTOMS THAT SHOULD BE REPORTED IMMEDIATELY: °*FEVER GREATER THAN 100.4 F (38 °C) OR HIGHER °*CHILLS OR SWEATING °*NAUSEA AND VOMITING THAT IS NOT CONTROLLED WITH YOUR NAUSEA MEDICATION °*UNUSUAL SHORTNESS OF BREATH °*UNUSUAL BRUISING OR BLEEDING °*URINARY PROBLEMS (pain or burning when urinating, or frequent urination) °*BOWEL PROBLEMS (unusual diarrhea, constipation, pain near the anus) °TENDERNESS IN MOUTH AND THROAT WITH OR WITHOUT PRESENCE OF ULCERS (sore throat, sores in mouth, or a toothache) °UNUSUAL RASH, SWELLING OR PAIN  °UNUSUAL VAGINAL DISCHARGE OR ITCHING  ° °Items with * indicate a potential emergency and should be followed up as soon as possible or go to the Emergency Department if any problems should occur. ° °Please show the CHEMOTHERAPY ALERT CARD or IMMUNOTHERAPY ALERT CARD at  check-in to the Emergency Department and triage nurse. ° °Should you have questions after your visit or need to cancel or reschedule your appointment, please contact [facility]  Dept: [phone]  and follow the prompts.  Office hours are 8:00 a.m. to 4:30 p.m. Monday - Friday. Please note that voicemails left after 4:00 p.m. may not be returned until the following business day.  We are closed weekends and major holidays. You have access to a nurse at all times for urgent questions. Please call the main number to the clinic Dept: [phone] and follow the prompts. ° ° °For any non-urgent questions, you may also contact your provider using MyChart. We now offer e-Visits for anyone 18 and older to request care online for non-urgent symptoms. For details visit [email or web address]. °  °Also download the MyChart app! Go to the app store, search "MyChart", open the app, select [facility], and log in with your MyChart username and password. ° °Due to Covid, a mask is required upon entering the hospital/clinic. If you do not have a mask, one will be given to you upon arrival. For doctor visits, patients may have 1 support person aged 18 or older with them. For treatment visits, patients cannot have anyone with them due to current Covid guidelines and our immunocompromised population.  ° °

## 2021-04-27 ENCOUNTER — Encounter: Payer: Self-pay | Admitting: *Deleted

## 2021-04-28 ENCOUNTER — Ambulatory Visit: Payer: 59

## 2021-04-28 ENCOUNTER — Ambulatory Visit: Payer: 59 | Admitting: Oncology

## 2021-04-28 ENCOUNTER — Other Ambulatory Visit: Payer: 59

## 2021-05-05 ENCOUNTER — Telehealth: Payer: Self-pay | Admitting: *Deleted

## 2021-05-05 HISTORY — PX: MASTECTOMY: SHX3

## 2021-05-05 NOTE — Telephone Encounter (Signed)
This RN spoke with pt per her call stating onset of mild intermittent cough now becoming more prominent especially when she reclines.  She denies fever but does have nasal congestion/drainage.  She has not done a Covid test.  She is concerned due to cough being a known possible side effect from the [place].  This RN verified above could be related but also recommended she proceed with Covid testing for best recommendations-   She should let us know - she may benefit from antiviral medication if positive.  If negative we may need to obtain a CXR for evaluation.  Pt is scheduled for lab visit with [facility]/NP and treatment next week.  This RN verified with pt use of OTC cough meds such as Robitussin DM is ok to use.

## 2021-05-12 ENCOUNTER — Ambulatory Visit: Payer: 59

## 2021-05-12 ENCOUNTER — Other Ambulatory Visit: Payer: 59

## 2021-05-12 ENCOUNTER — Encounter: Payer: Self-pay | Admitting: *Deleted

## 2021-05-12 ENCOUNTER — Ambulatory Visit: Payer: 59 | Admitting: Adult Health

## 2021-05-19 ENCOUNTER — Other Ambulatory Visit: Payer: 59

## 2021-05-19 ENCOUNTER — Ambulatory Visit: Payer: 59

## 2021-05-26 ENCOUNTER — Inpatient Hospital Stay: Payer: 59 | Attending: Oncology

## 2021-05-26 ENCOUNTER — Encounter: Payer: Self-pay | Admitting: Adult Health

## 2021-05-26 ENCOUNTER — Encounter: Payer: Self-pay | Admitting: *Deleted

## 2021-05-26 ENCOUNTER — Other Ambulatory Visit: Payer: Self-pay

## 2021-05-26 ENCOUNTER — Inpatient Hospital Stay (HOSPITAL_BASED_OUTPATIENT_CLINIC_OR_DEPARTMENT_OTHER): Payer: 59 | Admitting: Adult Health

## 2021-05-26 ENCOUNTER — Ambulatory Visit (HOSPITAL_COMMUNITY)
Admission: RE | Admit: 2021-05-26 | Discharge: 2021-05-26 | Disposition: A | Discharge status: Home / Self Care | Payer: 59 | Source: Ambulatory Visit | Attending: Adult Health | Admitting: Adult Health

## 2021-05-26 ENCOUNTER — Inpatient Hospital Stay: Payer: 59

## 2021-05-26 VITALS — BP 136/80 | HR 92 | Temp 97.9°F | Resp 18 | Ht 63.0 in | Wt 164.4 lb

## 2021-05-26 DIAGNOSIS — C50211 Malignant neoplasm of upper-inner quadrant of right female breast: Secondary | ICD-10-CM

## 2021-05-26 DIAGNOSIS — R059 Cough, unspecified: Secondary | ICD-10-CM | POA: Insufficient documentation

## 2021-05-26 DIAGNOSIS — Z8616 Personal history of COVID-19: Secondary | ICD-10-CM | POA: Diagnosis not present

## 2021-05-26 DIAGNOSIS — G629 Polyneuropathy, unspecified: Secondary | ICD-10-CM | POA: Insufficient documentation

## 2021-05-26 DIAGNOSIS — Z9011 Acquired absence of right breast and nipple: Secondary | ICD-10-CM | POA: Insufficient documentation

## 2021-05-26 DIAGNOSIS — Z79899 Other long term (current) drug therapy: Secondary | ICD-10-CM | POA: Insufficient documentation

## 2021-05-26 DIAGNOSIS — Z923 Personal history of irradiation: Secondary | ICD-10-CM | POA: Insufficient documentation

## 2021-05-26 DIAGNOSIS — Z171 Estrogen receptor negative status [ER-]: Secondary | ICD-10-CM

## 2021-05-26 DIAGNOSIS — C50411 Malignant neoplasm of upper-outer quadrant of right female breast: Secondary | ICD-10-CM | POA: Insufficient documentation

## 2021-05-26 DIAGNOSIS — Z9221 Personal history of antineoplastic chemotherapy: Secondary | ICD-10-CM | POA: Diagnosis not present

## 2021-05-26 DIAGNOSIS — Z8042 Family history of malignant neoplasm of prostate: Secondary | ICD-10-CM | POA: Diagnosis not present

## 2021-05-26 DIAGNOSIS — Z5112 Encounter for antineoplastic immunotherapy: Secondary | ICD-10-CM | POA: Diagnosis not present

## 2021-05-26 DIAGNOSIS — Z803 Family history of malignant neoplasm of breast: Secondary | ICD-10-CM | POA: Diagnosis not present

## 2021-05-26 DIAGNOSIS — Z95828 Presence of other vascular implants and grafts: Secondary | ICD-10-CM

## 2021-05-26 LAB — COMPREHENSIVE METABOLIC PANEL
ALT: 9 U/L (ref 0–44)
AST: 14 U/L — ABNORMAL LOW (ref 15–41)
Albumin: 3.6 g/dL (ref 3.5–5.0)
Alkaline Phosphatase: 90 U/L (ref 38–126)
Anion gap: 9 (ref 5–15)
BUN: 8 mg/dL (ref 6–20)
CO2: 24 mmol/L (ref 22–32)
Calcium: 9 mg/dL (ref 8.9–10.3)
Chloride: 106 mmol/L (ref 98–111)
Creatinine, Ser: 0.7 mg/dL (ref 0.44–1.00)
GFR, Estimated: >60 mL/min (ref >60)
Glucose, Bld: 81 mg/dL (ref 70–99)
Potassium: 3.8 mmol/L (ref 3.5–5.1)
Sodium: 139 mmol/L (ref 135–145)
Total Bilirubin: 0.2 mg/dL — ABNORMAL LOW (ref 0.3–1.2)
Total Protein: 7.2 g/dL (ref 6.5–8.1)

## 2021-05-26 LAB — CBC WITH DIFFERENTIAL/PLATELET
Abs Immature Granulocytes: 0.03 10*3/uL (ref 0.00–0.07)
Basophils Absolute: 0 10*3/uL (ref 0.0–0.1)
Basophils Relative: 0 %
Eosinophils Absolute: 0.3 10*3/uL (ref 0.0–0.5)
Eosinophils Relative: 6 %
HCT: 31.5 % — ABNORMAL LOW (ref 36.0–46.0)
Hemoglobin: 10.2 g/dL — ABNORMAL LOW (ref 12.0–15.0)
Immature Granulocytes: 1 %
Lymphocytes Relative: 37 %
Lymphs Abs: 2.1 10*3/uL (ref 0.7–4.0)
MCH: 28.6 pg (ref 26.0–34.0)
MCHC: 32.4 g/dL (ref 30.0–36.0)
MCV: 88.2 fL (ref 80.0–100.0)
Monocytes Absolute: 0.4 10*3/uL (ref 0.1–1.0)
Monocytes Relative: 7 %
Neutro Abs: 2.8 10*3/uL (ref 1.7–7.7)
Neutrophils Relative %: 49 %
Platelets: 221 10*3/uL (ref 150–400)
RBC: 3.57 MIL/uL — ABNORMAL LOW (ref 3.87–5.11)
RDW: 14.4 % (ref 11.5–15.5)
WBC: 5.7 10*3/uL (ref 4.0–10.5)
nRBC: 0 % (ref 0.0–0.2)

## 2021-05-26 LAB — TSH: TSH: 1.757 u[IU]/mL (ref 0.308–3.960)

## 2021-05-26 MED ORDER — ALBUTEROL SULFATE HFA 108 (90 BASE) MCG/ACT IN AERS: 2.0000 | INHALATION_SPRAY | Freq: Four times a day (QID) | RESPIRATORY_TRACT | 2 refills | Status: DC | PRN

## 2021-05-26 MED ORDER — SODIUM CHLORIDE 0.9 % IV SOLN: Freq: Once | INTRAVENOUS | Status: DC

## 2021-05-26 MED ORDER — SODIUM CHLORIDE 0.9% FLUSH
10.0000 mL | Freq: Once | INTRAVENOUS | Status: AC
  Administered 2021-05-26: 10 mL

## 2021-05-26 MED ORDER — LIDOCAINE-PRILOCAINE 2.5-2.5 % EX CREA: 1.0000 "application " | TOPICAL_CREAM | CUTANEOUS | 1 refills | Status: DC | PRN

## 2021-05-26 MED ORDER — SODIUM CHLORIDE 0.9 % IV SOLN
200.0000 mg | Freq: Once | INTRAVENOUS | Status: AC
  Administered 2021-05-26: 200 mg via INTRAVENOUS
  Filled 2021-05-26: qty 8

## 2021-05-26 NOTE — Patient Instructions (Signed)
[facility]  Discharge Instructions: °Thank you for choosing [facility] to provide your oncology and hematology care.  ° °If you have a lab appointment with the Cancer Center, please go directly to the Cancer Center and check in at the registration area. °  °Wear comfortable clothing and clothing appropriate for easy access to any Portacath or PICC line.  ° °We strive to give you quality time with your provider. You may need to reschedule your appointment if you arrive late (15 or more minutes).  Arriving late affects you and other patients whose appointments are after yours.  Also, if you miss three or more appointments without notifying the office, you may be dismissed from the clinic at the provider’s discretion.    °  °For prescription refill requests, have your pharmacy contact our office and allow 72 hours for refills to be completed.   ° °Today you received the following chemotherapy and/or immunotherapy agent: Pembrolizumab (Keytruda) °  °To help prevent nausea and vomiting after your treatment, we encourage you to take your nausea medication as directed. ° °BELOW ARE SYMPTOMS THAT SHOULD BE REPORTED IMMEDIATELY: °*FEVER GREATER THAN 100.4 F (38 °C) OR HIGHER °*CHILLS OR SWEATING °*NAUSEA AND VOMITING THAT IS NOT CONTROLLED WITH YOUR NAUSEA MEDICATION °*UNUSUAL SHORTNESS OF BREATH °*UNUSUAL BRUISING OR BLEEDING °*URINARY PROBLEMS (pain or burning when urinating, or frequent urination) °*BOWEL PROBLEMS (unusual diarrhea, constipation, pain near the anus) °TENDERNESS IN MOUTH AND THROAT WITH OR WITHOUT PRESENCE OF ULCERS (sore throat, sores in mouth, or a toothache) °UNUSUAL RASH, SWELLING OR PAIN  °UNUSUAL VAGINAL DISCHARGE OR ITCHING  ° °Items with * indicate a potential emergency and should be followed up as soon as possible or go to the Emergency Department if any problems should occur. ° °Please show the CHEMOTHERAPY ALERT CARD or IMMUNOTHERAPY ALERT CARD at  check-in to the Emergency Department and triage nurse. ° °Should you have questions after your visit or need to cancel or reschedule your appointment, please contact [facility]  Dept: [phone]  and follow the prompts.  Office hours are 8:00 a.m. to 4:30 p.m. Monday - Friday. Please note that voicemails left after 4:00 p.m. may not be returned until the following business day.  We are closed weekends and major holidays. You have access to a nurse at all times for urgent questions. Please call the main number to the clinic Dept: [phone] and follow the prompts. ° ° °For any non-urgent questions, you may also contact your provider using MyChart. We now offer e-Visits for anyone 18 and older to request care online for non-urgent symptoms. For details visit [email or web address]. °  °Also download the MyChart app! Go to the app store, search "MyChart", open the app, select [facility], and log in with your MyChart username and password. ° °Due to Covid, a mask is required upon entering the hospital/clinic. If you do not have a mask, one will be given to you upon arrival. For doctor visits, patients may have 1 support person aged 18 or older with them. For treatment visits, patients cannot have anyone with them due to current Covid guidelines and our immunocompromised population.  ° °

## 2021-05-26 NOTE — Progress Notes (Signed)
[facility]  [phone] [phone]    ID: Penny Brown DOB: 06/26/1971  MR#: [id number]  CSN#:[id number]  Patient Care Team: Strup, Kathryn, FNP as PCP - General (Family Medicine) Stuart, Dawn C, RN as Oncology Nurse Navigator Martini, Keisha N, RN as Oncology Nurse Navigator Magrinat, Gustav C, MD as Consulting Physician (Oncology) Wakefield, Matthew, MD as Consulting Physician (General Surgery) Moody, John, MD as Consulting Physician (Radiation Oncology) Lindsey C Causey, NP OTHER MD:  CHIEF COMPLAINT: recurrent vs. new triple negative breast cancer (s/p right mastectomy)  CURRENT TREATMENT: Continuing pembrolizumab   INTERVAL HISTORY: Penny Brown was scheduled today 03/17/2021 for follow up and treatment of her second triple negative breast cancer.  However she did not show  Since her last visit, she underwent right mastectomy on 02/24/2021 under Dr. Wakefield. Pathology from the procedure ([id number]) showed: invasive ductal carcinoma, grade 3, 0.3 cm; tumor involves area of stellate fibrosis in upper medial breast (at 1 o'clock); no carcinoma identified in 3 cm fibrocystic cavitary area in mid lateral breast.  The biopsied lymph node was negative for carcinoma (0/1).  She continues on pembrolizumab every 21 days.  She has a dose due today.  Penny Brown is feeling well today.  Her husband notes she has had a mild NP cough.  She notes that it isn't "that bad" however it has been noticeable.  Penny Brown denies any pleuritic chest pain, shortness of breath, fever or chills.  She did have covid last month.  She remains active.     REVIEW OF SYSTEMS: Review of Systems  Constitutional:  Negative for appetite change, chills, fatigue, fever and unexpected weight change.  HENT:   Negative for hearing loss, lump/mass and trouble swallowing.   Eyes:  Negative for eye problems and icterus.  Respiratory:  Positive for cough. Negative for chest tightness and  shortness of breath.   Cardiovascular:  Negative for chest pain, leg swelling and palpitations.  Gastrointestinal:  Negative for abdominal distention, abdominal pain, constipation, diarrhea, nausea and vomiting.  Endocrine: Negative for hot flashes.  Genitourinary:  Negative for difficulty urinating.   Musculoskeletal:  Negative for arthralgias.  Skin:  Negative for itching and rash.  Neurological:  Negative for dizziness, extremity weakness, headaches and numbness.  Hematological:  Negative for adenopathy. Does not bruise/bleed easily.  Psychiatric/Behavioral:  Negative for depression. The patient is not nervous/anxious.       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 She received 4 cycles of doxorubicin and cyclophosphamide followed by 4 cycles of of paclitaxel, which was discontinued secondary to neuropathy. She was released from follow up here in 2012.  More recently, she presented with a palpable upper-inner right breast mass. She underwent bilateral diagnostic mammography with tomography and right breast ultrasonography at [facility] on 09/22/2020 showing: breast density category A; 2.2 cm irregular mass in upper-inner right breast; no significant abnormalities in right axilla.  Accordingly on 09/22/2020 she proceeded to biopsy of the right breast area in question. The pathology from this procedure ([id number]) showed: invasive ductal carcinoma, grade 3. Prognostic indicators significant for: estrogen receptor, 0% negative and progesterone receptor, 0% negative. Proliferation marker Ki67 at 80%. HER2 negatve by immunohistochemistry (1+).  Cancer Staging Malignant neoplasm of upper-inner quadrant of right breast in female, estrogen    receptor negative ([place]) Staging form: Breast, AJCC 8th Edition - Clinical stage from 09/29/2020: Stage IIB (rcT2, cN0, cM0, G3, ER-, PR-, HER2-) - Signed by [name], MD on 09/29/2020 Stage prefix: Recurrence  Malignant neoplasm of upper-outer quadrant of right breast in female, estrogen receptor negative ([place]) Staging form: Breast, AJCC 8th Edition - Clinical: cT2, cN1, cM0, ER-, PR-, HER2- - Signed by [name], MD on 09/29/2020 Stage prefix: Initial diagnosis Nuclear grade: G3  The patient's subsequent history is as detailed below.   PAST MEDICAL HISTORY: Past Medical History:  Diagnosis Date   Breast cancer ([place]) 2008   Right Breast Cancer   Breast cancer ([place]) 09/2020   Right Breast   Cancer ([place])    Family history of breast cancer    Family history of prostate cancer    Personal history of chemotherapy 2008   Right Breast Cancer   Personal history of radiation therapy 2008   Right Breast Cancer    PAST SURGICAL HISTORY: Past Surgical History:  Procedure Laterality Date   BREAST LUMPECTOMY Right 2008   CESAREAN SECTION     MASTECTOMY W/ SENTINEL NODE BIOPSY Right 02/24/2021   Procedure: RIGHT MASTECTOMY WITH RIGHT AXILLARY SENTINEL LYMPH NODE BIOPSY;  Surgeon: [name], MD;  Location: [facility];  Service: General;  Laterality: Right;   PORTACATH PLACEMENT Left 10/13/2020   Procedure: INSERTION PORT-A-CATH;  Surgeon: [name], MD;  Location: [facility];  Service: General;  Laterality: Left;  LEAVE PORT ACCESSED CHEMO STARTS ON 2/10, LEFT SIDE    FAMILY HISTORY: Family History  Problem Relation Age of Onset   Breast cancer Maternal Grandmother 78   Prostate cancer Maternal Grandfather        dx 80s, metastatic   Her father died from heart attack (possibly Covid?) at age 57. Her mother is age 56 as of 09/2020. Elyshia has 2 brothers and 2 sisters. She reports breast cancer in her maternal grandmother in her late  14's.   GYNECOLOGIC HISTORY:  No LMP recorded. Menarche: 50 years old Age at first live birth: 50 years old [place] P 3 LMP 09/09/2020, periods are regular, 7 days with 4 heavy Contraceptive: s/p BTL with coils in place HRT n/a  Hysterectomy? no BSO? 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 Husband Penny Brown is a Cabin crew. She lives at home with Penny Brown, daughter Penny Brown, age 27, and daughter Penny Brown, age 77 who works as a Curator. Son Penny Brown, age 27, is a Psychiatric nurse in [employer]. Ticara has one grandchild. She attends [employer].    ADVANCED DIRECTIVES: In the absence of any documentation to the contrary, the patient's spouse is their HCPOA.    HEALTH MAINTENANCE: Social History   Tobacco Use   Smoking status: Never   Smokeless tobacco: Never  Vaping Use   Vaping Use: Never used  Substance Use Topics   Alcohol use: No   Drug use: No     Colonoscopy: n/a (age)   PAP: 2019  Bone density: n/a (age)   No Known Allergies  Current Outpatient Medications  Medication Sig Dispense Refill   albuterol (VENTOLIN HFA) 108 (90 Base) MCG/ACT inhaler Inhale 2 puffs into the lungs every 6 (six) hours as needed for wheezing or shortness of breath. 8 g 2   lidocaine-prilocaine (EMLA) cream Apply 1 application topically as needed (port access). 30 g 1  LORazepam (ATIVAN) 0.5 MG tablet Take one tablet before coming to cancer center for chemo treatment (Patient taking differently: Take 0.5 mg by mouth See admin instructions. Take one tablet before coming to cancer center for chemo treatment) 30 tablet 0   methocarbamol (ROBAXIN) 750 MG tablet Take 1 tablet (750 mg total) by mouth every 8 (eight) hours as needed (use for muscle cramps/pain). 30 tablet 2   traMADol (ULTRAM) 50 MG tablet Take 1 tablet (50 mg total) by mouth every 6 (six) hours as needed. (Patient taking differently: Take 50 mg by  mouth every 6 (six) hours as needed for severe pain.) 8 tablet 0   No current facility-administered medications for this visit.    OBJECTIVE:   Vitals:   05/26/21 1346  BP: 136/80  Pulse: 92  Resp: 18  Temp: 97.9 F (36.6 C)  SpO2: 100%      Body mass index is 29.12 kg/m.   Wt Readings from Last 3 Encounters:  05/26/21 164 lb 6.4 oz (74.6 kg)  04/21/21 164 lb 8 oz (74.6 kg)  03/31/21 163 lb 12.8 oz (74.3 kg)     ECOG FS:1 - Symptomatic but completely ambulatory GENERAL: Patient is a well appearing female in no acute distress HEENT:  Sclerae anicteric.  Oropharynx clear and moist. No ulcerations or evidence of oropharyngeal candidiasis. Neck is supple.  NODES:  No cervical, supraclavicular, or axillary lymphadenopathy palpated.  BREAST EXAM:  Deferred. LUNGS:  Mild expiratory wheeze noted, otherwise clear to auscultation bilaterally.  No rhonchi. HEART:  Regular rate and rhythm. No murmur appreciated. ABDOMEN:  Soft, nontender.  Positive, normoactive bowel sounds. No organomegaly palpated. MSK:  No focal spinal tenderness to palpation. Full range of motion bilaterally in the upper extremities. EXTREMITIES:  No peripheral edema.   SKIN:  Clear with no obvious rashes or skin changes. No nail dyscrasia. NEURO:  Nonfocal. Well oriented.  Appropriate affect.      LAB RESULTS:  CMP     Component Value Date/Time   NA 139 05/26/2021 1318   K 3.8 05/26/2021 1318   CL 106 05/26/2021 1318   CO2 24 05/26/2021 1318   GLUCOSE 81 05/26/2021 1318   BUN 8 05/26/2021 1318   CREATININE 0.70 05/26/2021 1318   CREATININE 0.71 10/28/2020 1135   CALCIUM 9.0 05/26/2021 1318   PROT 7.2 05/26/2021 1318   ALBUMIN 3.6 05/26/2021 1318   AST 14 (L) 05/26/2021 1318   AST 18 10/28/2020 1135   ALT 9 05/26/2021 1318   ALT 20 10/28/2020 1135   ALKPHOS 90 05/26/2021 1318   BILITOT 0.2 (L) 05/26/2021 1318   BILITOT 0.3 10/28/2020 1135   GFRNONAA >60 05/26/2021 1318   GFRNONAA >60 10/28/2020  1135   GFRAA  04/11/2007 1225    >60        The eGFR has been calculated using the MDRD equation. This calculation has not been validated in all clinical    No results found for: TOTALPROTELP, ALBUMINELP, A1GS, A2GS, BETS, BETA2SER, GAMS, MSPIKE, SPEI  Lab Results  Component Value Date   WBC 5.7 05/26/2021   NEUTROABS 2.8 05/26/2021   HGB 10.2 (L) 05/26/2021   HCT 31.5 (L) 05/26/2021   MCV 88.2 05/26/2021   PLT 221 05/26/2021    Lab Results  Component Value Date   LABCA2 17 11/25/2007    No components found for: DGLOVF643  No results for input(s): INR in the last 168 hours.  Lab Results  Component Value Date   LABCA2  17 11/25/2007    No results found for: OHY073  No results found for: XTG626  No results found for: RSW546  No results found for: CA2729  No components found for: HGQUANT  No results found for: CEA1 / No results found for: CEA1   No results found for: AFPTUMOR  No results found for: CHROMOGRNA  No results found for: KPAFRELGTCHN, LAMBDASER, KAPLAMBRATIO (kappa/lambda light chains)  No results found for: HGBA, HGBA2QUANT, HGBFQUANT, HGBSQUAN (Hemoglobinopathy evaluation)   Lab Results  Component Value Date   LDH 156 11/25/2007    No results found for: IRON, TIBC, IRONPCTSAT (Iron and TIBC)  No results found for: FERRITIN  Urinalysis    Component Value Date/Time   LABSPEC 1.015 03/13/2017 1905   PHURINE 7.0 03/13/2017 1905   GLUCOSEU NEGATIVE 03/13/2017 1905   HGBUR MODERATE (A) 03/13/2017 1905   BILIRUBINUR NEGATIVE 03/13/2017 1905   KETONESUR NEGATIVE 03/13/2017 [place] 03/13/2017 1905   UROBILINOGEN 0.2 03/13/2017 1905   NITRITE NEGATIVE 03/13/2017 1905   LEUKOCYTESUR MODERATE (A) 03/13/2017 1905    STUDIES: DG Chest 2 View  Result Date: 05/26/2021 CLINICAL DATA:  Cough EXAM: CHEST - 2 VIEW COMPARISON:  Chest x-ray dated May 22, 2007 FINDINGS: Left chest wall port with tip near the superior  cavoatrial junction. The heart size and mediastinal contours are within normal limits. Both lungs are clear. The visualized skeletal structures are unremarkable. IMPRESSION: No active cardiopulmonary disease. Electronically Signed   By: Yetta Glassman M.D.   On: 05/26/2021 16:53      ELIGIBLE FOR AVAILABLE RESEARCH PROTOCOL: no  ASSESSMENT: 50 y.o. [place] woman  (1) status post right breast upper outer quadrant lumpectomy and sentinel lymph node sampling 04/15/2007 for a pT2 pN1, stage IIIB invasive ductal carcinoma, grade 3, triple negative, with an MIB-1 of 59%  (a) adjuvant chemotherapy consisted of doxorubicin and cyclophosphamide in dose dense fashion x4 followed by weekly paclitaxel x12  (b) received adjuvant radiation  (c) a total of 4 right axillary lymph nodes were removed  (2) status post right breast upper inner quadrant biopsy 09/22/2020 for a clinical T2 N2, stage IIIC invasive ductal carcinoma, grade 3, triple negative, with an MIB-1 of 80%.  (a) chest CT 10/28/2020 shows indeterminate 0.3 cm or smaller pulmonary nodules  (b) bone scan 10/29/2020 shows no evidence of metatstatic disease  (3) genetics testing 10/19/2020 through the Common hereditary Cancers panel offered by [facility] shows no deleterious mutations  (4) neoadjuvant chemotherapy started 10/14/2020 consisting of pembrolizumab given every 3 weeks, with carboplatin + paclitaxel given weekly, 3 weeks on, one week off, wityh 4 cycles planned (12 carbo/Taxol doses), last dose 12/17/2020  (a) final 4 cycles of chemotherapy omitted because of neuropathy  (b) breast MRI 12/24/2020 shows near complete resolution of malignancy  (5) status post right mastectomy and sentinel lymph node sampling 02/24/2021 showing a residual ypT1a ypN0 invasive ductal carcinoma, grade 3, with ample margins  (a) a single right axillary lymph node was removed  (6) to continue pembrolizumab to total one year (9 post-op doses)   PLAN:   Penny Brown is here today to continue on treatment with Pembrolizumab.  She is tolerating it well. I reviewed her labs with her and how Pembrolizumab works.    We discussed in detail her cough.  I suspect this is allergy related and I prescribed her an albuterol inhaler.  I suggested she try zyrtec or allegra daily.  We will get a chest xray to rule out  pneumonitis.    Penny Brown will continue to receive Pembrolizumab every 3 weeks and we will se eher with every other cycle.  She knows to call for any questions that may arise between now and her next appointment.  We are happy to see her sooner if needed.   Total encounter time: 20 minutes in face to face visit time, chart review, lab review, order entry, and documentation of the encounter.   Lindsey Causey, NP 05/28/21 11:31 AM Medical Oncology and Hematology [facility] [address] [place], [place] [address] Tel. [phone]    Fax. [phone]   *Total Encounter Time as defined by the Centers for Medicare and Medicaid Services includes, in addition to the face-to-face time of a patient visit (documented in the note above) non-face-to-face time: obtaining and reviewing outside history, ordering and reviewing medications, tests or procedures, care coordination (communications with other health care professionals or caregivers) and documentation in the medical record. 

## 2021-05-27 ENCOUNTER — Telehealth: Payer: Self-pay | Admitting: *Deleted

## 2021-05-27 ENCOUNTER — Encounter (HOSPITAL_BASED_OUTPATIENT_CLINIC_OR_DEPARTMENT_OTHER): Payer: Self-pay | Admitting: Plastic Surgery

## 2021-05-27 ENCOUNTER — Telehealth: Payer: Self-pay

## 2021-05-27 NOTE — Telephone Encounter (Signed)
This RN called pt with results of CXR per [facility]CC/NP review.

## 2021-05-27 NOTE — Telephone Encounter (Signed)
Prior Authorization for Albuterol Sulfate HFA 143mcg/ACT Aerosol denied by [employer].  ProAir HFA is covered by [employer]. Notified Provider and Provider in agreement to use ProAir HFA. Patient notified and Pharmacy notified

## 2021-05-28 ENCOUNTER — Encounter: Payer: Self-pay | Admitting: Oncology

## 2021-05-30 ENCOUNTER — Ambulatory Visit: Payer: Self-pay

## 2021-05-30 NOTE — Progress Notes (Signed)

## 2021-05-30 NOTE — H&P (Signed)
  Subjective:     Patient ID: Penny Brown is a 50 y.o. female.   HPI   Returns for follow up discussion left breast reduction.   Diagnosed 2008 with right breast cancer. Underwent right lumpectomy SLN for final pT2 pN1, stage IIIB IDC, triple negative. Completed adjuvant chemotherapy and RT. In 2022, presented with palpable upper-inner right breast mass. MMG/US showed a 2.2 cm mass right UIQ. Axilla normal. Biospy showed IDC triple negative. MRI demonstrated 2.3 cm mass right UIQ consistent with the patient's biopsy-proven site of malignancy.    Staging scans showed numerous tiny pulmonary nodules throughout the lungs on CT, measuring 3 mm and smaller, nonspecific.   Completed neoadjuvant chemotherapy, stopped due to neuropathy. Final MRI showed subtle enhancement remains at the site of the patient's known malignancy, consistent with near complete resolution. Underwent mastectomy with final pathology 0.3 cm IDC, 0/1 SLN.   Will receive Keytruda for year duration. Genetics negative   Diagnostic MMG bilateral 09/2020   Prior 38 DDD. 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 Lives with spouse, who is a Cabin crew, two teenage daughters. Has one adult son.    Review of Systems      Objective:   Physical Exam Cardiovascular:     Rate and Rhythm: Normal rate and regular rhythm.     Heart sounds: Normal heart sounds.  Pulmonary:     Effort: Pulmonary effort is normal.     Breath sounds: Normal breath sounds.  Abdominal:     Comments: sufficient tissue for reconstruction  Skin:    Comments: Fitzpatrick 6    Breasts: Absent right breast, anchor scar  Left chest port Grade 3 ptosis left No mass palpable SN to nipple L 38 CW R BW L 24 cm Nipple to IMF L 15 cm      Assessment:     History triple negative right breast cancer s/p lumpectomy SLN History therapeutic radiation Right breast ca UIQ ER- Neoadjuvant chemotherapy  S/p right  mastectomy SLN    Plan:     Has appt with Dr. Mercy Moore scheduled for Nov 2022 to discuss autologous/microsurgical options breast reconstruction.    Presently patient has elected for left breast reduction alone in order to allow for smaller breast, smaller prosthesis. Reviewed left reduction with anchor type scars, OP surgery, drain, post operative visits and limitations, recovery. Diminished sensation nipple and breast skin, risk of nipple loss, wound healing problems, asymmetry, incidental carcinoma, changes with wt gain/loss, aging, unacceptable cosmetic appearance reviewed.    Additional risks including but not limited to bleeding infection seroma hematoma damage to adjacent structures needs for additional procedures blood clots in legs or lungs reviewed.    Rx for oxycodone given.

## 2021-06-02 ENCOUNTER — Encounter: Payer: Self-pay | Admitting: *Deleted

## 2021-06-02 NOTE — Anesthesia Preprocedure Evaluation (Addendum)
Anesthesia Evaluation  Patient identified by MRN, date of birth, ID band Patient awake    Reviewed: Allergy & Precautions, NPO status , Patient's Chart, lab work & pertinent test results  Airway Mallampati: II  TM Distance: >3 FB Neck ROM: Full    Dental no notable dental hx. (+) Teeth Intact, Dental Advisory Given   Pulmonary    Pulmonary exam normal breath sounds clear to auscultation       Cardiovascular negative cardio ROS Normal cardiovascular exam Rhythm:Regular Rate:Normal     Neuro/Psych negative neurological ROS  negative psych ROS   GI/Hepatic negative GI ROS, Neg liver ROS,   Endo/Other  negative endocrine ROS  Renal/GU negative Renal ROS     Musculoskeletal negative musculoskeletal ROS (+)   Abdominal (+) - obese (BMI 30.00),   Peds  Hematology Lab Results      Component                Value               Date                      WBC                      5.7                 05/26/2021                HGB                      10.2 (L)            05/26/2021                HCT                      31.5 (L)            05/26/2021                MCV                      88.2                05/26/2021                PLT                      221                 05/26/2021              Anesthesia Other Findings S/P R Lumpectomy for Stage III breast CA  Reproductive/Obstetrics                            Anesthesia Physical Anesthesia Plan  ASA: 2  Anesthesia Plan: General   Post-op Pain Management:    Induction: Intravenous  PONV Risk Score and Plan: 4 or greater and Treatment may vary due to age or medical condition, Midazolam, Dexamethasone, Ondansetron and Scopolamine patch - Pre-op  Airway Management Planned: Oral ETT  Additional Equipment: None  Intra-op Plan:   Post-operative Plan: Extubation in OR  Informed Consent: I have reviewed the patients History and  Physical, chart, labs and discussed the procedure including the risks, benefits and alternatives for the  proposed anesthesia with the patient or authorized representative who has indicated his/her understanding and acceptance.     Dental advisory given  Plan Discussed with: CRNA and Anesthesiologist  Anesthesia Plan Comments: (ga ETT)       Anesthesia Quick Evaluation

## 2021-06-03 ENCOUNTER — Ambulatory Visit (HOSPITAL_BASED_OUTPATIENT_CLINIC_OR_DEPARTMENT_OTHER)
Admission: RE | Admit: 2021-06-03 | Discharge: 2021-06-03 | Disposition: A | Discharge status: Home / Self Care | Payer: 59 | Attending: Plastic Surgery | Admitting: Plastic Surgery

## 2021-06-03 ENCOUNTER — Ambulatory Visit (HOSPITAL_BASED_OUTPATIENT_CLINIC_OR_DEPARTMENT_OTHER): Payer: 59 | Admitting: Certified Registered"

## 2021-06-03 ENCOUNTER — Other Ambulatory Visit: Payer: Self-pay

## 2021-06-03 ENCOUNTER — Encounter (HOSPITAL_BASED_OUTPATIENT_CLINIC_OR_DEPARTMENT_OTHER)
Admission: RE | Disposition: A | Discharge status: Home / Self Care | Payer: Self-pay | Source: Home / Self Care | Attending: Plastic Surgery

## 2021-06-03 ENCOUNTER — Encounter (HOSPITAL_BASED_OUTPATIENT_CLINIC_OR_DEPARTMENT_OTHER): Payer: Self-pay | Admitting: Plastic Surgery

## 2021-06-03 DIAGNOSIS — R918 Other nonspecific abnormal finding of lung field: Secondary | ICD-10-CM | POA: Diagnosis not present

## 2021-06-03 DIAGNOSIS — Z9221 Personal history of antineoplastic chemotherapy: Secondary | ICD-10-CM | POA: Diagnosis not present

## 2021-06-03 DIAGNOSIS — N6489 Other specified disorders of breast: Secondary | ICD-10-CM | POA: Diagnosis not present

## 2021-06-03 DIAGNOSIS — Z803 Family history of malignant neoplasm of breast: Secondary | ICD-10-CM | POA: Diagnosis not present

## 2021-06-03 DIAGNOSIS — Z853 Personal history of malignant neoplasm of breast: Secondary | ICD-10-CM | POA: Diagnosis present

## 2021-06-03 DIAGNOSIS — Z923 Personal history of irradiation: Secondary | ICD-10-CM | POA: Insufficient documentation

## 2021-06-03 DIAGNOSIS — Z9011 Acquired absence of right breast and nipple: Secondary | ICD-10-CM | POA: Insufficient documentation

## 2021-06-03 DIAGNOSIS — N6082 Other benign mammary dysplasias of left breast: Secondary | ICD-10-CM | POA: Insufficient documentation

## 2021-06-03 HISTORY — PX: BREAST REDUCTION SURGERY: SHX8

## 2021-06-03 LAB — POCT PREGNANCY, URINE: Preg Test, Ur: NEGATIVE

## 2021-06-03 SURGERY — MAMMOPLASTY, REDUCTION
Anesthesia: General | Site: Breast | Laterality: Left

## 2021-06-03 MED ORDER — HYDROMORPHONE HCL 1 MG/ML IJ SOLN
0.2500 mg | INTRAMUSCULAR | Status: DC | PRN
  Administered 2021-06-03 (×2): 0.5 mg via INTRAVENOUS

## 2021-06-03 MED ORDER — BUPIVACAINE-EPINEPHRINE (PF) 0.25% -1:200000 IJ SOLN
INTRAMUSCULAR | Status: DC | PRN
  Administered 2021-06-03: 20 mL

## 2021-06-03 MED ORDER — LACTATED RINGERS IV SOLN: INTRAVENOUS | Status: DC

## 2021-06-03 MED ORDER — CEFAZOLIN SODIUM-DEXTROSE 2-4 GM/100ML-% IV SOLN
2.0000 g | INTRAVENOUS | Status: AC
  Administered 2021-06-03: 2 g via INTRAVENOUS

## 2021-06-03 MED ORDER — LIDOCAINE 2% (20 MG/ML) 5 ML SYRINGE
INTRAMUSCULAR | Status: AC
  Filled 2021-06-03: qty 5

## 2021-06-03 MED ORDER — LIDOCAINE 2% (20 MG/ML) 5 ML SYRINGE
INTRAMUSCULAR | Status: DC | PRN
  Administered 2021-06-03: 100 mg via INTRAVENOUS

## 2021-06-03 MED ORDER — SCOPOLAMINE 1 MG/3DAYS TD PT72
MEDICATED_PATCH | TRANSDERMAL | Status: AC
  Filled 2021-06-03: qty 1

## 2021-06-03 MED ORDER — GABAPENTIN 300 MG PO CAPS
ORAL_CAPSULE | ORAL | Status: AC
  Filled 2021-06-03: qty 1

## 2021-06-03 MED ORDER — PHENYLEPHRINE 40 MCG/ML (10ML) SYRINGE FOR IV PUSH (FOR BLOOD PRESSURE SUPPORT)
PREFILLED_SYRINGE | INTRAVENOUS | Status: AC
  Filled 2021-06-03: qty 10

## 2021-06-03 MED ORDER — ONDANSETRON HCL 4 MG/2ML IJ SOLN
INTRAMUSCULAR | Status: AC
  Filled 2021-06-03: qty 2

## 2021-06-03 MED ORDER — CEFAZOLIN SODIUM-DEXTROSE 2-4 GM/100ML-% IV SOLN
INTRAVENOUS | Status: AC
  Filled 2021-06-03: qty 100

## 2021-06-03 MED ORDER — FENTANYL CITRATE (PF) 100 MCG/2ML IJ SOLN
INTRAMUSCULAR | Status: AC
  Filled 2021-06-03: qty 2

## 2021-06-03 MED ORDER — SCOPOLAMINE 1 MG/3DAYS TD PT72
1.0000 | MEDICATED_PATCH | TRANSDERMAL | Status: DC
  Administered 2021-06-03: 1.5 mg via TRANSDERMAL

## 2021-06-03 MED ORDER — ACETAMINOPHEN 10 MG/ML IV SOLN: 1000.0000 mg | Freq: Once | INTRAVENOUS | Status: DC | PRN

## 2021-06-03 MED ORDER — ONDANSETRON HCL 4 MG/2ML IJ SOLN: 4.0000 mg | Freq: Once | INTRAMUSCULAR | Status: DC | PRN

## 2021-06-03 MED ORDER — AMISULPRIDE (ANTIEMETIC) 5 MG/2ML IV SOLN: 10.0000 mg | Freq: Once | INTRAVENOUS | Status: DC | PRN

## 2021-06-03 MED ORDER — MIDAZOLAM HCL 5 MG/5ML IJ SOLN
INTRAMUSCULAR | Status: DC | PRN
  Administered 2021-06-03: 2 mg via INTRAVENOUS

## 2021-06-03 MED ORDER — HYDROMORPHONE HCL 1 MG/ML IJ SOLN
INTRAMUSCULAR | Status: AC
  Filled 2021-06-03: qty 0.5

## 2021-06-03 MED ORDER — OXYCODONE HCL 5 MG/5ML PO SOLN: 5.0000 mg | Freq: Once | ORAL | Status: DC | PRN

## 2021-06-03 MED ORDER — PROPOFOL 10 MG/ML IV BOLUS
INTRAVENOUS | Status: AC
  Filled 2021-06-03: qty 20

## 2021-06-03 MED ORDER — CELECOXIB 200 MG PO CAPS
200.0000 mg | ORAL_CAPSULE | ORAL | Status: AC
  Administered 2021-06-03: 200 mg via ORAL

## 2021-06-03 MED ORDER — ROCURONIUM BROMIDE 100 MG/10ML IV SOLN
INTRAVENOUS | Status: DC | PRN
  Administered 2021-06-03: 60 mg via INTRAVENOUS

## 2021-06-03 MED ORDER — 0.9 % SODIUM CHLORIDE (POUR BTL) OPTIME
TOPICAL | Status: DC | PRN
  Administered 2021-06-03: 200 mL

## 2021-06-03 MED ORDER — GABAPENTIN 300 MG PO CAPS
300.0000 mg | ORAL_CAPSULE | ORAL | Status: AC
  Administered 2021-06-03: 300 mg via ORAL

## 2021-06-03 MED ORDER — ONDANSETRON HCL 4 MG/2ML IJ SOLN
INTRAMUSCULAR | Status: DC | PRN
  Administered 2021-06-03: 4 mg via INTRAVENOUS

## 2021-06-03 MED ORDER — CHLORHEXIDINE GLUCONATE CLOTH 2 % EX PADS: 6.0000 | MEDICATED_PAD | Freq: Once | CUTANEOUS | Status: DC

## 2021-06-03 MED ORDER — FENTANYL CITRATE (PF) 100 MCG/2ML IJ SOLN
INTRAMUSCULAR | Status: DC | PRN
  Administered 2021-06-03: 50 ug via INTRAVENOUS
  Administered 2021-06-03: 100 ug via INTRAVENOUS

## 2021-06-03 MED ORDER — PHENYLEPHRINE HCL (PRESSORS) 10 MG/ML IV SOLN
INTRAVENOUS | Status: DC | PRN
  Administered 2021-06-03: 40 ug via INTRAVENOUS
  Administered 2021-06-03 (×3): 80 ug via INTRAVENOUS
  Administered 2021-06-03: 40 ug via INTRAVENOUS
  Administered 2021-06-03: 80 ug via INTRAVENOUS

## 2021-06-03 MED ORDER — DEXAMETHASONE SODIUM PHOSPHATE 10 MG/ML IJ SOLN
INTRAMUSCULAR | Status: DC | PRN
  Administered 2021-06-03: 5 mg via INTRAVENOUS

## 2021-06-03 MED ORDER — ESMOLOL HCL 100 MG/10ML IV SOLN
INTRAVENOUS | Status: DC | PRN
  Administered 2021-06-03 (×2): 30 mg via INTRAVENOUS

## 2021-06-03 MED ORDER — ROCURONIUM BROMIDE 10 MG/ML (PF) SYRINGE
PREFILLED_SYRINGE | INTRAVENOUS | Status: AC
  Filled 2021-06-03: qty 10

## 2021-06-03 MED ORDER — CELECOXIB 200 MG PO CAPS
ORAL_CAPSULE | ORAL | Status: AC
  Filled 2021-06-03: qty 1

## 2021-06-03 MED ORDER — ACETAMINOPHEN 500 MG PO TABS
ORAL_TABLET | ORAL | Status: AC
  Filled 2021-06-03: qty 2

## 2021-06-03 MED ORDER — SUGAMMADEX SODIUM 200 MG/2ML IV SOLN
INTRAVENOUS | Status: DC | PRN
Start: 2021-06-03 — End: 2021-06-03
  Administered 2021-06-03: 150 mg via INTRAVENOUS

## 2021-06-03 MED ORDER — OXYCODONE HCL 5 MG PO TABS
5.0000 mg | ORAL_TABLET | Freq: Once | ORAL | Status: DC | PRN
Start: 2021-06-03 — End: 2021-06-03

## 2021-06-03 MED ORDER — DEXAMETHASONE SODIUM PHOSPHATE 10 MG/ML IJ SOLN
INTRAMUSCULAR | Status: AC
  Filled 2021-06-03: qty 1

## 2021-06-03 MED ORDER — PROPOFOL 10 MG/ML IV BOLUS
INTRAVENOUS | Status: DC | PRN
  Administered 2021-06-03 (×2): 100 mg via INTRAVENOUS

## 2021-06-03 MED ORDER — MIDAZOLAM HCL 2 MG/2ML IJ SOLN
INTRAMUSCULAR | Status: AC
  Filled 2021-06-03: qty 2

## 2021-06-03 MED ORDER — ESMOLOL HCL 100 MG/10ML IV SOLN
INTRAVENOUS | Status: AC
  Filled 2021-06-03: qty 10

## 2021-06-03 MED ORDER — ACETAMINOPHEN 500 MG PO TABS
1000.0000 mg | ORAL_TABLET | ORAL | Status: AC
  Administered 2021-06-03: 1000 mg via ORAL

## 2021-06-03 MED ORDER — DEXMEDETOMIDINE (PRECEDEX) IN NS 20 MCG/5ML (4 MCG/ML) IV SYRINGE
PREFILLED_SYRINGE | INTRAVENOUS | Status: DC | PRN
  Administered 2021-06-03 (×2): 4 ug via INTRAVENOUS
  Administered 2021-06-03: 12 ug via INTRAVENOUS

## 2021-06-03 SURGICAL SUPPLY — 47 items
ADH SKN CLS APL DERMABOND .7 (GAUZE/BANDAGES/DRESSINGS) ×1
APL PRP STRL LF DISP 70% ISPRP (MISCELLANEOUS) ×2
BINDER BREAST XLRG (GAUZE/BANDAGES/DRESSINGS) ×1 IMPLANT
BINDER BREAST XXLRG (GAUZE/BANDAGES/DRESSINGS) IMPLANT
BLADE SURG 10 STRL SS (BLADE) ×8 IMPLANT
BNDG GAUZE ELAST 4 BULKY (GAUZE/BANDAGES/DRESSINGS) ×4 IMPLANT
CANISTER SUCT 1200ML W/VALVE (MISCELLANEOUS) ×2 IMPLANT
CHLORAPREP W/TINT 26 (MISCELLANEOUS) ×4 IMPLANT
COVER BACK TABLE 60X90IN (DRAPES) ×2 IMPLANT
COVER MAYO STAND STRL (DRAPES) ×2 IMPLANT
DERMABOND ADVANCED (GAUZE/BANDAGES/DRESSINGS) ×1
DERMABOND ADVANCED .7 DNX12 (GAUZE/BANDAGES/DRESSINGS) ×1 IMPLANT
DRAIN CHANNEL 15F RND FF W/TCR (WOUND CARE) ×1 IMPLANT
DRAPE TOP ARMCOVERS (MISCELLANEOUS) ×2 IMPLANT
DRAPE U-SHAPE 76X120 STRL (DRAPES) ×2 IMPLANT
DRAPE UTILITY XL STRL (DRAPES) ×2 IMPLANT
DRSG PAD ABDOMINAL 8X10 ST (GAUZE/BANDAGES/DRESSINGS) ×4 IMPLANT
ELECT COATED BLADE 2.86 ST (ELECTRODE) ×2 IMPLANT
ELECT REM PT RETURN 9FT ADLT (ELECTROSURGICAL) ×2
ELECTRODE REM PT RTRN 9FT ADLT (ELECTROSURGICAL) ×1 IMPLANT
EVACUATOR SILICONE 100CC (DRAIN) ×1 IMPLANT
GLOVE SURG HYDRASOFT LTX SZ5.5 (GLOVE) ×3 IMPLANT
GLOVE SURG POLYISO LF SZ7 (GLOVE) ×1 IMPLANT
GLOVE SURG UNDER POLY LF SZ7 (GLOVE) ×1 IMPLANT
GOWN STRL REUS W/ TWL LRG LVL3 (GOWN DISPOSABLE) ×2 IMPLANT
GOWN STRL REUS W/TWL LRG LVL3 (GOWN DISPOSABLE) ×4
NDL HYPO 25X1 1.5 SAFETY (NEEDLE) IMPLANT
NEEDLE HYPO 25X1 1.5 SAFETY (NEEDLE) ×2 IMPLANT
NS IRRIG 1000ML POUR BTL (IV SOLUTION) ×2 IMPLANT
PACK BASIN DAY SURGERY FS (CUSTOM PROCEDURE TRAY) ×2 IMPLANT
PENCIL SMOKE EVACUATOR (MISCELLANEOUS) ×2 IMPLANT
PIN SAFETY STERILE (MISCELLANEOUS) ×2 IMPLANT
SHEET MEDIUM DRAPE 40X70 STRL (DRAPES) ×4 IMPLANT
SLEEVE SCD COMPRESS KNEE MED (STOCKING) ×2 IMPLANT
SPONGE T-LAP 18X18 ~~LOC~~+RFID (SPONGE) ×6 IMPLANT
STAPLER VISISTAT 35W (STAPLE) ×2 IMPLANT
SUT ETHILON 2 0 FS 18 (SUTURE) ×2 IMPLANT
SUT MNCRL AB 4-0 PS2 18 (SUTURE) ×6 IMPLANT
SUT VIC AB 3-0 PS1 18 (SUTURE) ×8
SUT VIC AB 3-0 PS1 18XBRD (SUTURE) ×4 IMPLANT
SUT VICRYL 4-0 PS2 18IN ABS (SUTURE) ×3 IMPLANT
SYR BULB IRRIG 60ML STRL (SYRINGE) ×2 IMPLANT
SYR CONTROL 10ML LL (SYRINGE) ×1 IMPLANT
TOWEL GREEN STERILE FF (TOWEL DISPOSABLE) ×4 IMPLANT
TUBE CONNECTING 20X1/4 (TUBING) ×2 IMPLANT
UNDERPAD 30X36 HEAVY ABSORB (UNDERPADS AND DIAPERS) ×4 IMPLANT
YANKAUER SUCT BULB TIP NO VENT (SUCTIONS) ×2 IMPLANT

## 2021-06-03 NOTE — Discharge Instructions (Addendum)
Next dose of Tylenol/Ibuprofen/NSAIDs after 2pm for pain as needed.    Post Anesthesia Home Care Instructions  Activity: Get plenty of rest for the remainder of the day. A responsible individual must stay with you for 24 hours following the procedure.  For the next 24 hours, DO NOT: -Drive a car -Paediatric nurse -Drink alcoholic beverages -Take any medication unless instructed by your physician -Make any legal decisions or sign important papers.  Meals: Start with liquid foods such as gelatin or soup. Progress to regular foods as tolerated. Avoid greasy, spicy, heavy foods. If nausea and/or vomiting occur, drink only clear liquids until the nausea and/or vomiting subsides. Call your physician if vomiting continues.  Special Instructions/Symptoms: Your throat may feel dry or sore from the anesthesia or the breathing tube placed in your throat during surgery. If this causes discomfort, gargle with warm salt water. The discomfort should disappear within 24 hours.  If you had a scopolamine patch placed behind your ear for the management of post- operative nausea and/or vomiting:  1. The medication in the patch is effective for 72 hours, after which it should be removed.  Wrap patch in a tissue and discard in the trash. Wash hands thoroughly with soap and water. 2. You may remove the patch earlier than 72 hours if you experience unpleasant side effects which may include dry mouth, dizziness or visual disturbances. 3. Avoid touching the patch. Wash your hands with soap and water after contact with the patch.        JP Drain [employer] this sheet to all of your post-operative appointments while you have your drains. Please measure your drains by CC's or ML's. Make sure you drain and measure your JP Drains 2 or 3 times per day. At the end of each day, add up totals for the left side and add up totals for the right side.    ( 9 am )     ( 3 pm )        ( 9 pm )                Date L  R   L  R  L  R  Total L/R                                                                                                                                                                                         About my Jackson-Pratt Bulb Drain  What is a Jackson-Pratt bulb? A Jackson-Pratt is a soft, round device used to collect drainage. It is connected to a long, thin drainage catheter, which is held in place  by one or two small stiches near your surgical incision site. When the bulb is squeezed, it forms a vacuum, forcing the drainage to empty into the bulb.  Emptying the Jackson-Pratt bulb- To empty the bulb: 1. Release the plug on the top of the bulb. 2. Pour the bulb's contents into a measuring container which your nurse will provide. 3. Record the time emptied and amount of drainage. Empty the drain(s) as often as your     doctor or nurse recommends.  Date                  Time                    Amount (Drain 1)                 Amount (Drain 2)  _____________________________________________________________________  _____________________________________________________________________  _____________________________________________________________________  _____________________________________________________________________  _____________________________________________________________________  _____________________________________________________________________  _____________________________________________________________________  _____________________________________________________________________  Squeezing the Jackson-Pratt Bulb- To squeeze the bulb: 1. Make sure the plug at the top of the bulb is open. 2. Squeeze the bulb tightly in your fist. You will hear air squeezing from the bulb. 3. Replace the plug while the bulb is squeezed. 4. Use a safety pin to attach the bulb to your clothing. This will keep the catheter from     pulling at the bulb insertion  site.  When to call your doctor- Call your doctor if: Drain site becomes red, swollen or hot. You have a fever greater than 101 degrees F. There is oozing at the drain site. Drain falls out (apply a guaze bandage over the drain hole and secure it with tape). Drainage increases daily not related to activity patterns. (You will usually have more drainage when you are active than when you are resting.) Drainage has a bad odor.

## 2021-06-03 NOTE — Anesthesia Procedure Notes (Addendum)
Procedure Name: Intubation Date/Time: 06/03/2021 9:26 AM Performed by: Barnet Glasgow, MD Pre-anesthesia Checklist: Patient identified, Emergency Drugs available, Suction available and Patient being monitored Patient Re-evaluated:Patient Re-evaluated prior to induction Oxygen Delivery Method: Circle system utilized Preoxygenation: Pre-oxygenation with 100% oxygen Induction Type: IV induction Ventilation: Mask ventilation without difficulty Laryngoscope Size: Mac and 3 Grade View: Grade III Tube type: Oral Tube size: 7.0 mm Number of attempts: 2 Airway Equipment and Method: Stylet and Oral airway Placement Confirmation: ETT inserted through vocal cords under direct vision, positive ETCO2 and breath sounds checked- equal and bilateral Secured at: 23 cm Tube secured with: Tape Dental Injury: Teeth and Oropharynx as per pre-operative assessment  Comments: DL by CRNA, Grade 3 view, esophageal placement. Atraumatic intubation by Dr. Valma Cava, grade 3 view with Mac 3 and anterior pressure

## 2021-06-03 NOTE — Interval H&P Note (Signed)
History and Physical Interval Note:  06/03/2021 8:56 AM  Penny Brown  has presented today for surgery, with the diagnosis of history breast cancer, acquired absence breast, history therapeutic radiation.  The various methods of treatment have been discussed with the patient and family. After consideration of risks, benefits and other options for treatment, the patient has consented to  Procedure(s): MAMMARY REDUCTION  (BREAST) (Left) as a surgical intervention.  The patient's history has been reviewed, patient examined, no change in status, stable for surgery.  I have reviewed the patient's chart and labs.  Questions were answered to the patient's satisfaction.     Arnoldo Hooker Eliah Ozawa

## 2021-06-03 NOTE — Transfer of Care (Signed)
Immediate Anesthesia Transfer of Care Note  Patient: Penny Brown  Procedure(s) Performed: MAMMARY REDUCTION  (BREAST) (Left: Breast)  Patient Location: PACU  Anesthesia Type:General  Level of Consciousness: drowsy  Airway & Oxygen Therapy: Patient Spontanous Breathing and Patient connected to face mask oxygen  Post-op Assessment: Report given to RN and Post -op Vital signs reviewed and stable  Post vital signs: Reviewed and stable  Last Vitals:  Vitals Value Taken Time  BP 112/77 06/03/21 1145  Temp    Pulse 93 06/03/21 1147  Resp 14 06/03/21 1147  SpO2 99 % 06/03/21 1147  Vitals shown include unvalidated device data.  Last Pain:  Vitals:   06/03/21 0800  TempSrc: Oral  PainSc: 0-No pain         Complications: No notable events documented.

## 2021-06-03 NOTE — Op Note (Signed)
Operative Note   DATE OF OPERATION: 9.30.22  LOCATION: [facility]-outpatient  SURGICAL DIVISION: Plastic Surgery  PREOPERATIVE DIAGNOSES:  1. History breast cancer 2. Acquired absence breast 3. History therapeutic radiation  POSTOPERATIVE DIAGNOSES:  same  PROCEDURE:  Left breast reduction  SURGEON: Irene Limbo MD MBA  ASSISTANT: none  ANESTHESIA:  General.   EBL: 50 ml  COMPLICATIONS: None immediate.   INDICATIONS FOR PROCEDURE:  The patient, Penny Brown, is a 50 y.o. female born on Feb 11, 1971, is here for left breast reduction following right mastectomy.   FINDINGS: Left reduction 1491 g  DESCRIPTION OF PROCEDURE:  The patient was marked standing in the preoperative area to mark sternal notch, chest midline, anterior axillary line, inframammary fold. The location of new nipple areolar complex was marked at level of on inframammary fold on anterior surface breast by palpation. With aid of Wise pattern marker, location of new nipple areolar complex and vertical limbs (8 cm) were marked by displacement of breasts along meridian. The patient was taken to the operating room. SCDs were placed and IV antibiotics were given. The patient's operative site was prepped and draped in a sterile fashion. A time out was performed and all information was confirmed to be correct.     Over left breast, superomedial pedicle marked and nipple areolar complex incised with 45 mm diameter marker. Pedicle deepithlialized and developed to chest wall. Breast tissue resected over lower pole. Medial and lateral flaps developed. Additional superior pole and lateral chest wall tissue excised. Breast cavity irrigated and hemostasis obtained. Local anesthetic infiltrated throughout breast. 15 Fr JP placed in breast and secured with 2-0 nylon. Closure completed with 3-0 vicryl to approximate dermis along inframammary fold and vertical limb. NAC inset with 4-0 vicryl in dermis. Skin closure completed  with 4-0 monocryl subcuticular throughout. Tissue adhesive applied. Dry dressing and breast binder applied.  The patient was allowed to wake from anesthesia, extubated and taken to the recovery room in satisfactory condition.   SPECIMENS: left breast reduction  DRAINS: 15 Fr JP

## 2021-06-03 NOTE — Anesthesia Postprocedure Evaluation (Signed)
Anesthesia Post Note  Patient: Penny Brown  Procedure(s) Performed: MAMMARY REDUCTION  (BREAST) (Left: Breast)     Patient location during evaluation: PACU Anesthesia Type: General Level of consciousness: awake and alert Pain management: pain level controlled Vital Signs Assessment: post-procedure vital signs reviewed and stable Respiratory status: spontaneous breathing, nonlabored ventilation, respiratory function stable and patient connected to nasal cannula oxygen Cardiovascular status: blood pressure returned to baseline and stable Postop Assessment: no apparent nausea or vomiting Anesthetic complications: no   No notable events documented.  Last Vitals:  Vitals:   06/03/21 1245 06/03/21 1300  BP: (!) 134/94 126/90  Pulse: 91 88  Resp: 12 12  Temp:  36.4 C  SpO2: 96% 98%    Last Pain:  Vitals:   06/03/21 1300  TempSrc:   PainSc: 3                  Barnet Glasgow

## 2021-06-04 HISTORY — PX: REDUCTION MAMMAPLASTY: SUR839

## 2021-06-06 ENCOUNTER — Encounter (HOSPITAL_BASED_OUTPATIENT_CLINIC_OR_DEPARTMENT_OTHER): Payer: Self-pay | Admitting: Plastic Surgery

## 2021-06-06 LAB — SURGICAL PATHOLOGY

## 2021-06-16 ENCOUNTER — Ambulatory Visit: Payer: 59

## 2021-06-16 ENCOUNTER — Other Ambulatory Visit: Payer: 59

## 2021-06-28 ENCOUNTER — Encounter: Payer: Self-pay | Admitting: Oncology

## 2021-07-07 ENCOUNTER — Telehealth: Payer: Self-pay

## 2021-07-07 ENCOUNTER — Other Ambulatory Visit: Payer: Self-pay

## 2021-07-07 ENCOUNTER — Other Ambulatory Visit: Payer: 59

## 2021-07-07 ENCOUNTER — Inpatient Hospital Stay: Payer: 59

## 2021-07-07 ENCOUNTER — Inpatient Hospital Stay: Payer: 59 | Attending: Oncology | Admitting: Adult Health

## 2021-07-07 ENCOUNTER — Ambulatory Visit: Payer: 59

## 2021-07-07 ENCOUNTER — Encounter: Payer: Self-pay | Admitting: Adult Health

## 2021-07-07 VITALS — HR 95

## 2021-07-07 VITALS — BP 142/84 | HR 101 | Temp 97.8°F | Resp 18 | Ht 62.0 in | Wt 162.0 lb

## 2021-07-07 DIAGNOSIS — R918 Other nonspecific abnormal finding of lung field: Secondary | ICD-10-CM | POA: Diagnosis not present

## 2021-07-07 DIAGNOSIS — Z5112 Encounter for antineoplastic immunotherapy: Secondary | ICD-10-CM | POA: Diagnosis not present

## 2021-07-07 DIAGNOSIS — Z171 Estrogen receptor negative status [ER-]: Secondary | ICD-10-CM

## 2021-07-07 DIAGNOSIS — Z170 Estrogen receptor positive status [ER+]: Secondary | ICD-10-CM | POA: Diagnosis not present

## 2021-07-07 DIAGNOSIS — C50211 Malignant neoplasm of upper-inner quadrant of right female breast: Secondary | ICD-10-CM | POA: Insufficient documentation

## 2021-07-07 DIAGNOSIS — Z95828 Presence of other vascular implants and grafts: Secondary | ICD-10-CM

## 2021-07-07 DIAGNOSIS — C50411 Malignant neoplasm of upper-outer quadrant of right female breast: Secondary | ICD-10-CM | POA: Diagnosis not present

## 2021-07-07 LAB — CBC WITH DIFFERENTIAL/PLATELET
Abs Immature Granulocytes: 0.05 10*3/uL (ref 0.00–0.07)
Basophils Absolute: 0 10*3/uL (ref 0.0–0.1)
Basophils Relative: 0 %
Eosinophils Absolute: 0.5 10*3/uL (ref 0.0–0.5)
Eosinophils Relative: 6 %
HCT: 31.9 % — ABNORMAL LOW (ref 36.0–46.0)
Hemoglobin: 10.2 g/dL — ABNORMAL LOW (ref 12.0–15.0)
Immature Granulocytes: 1 %
Lymphocytes Relative: 31 %
Lymphs Abs: 2.3 10*3/uL (ref 0.7–4.0)
MCH: 28.4 pg (ref 26.0–34.0)
MCHC: 32 g/dL (ref 30.0–36.0)
MCV: 88.9 fL (ref 80.0–100.0)
Monocytes Absolute: 0.5 10*3/uL (ref 0.1–1.0)
Monocytes Relative: 7 %
Neutro Abs: 4.2 10*3/uL (ref 1.7–7.7)
Neutrophils Relative %: 55 %
Platelets: 250 10*3/uL (ref 150–400)
RBC: 3.59 MIL/uL — ABNORMAL LOW (ref 3.87–5.11)
RDW: 13.9 % (ref 11.5–15.5)
WBC: 7.5 10*3/uL (ref 4.0–10.5)
nRBC: 0 % (ref 0.0–0.2)

## 2021-07-07 LAB — COMPREHENSIVE METABOLIC PANEL
ALT: 9 U/L (ref 0–44)
AST: 14 U/L — ABNORMAL LOW (ref 15–41)
Albumin: 3.6 g/dL (ref 3.5–5.0)
Alkaline Phosphatase: 99 U/L (ref 38–126)
Anion gap: 9 (ref 5–15)
BUN: 9 mg/dL (ref 6–20)
CO2: 22 mmol/L (ref 22–32)
Calcium: 8.7 mg/dL — ABNORMAL LOW (ref 8.9–10.3)
Chloride: 105 mmol/L (ref 98–111)
Creatinine, Ser: 0.71 mg/dL (ref 0.44–1.00)
GFR, Estimated: >60 mL/min (ref >60)
Glucose, Bld: 82 mg/dL (ref 70–99)
Potassium: 4.1 mmol/L (ref 3.5–5.1)
Sodium: 136 mmol/L (ref 135–145)
Total Bilirubin: <0.2 mg/dL — ABNORMAL LOW (ref 0.3–1.2)
Total Protein: 7.6 g/dL (ref 6.5–8.1)

## 2021-07-07 MED ORDER — SODIUM CHLORIDE 0.9% FLUSH
10.0000 mL | Freq: Once | INTRAVENOUS | Status: AC
  Administered 2021-07-07: 10 mL

## 2021-07-07 MED ORDER — SODIUM CHLORIDE 0.9 % IV SOLN
200.0000 mg | Freq: Once | INTRAVENOUS | Status: AC
  Administered 2021-07-07: 200 mg via INTRAVENOUS
  Filled 2021-07-07: qty 8

## 2021-07-07 MED ORDER — SODIUM CHLORIDE 0.9 % IV SOLN: Freq: Once | INTRAVENOUS | Status: AC

## 2021-07-07 MED ORDER — SODIUM CHLORIDE 0.9% FLUSH
10.0000 mL | INTRAVENOUS | Status: DC | PRN
  Administered 2021-07-07: 10 mL

## 2021-07-07 MED ORDER — HEPARIN SOD (PORK) LOCK FLUSH 100 UNIT/ML IV SOLN
500.0000 [IU] | Freq: Once | INTRAVENOUS | Status: AC | PRN
  Administered 2021-07-07: 500 [IU]

## 2021-07-07 NOTE — Telephone Encounter (Signed)
I called pt at 12:11 regarding missed appointment with flush and to see if she was planning to come to her appointment with Wilber Bihari today.  Left VM for call back.

## 2021-07-07 NOTE — Patient Instructions (Signed)
[facility]  Discharge Instructions: ?Thank you for choosing [facility] to provide your oncology and hematology care.  ? ?If you have a lab appointment with the Cancer Center, please go directly to the Cancer Center and check in at the registration area. ?  ?Wear comfortable clothing and clothing appropriate for easy access to any Portacath or PICC line.  ? ?We strive to give you quality time with your provider. You may need to reschedule your appointment if you arrive late (15 or more minutes).  Arriving late affects you and other patients whose appointments are after yours.  Also, if you miss three or more appointments without notifying the office, you may be dismissed from the clinic at the provider?s discretion.    ?  ?For prescription refill requests, have your pharmacy contact our office and allow 72 hours for refills to be completed.   ? ?Today you received the following chemotherapy and/or immunotherapy agents: Keytruda ?  ?To help prevent nausea and vomiting after your treatment, we encourage you to take your nausea medication as directed. ? ?BELOW ARE SYMPTOMS THAT SHOULD BE REPORTED IMMEDIATELY: ?*FEVER GREATER THAN 100.4 F (38 ?C) OR HIGHER ?*CHILLS OR SWEATING ?*NAUSEA AND VOMITING THAT IS NOT CONTROLLED WITH YOUR NAUSEA MEDICATION ?*UNUSUAL SHORTNESS OF BREATH ?*UNUSUAL BRUISING OR BLEEDING ?*URINARY PROBLEMS (pain or burning when urinating, or frequent urination) ?*BOWEL PROBLEMS (unusual diarrhea, constipation, pain near the anus) ?TENDERNESS IN MOUTH AND THROAT WITH OR WITHOUT PRESENCE OF ULCERS (sore throat, sores in mouth, or a toothache) ?UNUSUAL RASH, SWELLING OR PAIN  ?UNUSUAL VAGINAL DISCHARGE OR ITCHING  ? ?Items with * indicate a potential emergency and should be followed up as soon as possible or go to the Emergency Department if any problems should occur. ? ?Please show the CHEMOTHERAPY ALERT CARD or IMMUNOTHERAPY ALERT CARD at check-in to the  Emergency Department and triage nurse. ? ?Should you have questions after your visit or need to cancel or reschedule your appointment, please contact  [facility]  Dept: [phone]  and follow the prompts.  Office hours are 8:00 a.m. to 4:30 p.m. Monday - Friday. Please note that voicemails left after 4:00 p.m. may not be returned until the following business day.  We are closed weekends and major holidays. You have access to a nurse at all times for urgent questions. Please call the main number to the clinic Dept: [phone] and follow the prompts. ? ? ?For any non-urgent questions, you may also contact your provider using MyChart. We now offer e-Visits for anyone 18 and older to request care online for non-urgent symptoms. For details visit [email or web address]. ?  ?Also download the MyChart app! Go to the app store, search "MyChart", open the app, select , and log in with your MyChart username and password. ? ?Due to Covid, a mask is required upon entering the hospital/clinic. If you do not have a mask, one will be given to you upon arrival. For doctor visits, patients may have 1 support person aged 18 or older with them. For treatment visits, patients cannot have anyone with them due to current Covid guidelines and our immunocompromised population.  ? ?

## 2021-07-07 NOTE — Progress Notes (Signed)
[place]  [phone]) [phone] Fax:([phone]) [phone]    ID: KYRSTYN GREEAR DOB: [birth year]  MR#: [id number]  VPX#:[id number]  Patient Care Team: [name], [place] as PCP - General (Family Medicine) [name], RN as Oncology Nurse Navigator [name], RN as Oncology Nurse Navigator Magrinat, [name], MD as Consulting Physician (Oncology) [name], MD as Consulting Physician (General Surgery) [name], MD as Consulting Physician (Radiation Oncology) [name], NP OTHER MD:  CHIEF COMPLAINT: recurrent vs. new triple negative breast cancer (s/p right mastectomy)  CURRENT TREATMENT: Continuing pembrolizumab   INTERVAL HISTORY: Jayna was scheduled today 03/17/2021 for follow up and treatment of her second triple negative breast cancer.   She continues on pembrolizumab every 21 days.  She continues to tolerate this well.  At her last visit she was having somewhat of a cough.  This is improved and she does take albuterol if needed.  She denies any current concerns.  She has completed her left-sided reconstruction.  She is continuing to heal.  She remains active with working full-time taking care of her teenage children and keeping up with her appointments.  This makes Tawana quite busy.   REVIEW OF SYSTEMS: Review of Systems  Constitutional:  Positive for fatigue. Negative for appetite change, chills, fever and unexpected weight change.  HENT:   Negative for hearing loss, lump/mass and trouble swallowing.   Eyes:  Negative for eye problems and icterus.  Respiratory:  Negative for chest tightness, cough and shortness of breath.   Cardiovascular:  Negative for chest pain, leg swelling and palpitations.  Gastrointestinal:  Negative for abdominal distention, abdominal pain, constipation, diarrhea, nausea and vomiting.  Endocrine: Negative for hot flashes.  Genitourinary:  Negative for difficulty urinating.   Musculoskeletal:   Negative for arthralgias.  Skin:  Negative for itching and rash.  Neurological:  Negative for dizziness, extremity weakness, headaches and numbness.  Hematological:  Negative for adenopathy. Does not bruise/bleed easily.  Psychiatric/Behavioral:  Negative for depression. The patient is not nervous/anxious.       COVID 19 VACCINATION STATUS: fully vaccinated [employer]), with booster 06/2020   HISTORY OF CURRENT ILLNESS: From the original intake note:  PREET MANGANO has a history of 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 She received 4 cycles of doxorubicin and cyclophosphamide followed by 4 cycles of of paclitaxel, which was discontinued secondary to neuropathy. She was released from follow up here in 2012.  More recently, she presented with a palpable upper-inner right breast mass. She underwent bilateral diagnostic mammography with tomography and right breast ultrasonography at [facility] on 09/22/2020 showing: breast density category A; 2.2 cm irregular mass in upper-inner right breast; no significant abnormalities in right axilla.  Accordingly on 09/22/2020 she proceeded to biopsy of the right breast area in question. The pathology from this procedure ([id number]) showed: invasive ductal carcinoma, grade 3. Prognostic indicators significant for: estrogen receptor, 0% negative and progesterone receptor, 0% negative. Proliferation marker Ki67 at 80%. HER2 negatve by immunohistochemistry (1+).  Cancer Staging Malignant neoplasm of upper-inner quadrant of right breast in female, estrogen receptor negative ([place]) Staging form: Breast, AJCC 8th Edition - Clinical stage from 09/29/2020: Stage IIB (rcT2, cN0, cM0, G3, ER-, PR-, HER2-) - Signed by [name], MD on 09/29/2020 Stage prefix: Recurrence  Malignant neoplasm of upper-outer quadrant of right breast in female, estrogen receptor negative  ([place]) Staging form: Breast, AJCC 8th Edition -  Clinical: cT2, cN1, cM0, ER-, PR-, HER2- - Signed by [name], MD on 09/29/2020 Stage prefix: Initial diagnosis Nuclear grade: G3  The patient's subsequent history is as detailed below.   PAST MEDICAL HISTORY: Past Medical History:  Diagnosis Date   Breast cancer ([place]) 2008   Right Breast Cancer   Breast cancer ([place]) 09/2020   Right Breast   Cancer ([place])    Family history of breast cancer    Family history of prostate cancer    Personal history of chemotherapy 2008   Right Breast Cancer   Personal history of radiation therapy 2008   Right Breast Cancer    PAST SURGICAL HISTORY: Past Surgical History:  Procedure Laterality Date   BREAST LUMPECTOMY Right 2008   CESAREAN SECTION     MASTECTOMY W/ SENTINEL NODE BIOPSY Right 02/24/2021   Procedure: RIGHT MASTECTOMY WITH RIGHT AXILLARY SENTINEL LYMPH NODE BIOPSY;  Surgeon: [name], MD;  Location: [facility];  Service: General;  Laterality: Right;   PORTACATH PLACEMENT Left 10/13/2020   Procedure: INSERTION PORT-A-CATH;  Surgeon: [name], MD;  Location: [facility];  Service: General;  Laterality: Left;  LEAVE PORT ACCESSED CHEMO STARTS ON 2/10, LEFT SIDE    FAMILY HISTORY: Family History  Problem Relation Age of Onset   Breast cancer Maternal Grandmother 78   Prostate cancer Maternal Grandfather        dx 76s, metastatic   Her father died from heart attack (possibly Covid?) at age 43. Her mother is age 2 as of 09/2020. Reem has 2 brothers and 2 sisters. She reports breast cancer in her maternal grandmother in her late 2's.   GYNECOLOGIC HISTORY:  No LMP recorded. Menarche: 50 years old Age at first live birth: 50 years old Bibo P 3 LMP 09/09/2020, periods are regular, 7 days with 4 heavy Contraceptive: s/p BTL with coils in place HRT n/a  Hysterectomy? no BSO? 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 Husband [name] is a Cabin crew. She lives at home with [name], daughter [name], age 70, and daughter [name], age 78 who works as a Curator. Son [name], age 37, is a Psychiatric nurse in [employer]. Lakayla has one grandchild. She attends [employer].    ADVANCED DIRECTIVES: In the absence of any documentation to the contrary, the patient's spouse is their HCPOA.    HEALTH MAINTENANCE: Social History   Tobacco Use   Smoking status: Never   Smokeless tobacco: Never  Vaping Use   Vaping Use: Never used  Substance Use Topics   Alcohol use: No   Drug use: No     Colonoscopy: n/a (age)   PAP: 2019  Bone density: n/a (age)   No Known Allergies  Current Outpatient Medications  Medication Sig Dispense Refill   albuterol (VENTOLIN HFA) 108 (90 Base) MCG/ACT inhaler Inhale 2 puffs into the lungs every 6 (six) hours as needed for wheezing or shortness of breath. 8 g 2   lidocaine-prilocaine (EMLA) cream Apply 1 application topically as needed (port access). 30 g 1   LORazepam (ATIVAN) 0.5 MG tablet Take one tablet before coming to cancer center for chemo treatment (Patient taking differently: Take 0.5 mg by mouth See admin instructions. Take one tablet before coming to cancer center for chemo treatment) 30 tablet 0   methocarbamol (ROBAXIN) 750 MG tablet Take 1 tablet (750 mg total) by mouth  every 8 (eight) hours as needed (use for muscle cramps/pain). 30 tablet 2   traMADol (ULTRAM) 50 MG tablet Take 1 tablet (50 mg total) by mouth every 6 (six) hours as needed. (Patient taking differently: Take 50 mg by mouth every 6 (six) hours as needed for severe pain.) 8 tablet 0   No current facility-administered medications for this visit.    OBJECTIVE:   Vitals:   05/26/21 1346  BP: 136/80  Pulse: 92  Resp: 18  Temp: 97.9 F (36.6 C)  SpO2: 100%      Body mass index is 29.12 kg/m.   Wt Readings from Last 3 Encounters:   05/26/21 164 lb 6.4 oz (74.6 kg)  04/21/21 164 lb 8 oz (74.6 kg)  03/31/21 163 lb 12.8 oz (74.3 kg)     ECOG FS:1 - Symptomatic but completely ambulatory GENERAL: Patient is a well appearing female in no acute distress HEENT:  Sclerae anicteric.  Oropharynx clear and moist. No ulcerations or evidence of oropharyngeal candidiasis. Neck is supple.  NODES:  No cervical, supraclavicular, or axillary lymphadenopathy palpated.  BREAST EXAM:  Deferred. LUNGS:  Mild expiratory wheeze noted, otherwise clear to auscultation bilaterally.  No rhonchi. HEART:  Regular rate and rhythm. No murmur appreciated. ABDOMEN:  Soft, nontender.  Positive, normoactive bowel sounds. No organomegaly palpated. MSK:  No focal spinal tenderness to palpation. Full range of motion bilaterally in the upper extremities. EXTREMITIES:  No peripheral edema.   SKIN:  Clear with no obvious rashes or skin changes. No nail dyscrasia. NEURO:  Nonfocal. Well oriented.  Appropriate affect.      LAB RESULTS:  CMP     Component Value Date/Time   NA 139 05/26/2021 1318   K 3.8 05/26/2021 1318   CL 106 05/26/2021 1318   CO2 24 05/26/2021 1318   GLUCOSE 81 05/26/2021 1318   BUN 8 05/26/2021 1318   CREATININE 0.70 05/26/2021 1318   CREATININE 0.71 10/28/2020 1135   CALCIUM 9.0 05/26/2021 1318   PROT 7.2 05/26/2021 1318   ALBUMIN 3.6 05/26/2021 1318   AST 14 (L) 05/26/2021 1318   AST 18 10/28/2020 1135   ALT 9 05/26/2021 1318   ALT 20 10/28/2020 1135   ALKPHOS 90 05/26/2021 1318   BILITOT 0.2 (L) 05/26/2021 1318   BILITOT 0.3 10/28/2020 1135   GFRNONAA >60 05/26/2021 1318   GFRNONAA >60 10/28/2020 1135   GFRAA  04/11/2007 1225    >60        The eGFR has been calculated using the MDRD equation. This calculation has not been validated in all clinical    No results found for: TOTALPROTELP, ALBUMINELP, A1GS, A2GS, BETS, BETA2SER, GAMS, MSPIKE, SPEI  Lab Results  Component Value Date   WBC 5.7 05/26/2021    NEUTROABS 2.8 05/26/2021   HGB 10.2 (L) 05/26/2021   HCT 31.5 (L) 05/26/2021   MCV 88.2 05/26/2021   PLT 221 05/26/2021    Lab Results  Component Value Date   LABCA2 17 11/25/2007    No components found for: [id number]  No results for input(s): INR in the last 168 hours.  Lab Results  Component Value Date   LABCA2 17 11/25/2007    No results found for: WUJ811  No results found for: BJY782  No results found for: NFA213  No results found for: CA2729  No components found for: HGQUANT  No results found for: CEA1 / No results found for: CEA1   No results found for: AFPTUMOR  No results  found for: CHROMOGRNA  No results found for: KPAFRELGTCHN, LAMBDASER, KAPLAMBRATIO (kappa/lambda light chains)  No results found for: HGBA, HGBA2QUANT, HGBFQUANT, HGBSQUAN (Hemoglobinopathy evaluation)   Lab Results  Component Value Date   LDH 156 11/25/2007    No results found for: IRON, TIBC, IRONPCTSAT (Iron and TIBC)  No results found for: FERRITIN  Urinalysis    Component Value Date/Time   LABSPEC 1.015 03/13/2017 1905   PHURINE 7.0 03/13/2017 1905   GLUCOSEU NEGATIVE 03/13/2017 1905   HGBUR MODERATE (A) 03/13/2017 1905   BILIRUBINUR NEGATIVE 03/13/2017 1905   KETONESUR NEGATIVE 03/13/2017 1905   PROTEINUR NEGATIVE 03/13/2017 1905   UROBILINOGEN 0.2 03/13/2017 1905   NITRITE NEGATIVE 03/13/2017 1905   LEUKOCYTESUR MODERATE (A) 03/13/2017 1905    STUDIES: DG Chest 2 View  Result Date: 05/26/2021 CLINICAL DATA:  Cough EXAM: CHEST - 2 VIEW COMPARISON:  Chest x-ray dated May 22, 2007 FINDINGS: Left chest wall port with tip near the superior cavoatrial junction. The heart size and mediastinal contours are within normal limits. Both lungs are clear. The visualized skeletal structures are unremarkable. IMPRESSION: No active cardiopulmonary disease. Electronically Signed   By: Yetta Glassman M.D.   On: 05/26/2021 16:53      ELIGIBLE FOR AVAILABLE RESEARCH  PROTOCOL: no  ASSESSMENT: 50 y.o. [place] woman  (1) status post right breast upper outer quadrant lumpectomy and sentinel lymph node sampling 04/15/2007 for a pT2 pN1, stage IIIB invasive ductal carcinoma, grade 3, triple negative, with an MIB-1 of 59%  (a) adjuvant chemotherapy consisted of doxorubicin and cyclophosphamide in dose dense fashion x4 followed by weekly paclitaxel x12  (b) received adjuvant radiation  (c) a total of 4 right axillary lymph nodes were removed  (2) status post right breast upper inner quadrant biopsy 09/22/2020 for a clinical T2 N2, stage IIIC invasive ductal carcinoma, grade 3, triple negative, with an MIB-1 of 80%.  (a) chest CT 10/28/2020 shows indeterminate 0.3 cm or smaller pulmonary nodules  (b) bone scan 10/29/2020 shows no evidence of metatstatic disease  (3) genetics testing 10/19/2020 through the Common hereditary Cancers panel offered by [facility] shows no deleterious mutations  (4) neoadjuvant chemotherapy started 10/14/2020 consisting of pembrolizumab given every 3 weeks, with carboplatin + paclitaxel given weekly, 3 weeks on, one week off, wityh 4 cycles planned (12 carbo/Taxol doses), last dose 12/17/2020  (a) final 4 cycles of chemotherapy omitted because of neuropathy  (b) breast MRI 12/24/2020 shows near complete resolution of malignancy  (5) status post right mastectomy and sentinel lymph node sampling 02/24/2021 showing a residual ypT1a ypN0 invasive ductal carcinoma, grade 3, with ample margins  (a) a single right axillary lymph node was removed  (6) to continue pembrolizumab to total one year (9 post-op doses)   PLAN:  Asianna is here today to continue on treatment with Pembrolizumab.  She continues to tolerate her treatment well.  She came to her appointment late therefore we are unsure if the treatment room has a spot to give her her treatment this afternoon.  She has not had labs drawn at the time of appointment completion.  My  nurse took Eartha to the lab.  At the time of appointment clinic completion we had not heard back from our infusion staff as to whether she could proceed with therapy today.  Based on her exam and office visit today she is okay to proceed with therapy so long as her labs are within parameters.  We discussed the fact that Dr. Jana Hakim is retiring.  She will return in 3 weeks for labs and an infusion and then in 6 weeks for labs follow-up with Dr. Jana Hakim and her infusion.  Total encounter time: 20 minutes in face to face visit time, chart review, lab review, order entry, and documentation of the encounter.   Wilber Bihari, NP 05/28/21 11:31 AM Medical Oncology and Hematology [facility] [place], [place] [address] Tel. [phone]    Fax. [phone]   *Total Encounter Time as defined by the Centers for Medicare and Medicaid Services includes, in addition to the face-to-face time of a patient visit (documented in the note above) non-face-to-face time: obtaining and reviewing outside history, ordering and reviewing medications, tests or procedures, care coordination (communications with other health care professionals or caregivers) and documentation in the medical record.

## 2021-07-29 ENCOUNTER — Inpatient Hospital Stay: Payer: 59

## 2021-07-29 ENCOUNTER — Other Ambulatory Visit: Payer: Self-pay

## 2021-07-29 VITALS — BP 127/80 | HR 86 | Temp 98.2°F | Resp 16 | Wt 161.0 lb

## 2021-07-29 DIAGNOSIS — C50211 Malignant neoplasm of upper-inner quadrant of right female breast: Secondary | ICD-10-CM | POA: Diagnosis not present

## 2021-07-29 DIAGNOSIS — Z171 Estrogen receptor negative status [ER-]: Secondary | ICD-10-CM

## 2021-07-29 DIAGNOSIS — Z95828 Presence of other vascular implants and grafts: Secondary | ICD-10-CM

## 2021-07-29 LAB — CBC WITH DIFFERENTIAL/PLATELET
Abs Immature Granulocytes: 0.03 10*3/uL (ref 0.00–0.07)
Basophils Absolute: 0 10*3/uL (ref 0.0–0.1)
Basophils Relative: 1 %
Eosinophils Absolute: 0.3 10*3/uL (ref 0.0–0.5)
Eosinophils Relative: 5 %
HCT: 32.2 % — ABNORMAL LOW (ref 36.0–46.0)
Hemoglobin: 10.1 g/dL — ABNORMAL LOW (ref 12.0–15.0)
Immature Granulocytes: 1 %
Lymphocytes Relative: 41 %
Lymphs Abs: 2.5 10*3/uL (ref 0.7–4.0)
MCH: 27.7 pg (ref 26.0–34.0)
MCHC: 31.4 g/dL (ref 30.0–36.0)
MCV: 88.2 fL (ref 80.0–100.0)
Monocytes Absolute: 0.5 10*3/uL (ref 0.1–1.0)
Monocytes Relative: 8 %
Neutro Abs: 2.8 10*3/uL (ref 1.7–7.7)
Neutrophils Relative %: 44 %
Platelets: 260 10*3/uL (ref 150–400)
RBC: 3.65 MIL/uL — ABNORMAL LOW (ref 3.87–5.11)
RDW: 13.6 % (ref 11.5–15.5)
WBC: 6.1 10*3/uL (ref 4.0–10.5)
nRBC: 0 % (ref 0.0–0.2)

## 2021-07-29 LAB — COMPREHENSIVE METABOLIC PANEL
ALT: 12 U/L (ref 0–44)
AST: 18 U/L (ref 15–41)
Albumin: 3.7 g/dL (ref 3.5–5.0)
Alkaline Phosphatase: 100 U/L (ref 38–126)
Anion gap: 8 (ref 5–15)
BUN: 9 mg/dL (ref 6–20)
CO2: 24 mmol/L (ref 22–32)
Calcium: 8.8 mg/dL — ABNORMAL LOW (ref 8.9–10.3)
Chloride: 106 mmol/L (ref 98–111)
Creatinine, Ser: 0.69 mg/dL (ref 0.44–1.00)
GFR, Estimated: >60 mL/min (ref >60)
Glucose, Bld: 75 mg/dL (ref 70–99)
Potassium: 3.7 mmol/L (ref 3.5–5.1)
Sodium: 138 mmol/L (ref 135–145)
Total Bilirubin: 0.3 mg/dL (ref 0.3–1.2)
Total Protein: 7.3 g/dL (ref 6.5–8.1)

## 2021-07-29 MED ORDER — SODIUM CHLORIDE 0.9% FLUSH
10.0000 mL | Freq: Once | INTRAVENOUS | Status: AC
  Administered 2021-07-29: 10 mL

## 2021-07-29 MED ORDER — SODIUM CHLORIDE 0.9 % IV SOLN: Freq: Once | INTRAVENOUS | Status: AC

## 2021-07-29 MED ORDER — SODIUM CHLORIDE 0.9 % IV SOLN
200.0000 mg | Freq: Once | INTRAVENOUS | Status: AC
  Administered 2021-07-29: 200 mg via INTRAVENOUS
  Filled 2021-07-29: qty 8

## 2021-07-29 MED ORDER — SODIUM CHLORIDE 0.9% FLUSH: 10.0000 mL | INTRAVENOUS | Status: DC | PRN

## 2021-07-29 MED ORDER — HEPARIN SOD (PORK) LOCK FLUSH 100 UNIT/ML IV SOLN: 500.0000 [IU] | Freq: Once | INTRAVENOUS | Status: DC | PRN

## 2021-07-29 NOTE — Patient Instructions (Signed)
[facility] MEDICAL [facility]COLOG[facility]  Discharge Instructions: °Thank you for choosing [facility] to provide your oncology and hematology care.  ° °If you have a lab appointment with the Cancer Center, please go directly to the Cancer Center and check in at the registration area. °  °Wear comfortable clothing and clothing appropriate for easy access to any Portacath or PICC line.  ° °We strive to give you quality time with your provider. You may need to reschedule your appointment if you arrive late (15 or more minutes).  Arriving late affects you and other patients whose appointments are after yours.  Also, if you miss three or more appointments without notifying the office, you may be dismissed from the clinic at the provider’s discretion.    °  °For prescription refill requests, have your pharmacy contact our office and allow 72 hours for refills to be completed.   ° °Today you received the following chemotherapy and/or immunotherapy agent: Pembrolizumab (Keytruda) °  °To help prevent nausea and vomiting after your treatment, we encourage you to take your nausea medication as directed. ° °BELOW ARE SYMPTOMS THAT SHOULD BE REPORTED IMMEDIATELY: °*FEVER GREATER THAN 100.4 F (38 °C) OR HIGHER °*CHILLS OR SWEATING °*NAUSEA AND VOMITING THAT IS NOT CONTROLLED WITH YOUR NAUSEA MEDICATION °*UNUSUAL SHORTNESS OF BREATH °*UNUSUAL BRUISING OR BLEEDING °*URINARY PROBLEMS (pain or burning when urinating, or frequent urination) °*BOWEL PROBLEMS (unusual diarrhea, constipation, pain near the anus) °TENDERNESS IN MOUTH AND THROAT WITH OR WITHOUT PRESENCE OF ULCERS (sore throat, sores in mouth, or a toothache) °UNUSUAL RASH, SWELLING OR PAIN  °UNUSUAL VAGINAL DISCHARGE OR ITCHING  ° °Items with * indicate a potential emergency and should be followed up as soon as possible or go to the Emergency Department if any problems should occur. ° °Please show the CHEMOTHERAPY ALERT CARD or IMMUNOTHERAPY ALERT CARD at  check-in to the Emergency Department and triage nurse. ° °Should you have questions after your visit or need to cancel or reschedule your appointment, please contact [facility]  Dept: [phone]  and follow the prompts.  Office hours are 8:00 a.m. to 4:30 p.m. Monday - Friday. Please note that voicemails left after 4:00 p.m. may not be returned until the following business day.  We are closed weekends and major holidays. You have access to a nurse at all times for urgent questions. Please call the main number to the clinic Dept: [phone] and follow the prompts. ° ° °For any non-urgent questions, you may also contact your provider using MyChart. We now offer e-Visits for anyone 18 and older to request care online for non-urgent symptoms. For details visit [email or web address]. °  °Also download the MyChart app! Go to the app store, search "MyChart", open the app, select [facility], and log in with your MyChart username and password. ° °Due to Covid, a mask is required upon entering the hospital/clinic. If you do not have a mask, one will be given to you upon arrival. For doctor visits, patients may have 1 support person aged 18 or older with them. For treatment visits, patients cannot have anyone with them due to current Covid guidelines and our immunocompromised population.  ° °

## 2021-08-10 ENCOUNTER — Other Ambulatory Visit: Payer: 59

## 2021-08-10 ENCOUNTER — Ambulatory Visit: Payer: 59

## 2021-08-10 ENCOUNTER — Encounter: Payer: Self-pay | Admitting: *Deleted

## 2021-08-10 ENCOUNTER — Ambulatory Visit: Payer: 59 | Admitting: Oncology

## 2021-08-18 ENCOUNTER — Encounter: Payer: Self-pay | Admitting: Adult Health

## 2021-08-18 ENCOUNTER — Inpatient Hospital Stay: Payer: 59

## 2021-08-18 ENCOUNTER — Other Ambulatory Visit: Payer: Self-pay

## 2021-08-18 ENCOUNTER — Inpatient Hospital Stay: Payer: 59 | Attending: Oncology

## 2021-08-18 ENCOUNTER — Inpatient Hospital Stay (HOSPITAL_BASED_OUTPATIENT_CLINIC_OR_DEPARTMENT_OTHER): Payer: 59 | Admitting: Adult Health

## 2021-08-18 VITALS — BP 119/77 | HR 99 | Temp 97.4°F | Resp 18 | Ht 62.0 in | Wt 164.5 lb

## 2021-08-18 DIAGNOSIS — C50411 Malignant neoplasm of upper-outer quadrant of right female breast: Secondary | ICD-10-CM

## 2021-08-18 DIAGNOSIS — Z171 Estrogen receptor negative status [ER-]: Secondary | ICD-10-CM | POA: Insufficient documentation

## 2021-08-18 DIAGNOSIS — C50211 Malignant neoplasm of upper-inner quadrant of right female breast: Secondary | ICD-10-CM

## 2021-08-18 DIAGNOSIS — Z95828 Presence of other vascular implants and grafts: Secondary | ICD-10-CM

## 2021-08-18 MED ORDER — SODIUM CHLORIDE 0.9% FLUSH
10.0000 mL | Freq: Once | INTRAVENOUS | Status: AC
  Administered 2021-08-18: 10 mL

## 2021-08-18 MED ORDER — ALTEPLASE 2 MG IJ SOLR
2.0000 mg | Freq: Once | INTRAMUSCULAR | Status: AC
  Administered 2021-08-18: 2 mg
  Filled 2021-08-18: qty 2

## 2021-08-18 NOTE — Progress Notes (Signed)
Patient came in for port flush with lab.  Blood return noted but not enough for lab work.  Offered to make a lab appointment for peripheral labs and patient stated "I had a bad experience."  Cathflo was given by Delle Reining, RN @ [phone].

## 2021-08-18 NOTE — Progress Notes (Signed)
[place]  [phone] [phone]    ID: Penny Brown DOB: Aug 12, 1971  MR#: [id number]  GYI#:[id number]  Patient Care Team: [name], [place] as PCP - General (Family Medicine) [name], MD as Consulting Physician (General Surgery) [name], MD as Consulting Physician (Radiation Oncology) Benay Pike, MD as Consulting Physician (Hematology and Oncology) [name], NP OTHER MD:  CHIEF COMPLAINT: recurrent vs. new triple negative breast cancer (s/p right mastectomy)  CURRENT TREATMENT: Continuing pembrolizumab   INTERVAL HISTORY: Penny Brown was scheduled today 03/17/2021 for follow up and treatment of her second triple negative breast cancer.   She continues on pembrolizumab every 21 days.  She is tolerating this well.  She denies any current issues.  She is working.  She notes that her left breast has had issues with the reconstruction and reduction.  She has an open wound that she is placing wet-to-dry dressing and packing.  She has gone to [facility] for an opinion about right breast reconstruction status post her mastectomy.  She is not going to undergo scanning to evaluate the vasculature in her abdomen she is considering DIEP flap reconstruction.  Unfortunately they were unable to get a blood return from her port today.  They tried multiple times to do this and were unsuccessful.  She does not want to get an IV placed because she had bad experiences with blown veins the last time this happened.   REVIEW OF SYSTEMS: Review of Systems  Constitutional:  Positive for fatigue. Negative for appetite change, chills, fever and unexpected weight change.  HENT:   Negative for hearing loss, lump/mass and trouble swallowing.   Eyes:  Negative for eye problems and icterus.  Respiratory:  Negative for chest tightness, cough and shortness of breath.   Cardiovascular:  Negative for chest pain, leg swelling and palpitations.   Gastrointestinal:  Negative for abdominal distention, abdominal pain, constipation, diarrhea, nausea and vomiting.  Endocrine: Negative for hot flashes.  Genitourinary:  Negative for difficulty urinating.   Musculoskeletal:  Negative for arthralgias.  Skin:  Positive for wound. Negative for itching and rash.  Neurological:  Negative for dizziness, extremity weakness, headaches and numbness.  Hematological:  Negative for adenopathy. Does not bruise/bleed easily.  Psychiatric/Behavioral:  Negative for depression. The patient is not nervous/anxious.       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 She received 4 cycles of doxorubicin and cyclophosphamide followed by 4 cycles of of paclitaxel, which was discontinued secondary to neuropathy. She was released from follow up here in 2012.  More recently, she presented with a palpable upper-inner right breast mass. She underwent bilateral diagnostic mammography with tomography and right breast ultrasonography at [facility] on 09/22/2020 showing: breast density category A; 2.2 cm irregular mass in upper-inner right breast; no significant abnormalities in right axilla.  Accordingly on 09/22/2020 she proceeded to biopsy of the right breast area in question. The pathology from this procedure ([id number]) showed: invasive ductal carcinoma, grade 3. Prognostic indicators significant for: estrogen receptor, 0% negative and progesterone receptor, 0% negative. Proliferation marker Ki67 at 80%. HER2 negatve by immunohistochemistry (1+).   Cancer Staging  Malignant neoplasm of upper-inner quadrant of right breast in female, estrogen receptor negative ([place]) Staging form: Breast, AJCC 8th Edition - Clinical  stage from 09/29/2020: Stage IIB (rcT2, cN0, cM0, G3, ER-, PR-, HER2-) - Signed by [name], MD on 09/29/2020 Stage prefix: Recurrence  Malignant neoplasm of upper-outer quadrant of right breast in female, estrogen receptor negative ([place]) Staging form: Breast, AJCC 8th Edition - Clinical: cT2, cN1, cM0, ER-, PR-, HER2- - Signed by [name], MD on 09/29/2020 Stage prefix: Initial diagnosis Nuclear grade: G3  The patient's subsequent history is as detailed below.   PAST MEDICAL HISTORY: Past Medical History:  Diagnosis Date   Breast cancer ([place]) 2008   Right Breast Cancer   Breast cancer ([place]) 09/2020   Right Breast   Cancer ([place])    Family history of breast cancer    Family history of prostate cancer    Personal history of chemotherapy 2008   Right Breast Cancer   Personal history of radiation therapy 2008   Right Breast Cancer    PAST SURGICAL HISTORY: Past Surgical History:  Procedure Laterality Date   BREAST LUMPECTOMY Right 2008   BREAST REDUCTION SURGERY Left 06/03/2021   Procedure: MAMMARY REDUCTION  (BREAST);  Surgeon: Irene Limbo, MD;  Location: [facility];  Service: Plastics;  Laterality: Left;   CESAREAN SECTION     MASTECTOMY W/ SENTINEL NODE BIOPSY Right 02/24/2021   Procedure: RIGHT MASTECTOMY WITH RIGHT AXILLARY SENTINEL LYMPH NODE BIOPSY;  Surgeon: [name], MD;  Location: [facility];  Service: General;  Laterality: Right;   PORTACATH PLACEMENT Left 10/13/2020   Procedure: INSERTION PORT-A-CATH;  Surgeon: [name], MD;  Location: [facility];  Service: General;  Laterality: Left;  LEAVE PORT ACCESSED CHEMO STARTS ON 2/10, LEFT SIDE    FAMILY HISTORY: Family History  Problem Relation Age of Onset   Breast cancer Maternal Grandmother 78   Prostate cancer Maternal Grandfather        dx 85s, metastatic   Her father died from heart attack (possibly Covid?) at age 76. Her mother is age 103 as of  09/2020. Penny Brown has 2 brothers and 2 sisters. She reports breast cancer in her maternal grandmother in her late 76's.   GYNECOLOGIC HISTORY:  No LMP recorded. Menarche: 50 years old Age at first live birth: 50 years old [place] P 3 LMP 09/09/2020, periods are regular, 7 days with 4 heavy Contraceptive: s/p BTL with coils in place HRT n/a  Hysterectomy? no BSO? 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 Husband [name] is a Cabin crew. She lives at home with [name], daughter [name], age 54, and daughter [name], age 79 who works as a Curator. Son [name], age 80, is a Psychiatric nurse in [employer]. Arella has one grandchild. She attends [employer].    ADVANCED DIRECTIVES: In the absence of any documentation to the contrary, the patient's spouse is their HCPOA.    HEALTH MAINTENANCE: Social History   Tobacco Use   Smoking status: Never   Smokeless tobacco: Never  Vaping Use   Vaping Use: Never used  Substance Use Topics   Alcohol use: No   Drug use: No     Colonoscopy: n/a (age)   PAP: 2019  Bone density: n/a (age)   No Known Allergies  Current Outpatient Medications  Medication Sig Dispense Refill   albuterol (VENTOLIN HFA) 108 (90 Base) MCG/ACT inhaler Inhale 2 puffs into the lungs every 6 (six) hours as needed for wheezing or shortness of breath.  8 g 2   lidocaine-prilocaine (EMLA) cream Apply 1 application topically as needed (port access). 30 g 1   LORazepam (ATIVAN) 0.5 MG tablet Take one tablet before coming to cancer center for chemo treatment (Patient taking differently: Take 0.5 mg by mouth See admin instructions. Take one tablet before coming to cancer center for chemo treatment) 30 tablet 0   methocarbamol (ROBAXIN) 750 MG tablet Take 1 tablet (750 mg total) by mouth every 8 (eight) hours as needed (use for muscle cramps/pain). 30 tablet 2   traMADol (ULTRAM) 50 MG  tablet Take 1 tablet (50 mg total) by mouth every 6 (six) hours as needed. (Patient taking differently: Take 50 mg by mouth every 6 (six) hours as needed for severe pain.) 8 tablet 0   No current facility-administered medications for this visit.    OBJECTIVE:   Vitals:   08/18/21 1429  BP: 119/77  Pulse: 99  Resp: 18  Temp: (!) 97.4 F (36.3 C)  SpO2: 100%      Body mass index is 30.09 kg/m.   Wt Readings from Last 3 Encounters:  08/18/21 164 lb 8 oz (74.6 kg)  07/29/21 161 lb (73 kg)  07/07/21 162 lb (73.5 kg)     ECOG FS:1 - Symptomatic but completely ambulatory GENERAL: Patient is a well appearing female in no acute distress HEENT:  Sclerae anicteric.  Oropharynx clear and moist. No ulcerations or evidence of oropharyngeal candidiasis. Neck is supple.  NODES:  No cervical, supraclavicular, or axillary lymphadenopathy palpated.  BREAST EXAM: Right mastectomy is without sign of recurrence left breast is covered with gauze packing is in place there is no sign of infection but it is swollen. LUNGS:  Mild expiratory wheeze noted, otherwise clear to auscultation bilaterally.  No rhonchi. HEART:  Regular rate and rhythm. No murmur appreciated. ABDOMEN:  Soft, nontender.  Positive, normoactive bowel sounds. No organomegaly palpated. MSK:  No focal spinal tenderness to palpation. Full range of motion bilaterally in the upper extremities. EXTREMITIES:  No peripheral edema.   SKIN:  Clear with no obvious rashes or skin changes. No nail dyscrasia. NEURO:  Nonfocal. Well oriented.  Appropriate affect.      LAB RESULTS:  CMP     Component Value Date/Time   NA 138 07/29/2021 1325   K 3.7 07/29/2021 1325   CL 106 07/29/2021 1325   CO2 24 07/29/2021 1325   GLUCOSE 75 07/29/2021 1325   BUN 9 07/29/2021 1325   CREATININE 0.69 07/29/2021 1325   CREATININE 0.71 10/28/2020 1135   CALCIUM 8.8 (L) 07/29/2021 1325   PROT 7.3 07/29/2021 1325   ALBUMIN 3.7 07/29/2021 1325   AST 18  07/29/2021 1325   AST 18 10/28/2020 1135   ALT 12 07/29/2021 1325   ALT 20 10/28/2020 1135   ALKPHOS 100 07/29/2021 1325   BILITOT 0.3 07/29/2021 1325   BILITOT 0.3 10/28/2020 1135   GFRNONAA >60 07/29/2021 1325   GFRNONAA >60 10/28/2020 1135   GFRAA  04/11/2007 1225    >60        The eGFR has been calculated using the MDRD equation. This calculation has not been validated in all clinical    No results found for: TOTALPROTELP, ALBUMINELP, A1GS, A2GS, BETS, BETA2SER, GAMS, MSPIKE, SPEI  Lab Results  Component Value Date   WBC 6.1 07/29/2021   NEUTROABS 2.8 07/29/2021   HGB 10.1 (L) 07/29/2021   HCT 32.2 (L) 07/29/2021   MCV 88.2 07/29/2021   PLT 260 07/29/2021  Lab Results  Component Value Date   LABCA2 17 11/25/2007    No components found for: TMAUQJ335  No results for input(s): INR in the last 168 hours.  Lab Results  Component Value Date   LABCA2 17 11/25/2007    No results found for: KTG256  No results found for: LSL373  No results found for: SKA768  No results found for: CA2729  No components found for: HGQUANT  No results found for: CEA1 / No results found for: CEA1   No results found for: AFPTUMOR  No results found for: CHROMOGRNA  No results found for: KPAFRELGTCHN, LAMBDASER, KAPLAMBRATIO (kappa/lambda light chains)  No results found for: HGBA, HGBA2QUANT, HGBFQUANT, HGBSQUAN (Hemoglobinopathy evaluation)   Lab Results  Component Value Date   LDH 156 11/25/2007    No results found for: IRON, TIBC, IRONPCTSAT (Iron and TIBC)  No results found for: FERRITIN  Urinalysis    Component Value Date/Time   LABSPEC 1.015 03/13/2017 1905   PHURINE 7.0 03/13/2017 1905   GLUCOSEU NEGATIVE 03/13/2017 1905   HGBUR MODERATE (A) 03/13/2017 1905   BILIRUBINUR NEGATIVE 03/13/2017 [place] 03/13/2017 1905   PROTEINUR NEGATIVE 03/13/2017 1905   UROBILINOGEN 0.2 03/13/2017 1905   NITRITE NEGATIVE 03/13/2017 1905    LEUKOCYTESUR MODERATE (A) 03/13/2017 1905    STUDIES: No results found.    ELIGIBLE FOR AVAILABLE RESEARCH PROTOCOL: no  ASSESSMENT: 50 y.o. [place] woman  (1) status post right breast upper outer quadrant lumpectomy and sentinel lymph node sampling 04/15/2007 for a pT2 pN1, stage IIIB invasive ductal carcinoma, grade 3, triple negative, with an MIB-1 of 59%  (a) adjuvant chemotherapy consisted of doxorubicin and cyclophosphamide in dose dense fashion x4 followed by weekly paclitaxel x12  (b) received adjuvant radiation  (c) a total of 4 right axillary lymph nodes were removed  (2) status post right breast upper inner quadrant biopsy 09/22/2020 for a clinical T2 N2, stage IIIC invasive ductal carcinoma, grade 3, triple negative, with an MIB-1 of 80%.  (a) chest CT 10/28/2020 shows indeterminate 0.3 cm or smaller pulmonary nodules  (b) bone scan 10/29/2020 shows no evidence of metatstatic disease  (3) genetics testing 10/19/2020 through the Common hereditary Cancers panel offered by [facility] shows no deleterious mutations  (4) neoadjuvant chemotherapy started 10/14/2020 consisting of pembrolizumab given every 3 weeks, with carboplatin + paclitaxel given weekly, 3 weeks on, one week off, wityh 4 cycles planned (12 carbo/Taxol doses), last dose 12/17/2020  (a) final 4 cycles of chemotherapy omitted because of neuropathy  (b) breast MRI 12/24/2020 shows near complete resolution of malignancy  (5) status post right mastectomy and sentinel lymph node sampling 02/24/2021 showing a residual ypT1a ypN0 invasive ductal carcinoma, grade 3, with ample margins  (a) a single right axillary lymph node was removed  (6) to continue pembrolizumab to total one year (9 post-op doses)   PLAN:  Penny Brown continues on pembrolizumab every 3 weeks with good tolerance.  She is clinically without any signs of breast cancer recurrence.  She will continue on pembrolizumab to complete 1 year of treatment which  will occur in February, 2023.    She met with Dr. Jana Hakim today to discuss her next oncologist.  She will stay with Dr. Chryl Heck.  I reviewed with Tawana her breast imaging.  She will undergo mammogram of her left breast once her reduction area is healed and has had several months to heal.  Had everything healed it would be 6 months from the time of  her surgery which was in September so that we make her mammogram be in March.  I am thinking that it will probably be around July or August that she will be able to have her mammogram.  Penny Brown will not received treatment today since we cannot get blood return from her port, we cannot use it and also we cannot treat her without her labs.  I have requested a dye study for her to go to and my nurse Val will work with her on getting this scheduled and getting her treatment rescheduled at a time that meets her needs.  Penny Brown will return every 3 weeks for her pembrolizumab and in 6 weeks she will see Dr. Chryl Heck.  Total encounter time: 30 minutes in face to face visit time, chart review, lab review, order entry, and documentation of the encounter.   Wilber Bihari, NP 08/18/21 3:31 PM Medical Oncology and Hematology [facility] [place], [place] [address] Tel. [phone]    Fax. [phone]   *Total Encounter Time as defined by the Centers for Medicare and Medicaid Services includes, in addition to the face-to-face time of a patient visit (documented in the note above) non-face-to-face time: obtaining and reviewing outside history, ordering and reviewing medications, tests or procedures, care coordination (communications with other health care professionals or caregivers) and documentation in the medical record.

## 2021-08-21 ENCOUNTER — Other Ambulatory Visit: Payer: Self-pay

## 2021-08-25 ENCOUNTER — Other Ambulatory Visit: Payer: Self-pay

## 2021-08-25 ENCOUNTER — Encounter: Payer: Self-pay | Admitting: Oncology

## 2021-08-25 ENCOUNTER — Ambulatory Visit (HOSPITAL_COMMUNITY)
Admission: RE | Admit: 2021-08-25 | Discharge: 2021-08-25 | Disposition: A | Discharge status: Home / Self Care | Payer: 59 | Source: Ambulatory Visit | Attending: Adult Health | Admitting: Adult Health

## 2021-08-25 DIAGNOSIS — Z452 Encounter for adjustment and management of vascular access device: Secondary | ICD-10-CM | POA: Diagnosis not present

## 2021-08-25 DIAGNOSIS — C50211 Malignant neoplasm of upper-inner quadrant of right female breast: Secondary | ICD-10-CM | POA: Diagnosis not present

## 2021-08-25 DIAGNOSIS — Z171 Estrogen receptor negative status [ER-]: Secondary | ICD-10-CM | POA: Insufficient documentation

## 2021-08-25 HISTORY — PX: IR CV LINE INJECTION: IMG2294

## 2021-08-25 MED ORDER — IOHEXOL 300 MG/ML  SOLN
25.0000 mL | Freq: Once | INTRAMUSCULAR | Status: AC | PRN
  Administered 2021-08-25: 09:00:00 15 mL via INTRAVENOUS

## 2021-08-25 MED ORDER — HEPARIN SOD (PORK) LOCK FLUSH 100 UNIT/ML IV SOLN
INTRAVENOUS | Status: AC
  Filled 2021-08-25: qty 5

## 2021-08-25 MED ORDER — HEPARIN SOD (PORK) LOCK FLUSH 100 UNIT/ML IV SOLN
INTRAVENOUS | Status: DC | PRN
  Administered 2021-08-25: 500 [IU]

## 2021-08-25 NOTE — Progress Notes (Signed)
This note is to clarify the episode resulting in cathflo on 12/15.  This nurse did not instill or pull back from pt port.  Maygan Janalyn Harder RN attempted after awaiting the specified time of 30 minutes.  Both myself and Maygan were in the room together, she did not get return blood flow and patient was not willing to wait the additional time to allow cathflo to dwell.  Pt also declined peripheral stick for labs/treatment.  Pt had many scheduling conflicts for the dye study but was ultimately able to find a day and time that worked.

## 2021-09-01 ENCOUNTER — Ambulatory Visit: Payer: 59

## 2021-09-01 ENCOUNTER — Other Ambulatory Visit: Payer: 59

## 2021-09-08 ENCOUNTER — Other Ambulatory Visit: Payer: Self-pay

## 2021-09-08 ENCOUNTER — Inpatient Hospital Stay: Payer: 59

## 2021-09-08 ENCOUNTER — Inpatient Hospital Stay: Payer: 59 | Attending: Oncology

## 2021-09-08 ENCOUNTER — Encounter: Payer: Self-pay | Admitting: *Deleted

## 2021-09-08 VITALS — BP 120/84 | HR 97 | Temp 98.0°F | Resp 18 | Wt 163.5 lb

## 2021-09-08 DIAGNOSIS — Z803 Family history of malignant neoplasm of breast: Secondary | ICD-10-CM | POA: Insufficient documentation

## 2021-09-08 DIAGNOSIS — Z5112 Encounter for antineoplastic immunotherapy: Secondary | ICD-10-CM | POA: Diagnosis not present

## 2021-09-08 DIAGNOSIS — R918 Other nonspecific abnormal finding of lung field: Secondary | ICD-10-CM | POA: Insufficient documentation

## 2021-09-08 DIAGNOSIS — C50211 Malignant neoplasm of upper-inner quadrant of right female breast: Secondary | ICD-10-CM | POA: Insufficient documentation

## 2021-09-08 DIAGNOSIS — Z95828 Presence of other vascular implants and grafts: Secondary | ICD-10-CM

## 2021-09-08 DIAGNOSIS — Z171 Estrogen receptor negative status [ER-]: Secondary | ICD-10-CM | POA: Insufficient documentation

## 2021-09-08 DIAGNOSIS — Z79899 Other long term (current) drug therapy: Secondary | ICD-10-CM | POA: Insufficient documentation

## 2021-09-08 DIAGNOSIS — Z8042 Family history of malignant neoplasm of prostate: Secondary | ICD-10-CM | POA: Insufficient documentation

## 2021-09-08 LAB — CBC WITH DIFFERENTIAL/PLATELET
Abs Immature Granulocytes: 0.01 10*3/uL (ref 0.00–0.07)
Basophils Absolute: 0 10*3/uL (ref 0.0–0.1)
Basophils Relative: 1 %
Eosinophils Absolute: 0.4 10*3/uL (ref 0.0–0.5)
Eosinophils Relative: 6 %
HCT: 32.8 % — ABNORMAL LOW (ref 36.0–46.0)
Hemoglobin: 10.4 g/dL — ABNORMAL LOW (ref 12.0–15.0)
Immature Granulocytes: 0 %
Lymphocytes Relative: 35 %
Lymphs Abs: 2.3 10*3/uL (ref 0.7–4.0)
MCH: 27.4 pg (ref 26.0–34.0)
MCHC: 31.7 g/dL (ref 30.0–36.0)
MCV: 86.3 fL (ref 80.0–100.0)
Monocytes Absolute: 0.4 10*3/uL (ref 0.1–1.0)
Monocytes Relative: 7 %
Neutro Abs: 3.2 10*3/uL (ref 1.7–7.7)
Neutrophils Relative %: 51 %
Platelets: 283 10*3/uL (ref 150–400)
RBC: 3.8 MIL/uL — ABNORMAL LOW (ref 3.87–5.11)
RDW: 14.1 % (ref 11.5–15.5)
WBC: 6.4 10*3/uL (ref 4.0–10.5)
nRBC: 0 % (ref 0.0–0.2)

## 2021-09-08 LAB — TSH: TSH: 3.276 u[IU]/mL (ref 0.308–3.960)

## 2021-09-08 LAB — COMPREHENSIVE METABOLIC PANEL
ALT: 11 U/L (ref 0–44)
AST: 15 U/L (ref 15–41)
Albumin: 4.1 g/dL (ref 3.5–5.0)
Alkaline Phosphatase: 94 U/L (ref 38–126)
Anion gap: 6 (ref 5–15)
BUN: 12 mg/dL (ref 6–20)
CO2: 25 mmol/L (ref 22–32)
Calcium: 9 mg/dL (ref 8.9–10.3)
Chloride: 105 mmol/L (ref 98–111)
Creatinine, Ser: 0.74 mg/dL (ref 0.44–1.00)
GFR, Estimated: >60 mL/min (ref >60)
Glucose, Bld: 97 mg/dL (ref 70–99)
Potassium: 3.8 mmol/L (ref 3.5–5.1)
Sodium: 136 mmol/L (ref 135–145)
Total Bilirubin: 0.3 mg/dL (ref 0.3–1.2)
Total Protein: 7.6 g/dL (ref 6.5–8.1)

## 2021-09-08 MED ORDER — SODIUM CHLORIDE 0.9% FLUSH
10.0000 mL | Freq: Once | INTRAVENOUS | Status: AC
  Administered 2021-09-08: 10 mL

## 2021-09-08 MED ORDER — SODIUM CHLORIDE 0.9 % IV SOLN: Freq: Once | INTRAVENOUS | Status: AC

## 2021-09-08 MED ORDER — HEPARIN SOD (PORK) LOCK FLUSH 100 UNIT/ML IV SOLN
500.0000 [IU] | Freq: Once | INTRAVENOUS | Status: AC | PRN
  Administered 2021-09-08: 500 [IU]

## 2021-09-08 MED ORDER — SODIUM CHLORIDE 0.9 % IV SOLN
200.0000 mg | Freq: Once | INTRAVENOUS | Status: AC
  Administered 2021-09-08: 200 mg via INTRAVENOUS
  Filled 2021-09-08: qty 8

## 2021-09-08 MED ORDER — SODIUM CHLORIDE 0.9% FLUSH
10.0000 mL | INTRAVENOUS | Status: DC | PRN
  Administered 2021-09-08: 10 mL

## 2021-09-08 NOTE — Patient Instructions (Signed)
[facility] MEDICAL ONCOLOG[facility]  Discharge Instructions: Thank you for choosing [facility] to provide your oncology and hematology care.   If you have a lab appointment with the Cancer Center, please go directly to the Cancer Center and check in at the registration area.   Wear comfortable clothing and clothing appropriate for easy access to any Portacath or PICC line.   We strive to give you quality time with your provider. You may need to reschedule your appointment if you arrive late (15 or more minutes).  Arriving late affects you and other patients whose appointments are after yours.  Also, if you miss three or more appointments without notifying the office, you may be dismissed from the clinic at the provider's discretion.      For prescription refill requests, have your pharmacy contact our office and allow 72 hours for refills to be completed.    Today you received the following chemotherapy and/or immunotherapy agents: keytruda      To help prevent nausea and vomiting after your treatment, we encourage you to take your nausea medication as directed.  BELOW ARE SYMPTOMS THAT SHOULD BE REPORTED IMMEDIATELY: *FEVER GREATER THAN 100.4 F (38 C) OR HIGHER *CHILLS OR SWEATING *NAUSEA AND VOMITING THAT IS NOT CONTROLLED WITH YOUR NAUSEA MEDICATION *UNUSUAL SHORTNESS OF BREATH *UNUSUAL BRUISING OR BLEEDING *URINARY PROBLEMS (pain or burning when urinating, or frequent urination) *BOWEL PROBLEMS (unusual diarrhea, constipation, pain near the anus) TENDERNESS IN MOUTH AND THROAT WITH OR WITHOUT PRESENCE OF ULCERS (sore throat, sores in mouth, or a toothache) UNUSUAL RASH, SWELLING OR PAIN  UNUSUAL VAGINAL DISCHARGE OR ITCHING   Items with * indicate a potential emergency and should be followed up as soon as possible or go to the Emergency Department if any problems should occur.  Please show the CHEMOTHERAPY ALERT CARD or IMMUNOTHERAPY ALERT CARD at check-in to  the Emergency Department and triage nurse.  Should you have questions after your visit or need to cancel or reschedule your appointment, please contact [facility]  Dept: [phone]  and follow the prompts.  Office hours are 8:00 a.m. to 4:30 p.m. Monday - Friday. Please note that voicemails left after 4:00 p.m. may not be returned until the following business day.  We are closed weekends and major holidays. You have access to a nurse at all times for urgent questions. Please call the main number to the clinic Dept: [phone] and follow the prompts.   For any non-urgent questions, you may also contact your provider using MyChart. We now offer e-Visits for anyone 18 and older to request care online for non-urgent symptoms. For details visit [email or web address].   Also download the MyChart app! Go to the app store, search "MyChart", open the app, select [facility], and log in with your MyChart username and password.  Due to Covid, a mask is required upon entering the hospital/clinic. If you do not have a mask, one will be given to you upon arrival. For doctor visits, patients may have 1 support person aged 18 or older with them. For treatment visits, patients cannot have anyone with them due to current Covid guidelines and our immunocompromised population.   

## 2021-09-08 NOTE — Progress Notes (Signed)
Port deaccessed per policy.  Redness noted on port.  Pt instructed to go to the Doctor if redness increase, any pain or fever.  Pt verbalizes understanding.

## 2021-09-29 ENCOUNTER — Inpatient Hospital Stay: Payer: 59

## 2021-09-29 ENCOUNTER — Inpatient Hospital Stay (HOSPITAL_BASED_OUTPATIENT_CLINIC_OR_DEPARTMENT_OTHER): Payer: 59 | Admitting: Hematology and Oncology

## 2021-09-29 ENCOUNTER — Other Ambulatory Visit: Payer: Self-pay

## 2021-09-29 ENCOUNTER — Encounter: Payer: Self-pay | Admitting: Hematology and Oncology

## 2021-09-29 ENCOUNTER — Encounter: Payer: Self-pay | Admitting: *Deleted

## 2021-09-29 VITALS — BP 141/77 | HR 95 | Temp 97.3°F | Resp 18 | Wt 164.4 lb

## 2021-09-29 DIAGNOSIS — C50411 Malignant neoplasm of upper-outer quadrant of right female breast: Secondary | ICD-10-CM | POA: Diagnosis not present

## 2021-09-29 DIAGNOSIS — Z171 Estrogen receptor negative status [ER-]: Secondary | ICD-10-CM

## 2021-09-29 DIAGNOSIS — C50211 Malignant neoplasm of upper-inner quadrant of right female breast: Secondary | ICD-10-CM | POA: Diagnosis not present

## 2021-09-29 DIAGNOSIS — Z95828 Presence of other vascular implants and grafts: Secondary | ICD-10-CM

## 2021-09-29 LAB — CBC WITH DIFFERENTIAL (CANCER CENTER ONLY)
Abs Immature Granulocytes: 0.03 10*3/uL (ref 0.00–0.07)
Basophils Absolute: 0 10*3/uL (ref 0.0–0.1)
Basophils Relative: 0 %
Eosinophils Absolute: 0.5 10*3/uL (ref 0.0–0.5)
Eosinophils Relative: 6 %
HCT: 32.9 % — ABNORMAL LOW (ref 36.0–46.0)
Hemoglobin: 10.5 g/dL — ABNORMAL LOW (ref 12.0–15.0)
Immature Granulocytes: 0 %
Lymphocytes Relative: 32 %
Lymphs Abs: 2.3 10*3/uL (ref 0.7–4.0)
MCH: 27.3 pg (ref 26.0–34.0)
MCHC: 31.9 g/dL (ref 30.0–36.0)
MCV: 85.5 fL (ref 80.0–100.0)
Monocytes Absolute: 0.4 10*3/uL (ref 0.1–1.0)
Monocytes Relative: 6 %
Neutro Abs: 3.9 10*3/uL (ref 1.7–7.7)
Neutrophils Relative %: 56 %
Platelet Count: 244 10*3/uL (ref 150–400)
RBC: 3.85 MIL/uL — ABNORMAL LOW (ref 3.87–5.11)
RDW: 14.8 % (ref 11.5–15.5)
WBC Count: 7.1 10*3/uL (ref 4.0–10.5)
nRBC: 0 % (ref 0.0–0.2)

## 2021-09-29 LAB — CMP (CANCER CENTER ONLY)
ALT: 10 U/L (ref 0–44)
AST: 15 U/L (ref 15–41)
Albumin: 4.1 g/dL (ref 3.5–5.0)
Alkaline Phosphatase: 88 U/L (ref 38–126)
Anion gap: 6 (ref 5–15)
BUN: 8 mg/dL (ref 6–20)
CO2: 24 mmol/L (ref 22–32)
Calcium: 9 mg/dL (ref 8.9–10.3)
Chloride: 106 mmol/L (ref 98–111)
Creatinine: 0.72 mg/dL (ref 0.44–1.00)
GFR, Estimated: >60 mL/min (ref >60)
Glucose, Bld: 97 mg/dL (ref 70–99)
Potassium: 3.8 mmol/L (ref 3.5–5.1)
Sodium: 136 mmol/L (ref 135–145)
Total Bilirubin: 0.4 mg/dL (ref 0.3–1.2)
Total Protein: 7.5 g/dL (ref 6.5–8.1)

## 2021-09-29 MED ORDER — SODIUM CHLORIDE 0.9 % IV SOLN: Freq: Once | INTRAVENOUS | Status: AC

## 2021-09-29 MED ORDER — HEPARIN SOD (PORK) LOCK FLUSH 100 UNIT/ML IV SOLN
500.0000 [IU] | Freq: Once | INTRAVENOUS | Status: AC | PRN
  Administered 2021-09-29: 500 [IU]

## 2021-09-29 MED ORDER — SODIUM CHLORIDE 0.9 % IV SOLN
200.0000 mg | Freq: Once | INTRAVENOUS | Status: AC
  Administered 2021-09-29: 200 mg via INTRAVENOUS
  Filled 2021-09-29: qty 200

## 2021-09-29 MED ORDER — SODIUM CHLORIDE 0.9% FLUSH
10.0000 mL | INTRAVENOUS | Status: DC | PRN
  Administered 2021-09-29: 10 mL

## 2021-09-29 MED ORDER — SODIUM CHLORIDE 0.9% FLUSH
10.0000 mL | Freq: Once | INTRAVENOUS | Status: AC
  Administered 2021-09-29: 10 mL

## 2021-09-29 NOTE — Progress Notes (Signed)
[place]  [phone] [phone]    ID: Penny Brown DOB: 03-20-1971  MR#: [id number]  TOI#:[id number]  Patient Care Team: [name], [place] as PCP - General (Family Medicine) [name], MD as Consulting Physician (General Surgery) [name], MD as Consulting Physician (Radiation Oncology) Benay Pike, MD as Consulting Physician (Hematology and Oncology) Benay Pike, MD OTHER MD:  CHIEF COMPLAINT: recurrent vs. new triple negative breast cancer (s/p right mastectomy)  CURRENT TREATMENT: Continuing pembrolizumab   INTERVAL HISTORY:  Penny Brown was scheduled today 03/17/2021 for follow up and treatment of her second triple negative breast cancer.   She continues on pembrolizumab every 21 days.  She is tolerating this well.  She denies any current issues.  She is working. She is still sore in the left breast, cannot do mammogram yet.  She was hoping to try this in summer. She has her last treatment scheduled today but she was wondering if she needs more cycles of immunotherapy since she missed a couple.  Rest of the pertinent 10 point ROS reviewed and negative.  REVIEW OF SYSTEMS: Review of Systems  Constitutional:  Positive for fatigue. Negative for appetite change, chills, fever and unexpected weight change.  HENT:   Negative for hearing loss, lump/mass and trouble swallowing.   Eyes:  Negative for eye problems and icterus.  Respiratory:  Negative for chest tightness, cough and shortness of breath.   Cardiovascular:  Negative for chest pain, leg swelling and palpitations.  Gastrointestinal:  Negative for abdominal distention, abdominal pain, constipation, diarrhea, nausea and vomiting.  Endocrine: Negative for hot flashes.  Genitourinary:  Negative for difficulty urinating.   Musculoskeletal:  Negative for arthralgias.  Skin:  Positive for wound. Negative for itching and rash.  Neurological:  Negative for dizziness,  extremity weakness, headaches and numbness.  Hematological:  Negative for adenopathy. Does not bruise/bleed easily.  Psychiatric/Behavioral:  Negative for depression. The patient is not nervous/anxious.       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 She received 4 cycles of doxorubicin and cyclophosphamide followed by 4 cycles of of paclitaxel, which was discontinued secondary to neuropathy. She was released from follow up here in 2012.  More recently, she presented with a palpable upper-inner right breast mass. She underwent bilateral diagnostic mammography with tomography and right breast ultrasonography at [facility] on 09/22/2020 showing: breast density category A; 2.2 cm irregular mass in upper-inner right breast; no significant abnormalities in right axilla.  Accordingly on 09/22/2020 she proceeded to biopsy of the right breast area in question. The pathology from this procedure ([id number]) showed: invasive ductal carcinoma, grade 3. Prognostic indicators significant for: estrogen receptor, 0% negative and progesterone receptor, 0% negative. Proliferation marker Ki67 at 80%. HER2 negatve by immunohistochemistry (1+).   Cancer Staging  Malignant neoplasm of upper-inner quadrant of right breast in female, estrogen receptor negative ([place]) Staging form: Breast, AJCC 8th Edition - Clinical stage from 09/29/2020: Stage IIB (rcT2, cN0, cM0, G3, ER-, PR-, HER2-) - Signed by [name], MD on 09/29/2020 Stage prefix: Recurrence  Malignant neoplasm of upper-outer quadrant of right breast in female, estrogen receptor negative ([place]) Staging form: Breast, AJCC 8th Edition - Clinical: cT2, cN1, cM0, ER-, PR-, HER2- - Signed by [name], MD  on 09/29/2020 Stage prefix: Initial diagnosis Nuclear grade: G3  The patient's subsequent history is as detailed below.   PAST MEDICAL HISTORY: Past Medical History:  Diagnosis Date   Breast cancer ([place]) 2008   Right Breast Cancer   Breast cancer ([place]) 09/2020   Right Breast   Cancer ([place])    Family history of breast cancer    Family history of prostate cancer    Personal history of chemotherapy 2008   Right Breast Cancer   Personal history of radiation therapy 2008   Right Breast Cancer    PAST SURGICAL HISTORY: Past Surgical History:  Procedure Laterality Date   BREAST LUMPECTOMY Right 2008   BREAST REDUCTION SURGERY Left 06/03/2021   Procedure: MAMMARY REDUCTION  (BREAST);  Surgeon: Irene Limbo, MD;  Location: [facility];  Service: Plastics;  Laterality: Left;   CESAREAN SECTION     IR CV LINE INJECTION  08/25/2021   MASTECTOMY W/ SENTINEL NODE BIOPSY Right 02/24/2021   Procedure: RIGHT MASTECTOMY WITH RIGHT AXILLARY SENTINEL LYMPH NODE BIOPSY;  Surgeon: [name], MD;  Location: [facility];  Service: General;  Laterality: Right;   PORTACATH PLACEMENT Left 10/13/2020   Procedure: INSERTION PORT-A-CATH;  Surgeon: [name], MD;  Location: [facility];  Service: General;  Laterality: Left;  LEAVE PORT ACCESSED CHEMO STARTS ON 2/10, LEFT SIDE    FAMILY HISTORY: Family History  Problem Relation Age of Onset   Breast cancer Maternal Grandmother 78   Prostate cancer Maternal Grandfather        dx 15s, metastatic   Her father died from heart attack (possibly Covid?) at age 2. Her mother is age 38 as of 09/2020. Penny Brown has 2 brothers and 2 sisters. She reports breast cancer in her maternal grandmother in her late 13's.   GYNECOLOGIC HISTORY:  No LMP recorded. Menarche: 51 years old Age at first live birth: 51 years old [place] P 3 LMP 09/09/2020, periods are regular, 7 days with 4 heavy Contraceptive: s/p BTL with coils  in place HRT n/a  Hysterectomy? no BSO? 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 Husband Penny Brown is a Cabin crew. She lives at home with Penny Brown, daughter [name], age 68, and daughter [name], age 69 who works as a Curator. Son [name], age 39, is a Psychiatric nurse in [employer]. Alizay has one grandchild. She attends [employer].    ADVANCED DIRECTIVES: In the absence of any documentation to the contrary, the patient's spouse is their HCPOA.    HEALTH MAINTENANCE: Social History   Tobacco Use   Smoking status: Never   Smokeless tobacco: Never  Vaping Use   Vaping Use: Never used  Substance Use Topics   Alcohol use: No   Drug use: No     Colonoscopy: n/a (age)   PAP: 2019  Bone density: n/a (age)   No Known Allergies  Current Outpatient Medications  Medication Sig Dispense Refill   albuterol (VENTOLIN HFA) 108 (90 Base) MCG/ACT inhaler Inhale 2 puffs into the lungs every 6 (six) hours as needed for wheezing or shortness of breath. 8 g 2   lidocaine-prilocaine (EMLA) cream Apply 1 application topically as needed (port access). 30 g 1   LORazepam (ATIVAN) 0.5 MG tablet Take one tablet before coming to cancer center for chemo treatment (Patient taking differently: Take 0.5 mg by mouth See admin instructions. Take one tablet before  coming to cancer center for chemo treatment) 30 tablet 0   methocarbamol (ROBAXIN) 750 MG tablet Take 1 tablet (750 mg total) by mouth every 8 (eight) hours as needed (use for muscle cramps/pain). 30 tablet 2   traMADol (ULTRAM) 50 MG tablet Take 1 tablet (50 mg total) by mouth every 6 (six) hours as needed. (Patient taking differently: Take 50 mg by mouth every 6 (six) hours as needed for severe pain.) 8 tablet 0   No current facility-administered medications for this visit.    OBJECTIVE:   Vitals:   09/29/21 1355  BP: (!) 141/77  Pulse: 95  Resp:  18  Temp: (!) 97.3 F (36.3 C)  SpO2: 100%      Body mass index is 30.07 kg/m.   Wt Readings from Last 3 Encounters:  09/29/21 164 lb 6.4 oz (74.6 kg)  09/08/21 163 lb 8 oz (74.2 kg)  08/18/21 164 lb 8 oz (74.6 kg)     ECOG FS:1 - Symptomatic but completely ambulatory  GENERAL: Patient is a well appearing female in no acute distress HEENT:  Sclerae anicteric.  Oropharynx clear and moist. No ulcerations or evidence of oropharyngeal candidiasis. Neck is supple.  NODES:  No cervical, supraclavicular, or axillary lymphadenopathy palpated.  BREAST EXAM: Right mastectomy is without sign of recurrence left breast has scar consistent with previous wound dehiscence.  No evidence of discharge.   LUNG : Mild expiratory wheeze noted, otherwise clear to auscultation bilaterally.  No rhonchi. HEART:  Regular rate and rhythm. No murmur appreciated. ABDOMEN:  Soft, nontender.  Positive, normoactive bowel sounds. No organomegaly palpated. MSK:  No focal spinal tenderness to palpation. Full range of motion bilaterally in the upper extremities. EXTREMITIES:  No peripheral edema.   SKIN:  Clear with no obvious rashes or skin changes. No nail dyscrasia. NEURO:  Nonfocal. Well oriented.  Appropriate affect.  LAB RESULTS:  CMP     Component Value Date/Time   NA 136 09/29/2021 1343   K 3.8 09/29/2021 1343   CL 106 09/29/2021 1343   CO2 24 09/29/2021 1343   GLUCOSE 97 09/29/2021 1343   BUN 8 09/29/2021 1343   CREATININE 0.72 09/29/2021 1343   CALCIUM 9.0 09/29/2021 1343   PROT 7.5 09/29/2021 1343   ALBUMIN 4.1 09/29/2021 1343   AST 15 09/29/2021 1343   ALT 10 09/29/2021 1343   ALKPHOS 88 09/29/2021 1343   BILITOT 0.4 09/29/2021 1343   GFRNONAA >60 09/29/2021 1343   GFRAA  04/11/2007 1225    >60        The eGFR has been calculated using the MDRD equation. This calculation has not been validated in all clinical    No results found for: TOTALPROTELP, ALBUMINELP, A1GS, A2GS, BETS, BETA2SER,  GAMS, MSPIKE, SPEI  Lab Results  Component Value Date   WBC 7.1 09/29/2021   NEUTROABS 3.9 09/29/2021   HGB 10.5 (L) 09/29/2021   HCT 32.9 (L) 09/29/2021   MCV 85.5 09/29/2021   PLT 244 09/29/2021    Lab Results  Component Value Date   LABCA2 17 11/25/2007    No components found for: [id number]  No results for input(s): INR in the last 168 hours.  Lab Results  Component Value Date   LABCA2 17 11/25/2007    No results found for: UTM546  No results found for: TKP546  No results found for: FKC127  No results found for: CA2729  No components found for: HGQUANT  No results found for: CEA1 / No results  found for: CEA1   No results found for: AFPTUMOR  No results found for: CHROMOGRNA  No results found for: KPAFRELGTCHN, LAMBDASER, KAPLAMBRATIO (kappa/lambda light chains)  No results found for: HGBA, HGBA2QUANT, HGBFQUANT, HGBSQUAN (Hemoglobinopathy evaluation)   Lab Results  Component Value Date   LDH 156 11/25/2007    No results found for: IRON, TIBC, IRONPCTSAT (Iron and TIBC)  No results found for: FERRITIN  Urinalysis    Component Value Date/Time   LABSPEC 1.015 03/13/2017 1905   PHURINE 7.0 03/13/2017 1905   GLUCOSEU NEGATIVE 03/13/2017 1905   HGBUR MODERATE (A) 03/13/2017 1905   BILIRUBINUR NEGATIVE 03/13/2017 1905   KETONESUR NEGATIVE 03/13/2017 1905   PROTEINUR NEGATIVE 03/13/2017 1905   UROBILINOGEN 0.2 03/13/2017 1905   NITRITE NEGATIVE 03/13/2017 1905   LEUKOCYTESUR MODERATE (A) 03/13/2017 1905    STUDIES: No results found.    ELIGIBLE FOR AVAILABLE RESEARCH PROTOCOL: no  ASSESSMENT: 51 y.o. [place] woman  (1) status post right breast upper outer quadrant lumpectomy and sentinel lymph node sampling 04/15/2007 for a pT2 pN1, stage IIIB invasive ductal carcinoma, grade 3, triple negative, with an MIB-1 of 59%  (a) adjuvant chemotherapy consisted of doxorubicin and cyclophosphamide in dose dense fashion x4 followed by weekly  paclitaxel x12  (b) received adjuvant radiation  (c) a total of 4 right axillary lymph nodes were removed  (2) status post right breast upper inner quadrant biopsy 09/22/2020 for a clinical T2 N2, stage IIIC invasive ductal carcinoma, grade 3, triple negative, with an MIB-1 of 80%.  (a) chest CT 10/28/2020 shows indeterminate 0.3 cm or smaller pulmonary nodules  (b) bone scan 10/29/2020 shows no evidence of metatstatic disease  (3) genetics testing 10/19/2020 through the Common hereditary Cancers panel offered by [facility] shows no deleterious mutations  (4) neoadjuvant chemotherapy started 10/14/2020 consisting of pembrolizumab given every 3 weeks, with carboplatin + paclitaxel given weekly, 3 weeks on, one week off, wityh 4 cycles planned (12 carbo/Taxol doses), last dose 12/17/2020  (a) final 4 cycles of chemotherapy omitted because of neuropathy  (b) breast MRI 12/24/2020 shows near complete resolution of malignancy  (5) status post right mastectomy and sentinel lymph node sampling 02/24/2021 showing a residual ypT1a ypN0 invasive ductal carcinoma, grade 3, with ample margins  (a) a single right axillary lymph node was removed  (6) to continue pembrolizumab to total one year (9 post-op doses)   PLAN:   Ieasha continues on pembrolizumab every 3 weeks with good tolerance.  She is clinically without any signs of breast cancer recurrence.  She will continue on pembrolizumab for 9 cycles adjuvant.  She received 7 cycles in the adjuvant setting, will add 2 more cycles and schedule them. She will return to clinic in 3 weeks. We will try to attempt mammogram in summer.  Total encounter time: 30 minutes in face to face visit time, chart review, lab review, order entry, and documentation of the encounter.   *Total Encounter Time as defined by the Centers for Medicare and Medicaid Services includes, in addition to the face-to-face time of a patient visit (documented in the note above)  non-face-to-face time: obtaining and reviewing outside history, ordering and reviewing medications, tests or procedures, care coordination (communications with other health care professionals or caregivers) and documentation in the medical record.

## 2021-09-29 NOTE — Patient Instructions (Signed)
[name]ICAL [facility]COL[facility]  Discharge Instructions: Thank you for choosing [facility] to provide your oncology and hematology care.   If you have a lab appointment with the Cancer Center, please go directly to the Cancer Center and check in at the registration area.   Wear comfortable clothing and clothing appropriate for easy access to any Portacath or PICC line.   We strive to give you quality time with your provider. You may need to reschedule your appointment if you arrive late (15 or more minutes).  Arriving late affects you and other patients whose appointments are after yours.  Also, if you miss three or more appointments without notifying the office, you may be dismissed from the clinic at the provider's discretion.      For prescription refill requests, have your pharmacy contact our office and allow 72 hours for refills to be completed.    Today you received the following chemotherapy and/or immunotherapy agents: keytruda      To help prevent nausea and vomiting after your treatment, we encourage you to take your nausea medication as directed.  BELOW ARE SYMPTOMS THAT SHOULD BE REPORTED IMMEDIATELY: *FEVER GREATER THAN 100.4 F (38 C) OR HIGHER *CHILLS OR SWEATING *NAUSEA AND VOMITING THAT IS NOT CONTROLLED WITH YOUR NAUSEA MEDICATION *UNUSUAL SHORTNESS OF BREATH *UNUSUAL BRUISING OR BLEEDING *URINARY PROBLEMS (pain or burning when urinating, or frequent urination) *BOWEL PROBLEMS (unusual diarrhea, constipation, pain near the anus) TENDERNESS IN MOUTH AND THROAT WITH OR WITHOUT PRESENCE OF ULCERS (sore throat, sores in mouth, or a toothache) UNUSUAL RASH, SWELLING OR PAIN  UNUSUAL VAGINAL DISCHARGE OR ITCHING   Items with * indicate a potential emergency and should be followed up as soon as possible or go to the Emergency Department if any problems should occur.  Please show the CHEMOTHERAPY ALERT CARD or IMMUNOTHERAPY ALERT CARD at check-in to  the Emergency Department and triage nurse.  Should you have questions after your visit or need to cancel or reschedule your appointment, please contact [facility]  Dept: [phone]  and follow the prompts.  Office hours are 8:00 a.m. to 4:30 p.m. Monday - Friday. Please note that voicemails left after 4:00 p.m. may not be returned until the following business day.  We are closed weekends and major holidays. You have access to a nurse at all times for urgent questions. Please call the main number to the clinic Dept: [phone] and follow the prompts.   For any non-urgent questions, you may also contact your provider using MyChart. We now offer e-Visits for anyone 18 and older to request care online for non-urgent symptoms. For details visit [email or web address].   Also download the MyChart app! Go to the app store, search "MyChart", open the app, select , and log in with your MyChart username and password.  Due to Covid, a mask is required upon entering the hospital/clinic. If you do not have a mask, one will be given to you upon arrival. For doctor visits, patients may have 1 support person aged 18 or older with them. For treatment visits, patients cannot have anyone with them due to current Covid guidelines and our immunocompromised population.   

## 2021-09-30 ENCOUNTER — Encounter: Payer: Self-pay | Admitting: Hematology and Oncology

## 2021-09-30 NOTE — Progress Notes (Signed)
I completed the [employer] for [name] for 2023, got the pt's and Dr. Rob Hickman signature and faxed for processing.  I will notify the pt of the outcome once received.

## 2021-10-05 ENCOUNTER — Other Ambulatory Visit: Payer: Self-pay | Admitting: Oncology

## 2021-10-05 ENCOUNTER — Telehealth: Payer: Self-pay

## 2021-10-05 NOTE — Telephone Encounter (Signed)
Patient notified of completion of FMLA forms. Fax transmission confirmation received. Copy of forms e-mailed to Patient as requested. No other needs voiced at this time.

## 2021-10-11 ENCOUNTER — Encounter: Payer: Self-pay | Admitting: Hematology and Oncology

## 2021-10-11 NOTE — Progress Notes (Signed)
Pt has been enrolled w/ the [employer] program for [facility] for $25,000 from 09/04/21 - 09/03/22.  Her copay for Beryle Flock will be $25.

## 2021-10-17 IMAGING — NM NM BONE WHOLE BODY
2 series · 2 of 2 positions shown · non-contrast
Comparison: Prior bone scan examination May 20, 2007. Chest
CT October 27, 2020

CLINICAL DATA: Breast carcinoma

EXAM:
NUCLEAR MEDICINE WHOLE BODY BONE SCAN
TECHNIQUE: Whole body anterior and posterior images were obtained approximately
3 hours after intravenous injection of radiopharmaceutical.
RADIOPHARMACEUTICALS:  19.0 mCi Vechnetium-IIm MDP IV

[Series 1: whole body · 2.66mm/px · 1 of 1 slices shown (1 of 2)]
[im 1/1]
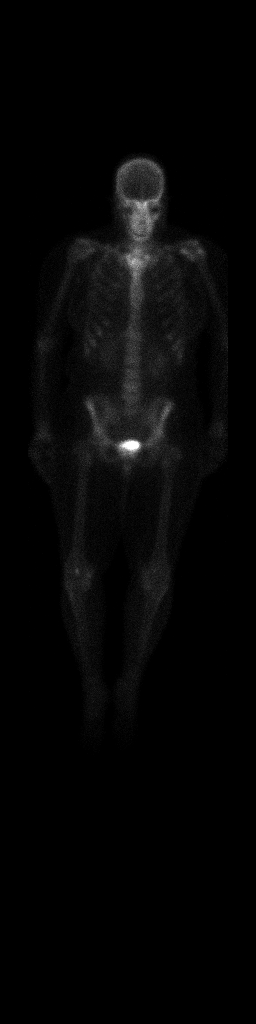

[Series 1: whole body · 2.66mm/px · 1 of 1 slices shown (2 of 2)]
[im 1/1]
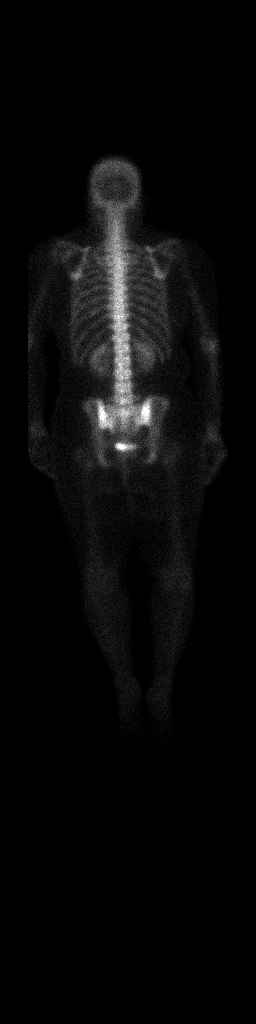

[2 of 2 positions shown; findings below may reference images not displayed]

FINDINGS: Radiotracer uptake in the bony structures is unremarkable. There are
no findings indicative of bony metastatic disease. Kidneys are noted
in the flank positions bilaterally.
IMPRESSION: No bony metastatic disease evident.

## 2021-10-19 ENCOUNTER — Other Ambulatory Visit: Payer: Self-pay | Admitting: Hematology and Oncology

## 2021-10-19 DIAGNOSIS — Z171 Estrogen receptor negative status [ER-]: Secondary | ICD-10-CM

## 2021-10-19 DIAGNOSIS — C50211 Malignant neoplasm of upper-inner quadrant of right female breast: Secondary | ICD-10-CM

## 2021-10-19 NOTE — Progress Notes (Signed)
9 adjuvant doses of keytruda according to the plan will be completed on 10/20/2021  Penny Brown

## 2021-10-20 ENCOUNTER — Inpatient Hospital Stay: Payer: 59

## 2021-10-20 ENCOUNTER — Inpatient Hospital Stay (HOSPITAL_BASED_OUTPATIENT_CLINIC_OR_DEPARTMENT_OTHER): Payer: 59 | Admitting: Hematology and Oncology

## 2021-10-20 ENCOUNTER — Other Ambulatory Visit: Payer: Self-pay

## 2021-10-20 ENCOUNTER — Inpatient Hospital Stay: Payer: 59 | Attending: Oncology

## 2021-10-20 ENCOUNTER — Encounter: Payer: Self-pay | Admitting: Hematology and Oncology

## 2021-10-20 VITALS — BP 133/80 | HR 97 | Temp 97.7°F | Resp 18 | Ht 62.0 in | Wt 166.3 lb

## 2021-10-20 DIAGNOSIS — Z171 Estrogen receptor negative status [ER-]: Secondary | ICD-10-CM

## 2021-10-20 DIAGNOSIS — Z8042 Family history of malignant neoplasm of prostate: Secondary | ICD-10-CM | POA: Diagnosis not present

## 2021-10-20 DIAGNOSIS — Z803 Family history of malignant neoplasm of breast: Secondary | ICD-10-CM | POA: Insufficient documentation

## 2021-10-20 DIAGNOSIS — Z79899 Other long term (current) drug therapy: Secondary | ICD-10-CM | POA: Insufficient documentation

## 2021-10-20 DIAGNOSIS — Z923 Personal history of irradiation: Secondary | ICD-10-CM | POA: Insufficient documentation

## 2021-10-20 DIAGNOSIS — Z9221 Personal history of antineoplastic chemotherapy: Secondary | ICD-10-CM | POA: Diagnosis not present

## 2021-10-20 DIAGNOSIS — C50211 Malignant neoplasm of upper-inner quadrant of right female breast: Secondary | ICD-10-CM

## 2021-10-20 DIAGNOSIS — Z95828 Presence of other vascular implants and grafts: Secondary | ICD-10-CM

## 2021-10-20 LAB — CBC WITH DIFFERENTIAL/PLATELET
Abs Immature Granulocytes: 0.04 10*3/uL (ref 0.00–0.07)
Basophils Absolute: 0 10*3/uL (ref 0.0–0.1)
Basophils Relative: 0 %
Eosinophils Absolute: 0.5 10*3/uL (ref 0.0–0.5)
Eosinophils Relative: 8 %
HCT: 32.8 % — ABNORMAL LOW (ref 36.0–46.0)
Hemoglobin: 10.3 g/dL — ABNORMAL LOW (ref 12.0–15.0)
Immature Granulocytes: 1 %
Lymphocytes Relative: 35 %
Lymphs Abs: 2.3 10*3/uL (ref 0.7–4.0)
MCH: 27.5 pg (ref 26.0–34.0)
MCHC: 31.4 g/dL (ref 30.0–36.0)
MCV: 87.5 fL (ref 80.0–100.0)
Monocytes Absolute: 0.4 10*3/uL (ref 0.1–1.0)
Monocytes Relative: 6 %
Neutro Abs: 3.4 10*3/uL (ref 1.7–7.7)
Neutrophils Relative %: 50 %
Platelets: 237 10*3/uL (ref 150–400)
RBC: 3.75 MIL/uL — ABNORMAL LOW (ref 3.87–5.11)
RDW: 15.2 % (ref 11.5–15.5)
WBC: 6.7 10*3/uL (ref 4.0–10.5)
nRBC: 0 % (ref 0.0–0.2)

## 2021-10-20 LAB — TSH: TSH: 2.405 u[IU]/mL (ref 0.308–3.960)

## 2021-10-20 LAB — COMPREHENSIVE METABOLIC PANEL
ALT: 11 U/L (ref 0–44)
AST: 15 U/L (ref 15–41)
Albumin: 4 g/dL (ref 3.5–5.0)
Alkaline Phosphatase: 80 U/L (ref 38–126)
Anion gap: 4 — ABNORMAL LOW (ref 5–15)
BUN: 10 mg/dL (ref 6–20)
CO2: 27 mmol/L (ref 22–32)
Calcium: 8.8 mg/dL — ABNORMAL LOW (ref 8.9–10.3)
Chloride: 107 mmol/L (ref 98–111)
Creatinine, Ser: 0.69 mg/dL (ref 0.44–1.00)
GFR, Estimated: >60 mL/min (ref >60)
Glucose, Bld: 96 mg/dL (ref 70–99)
Potassium: 3.9 mmol/L (ref 3.5–5.1)
Sodium: 138 mmol/L (ref 135–145)
Total Bilirubin: 0.2 mg/dL — ABNORMAL LOW (ref 0.3–1.2)
Total Protein: 7.1 g/dL (ref 6.5–8.1)

## 2021-10-20 MED ORDER — SODIUM CHLORIDE 0.9% FLUSH
10.0000 mL | INTRAVENOUS | Status: DC | PRN
  Administered 2021-10-20: 10 mL via INTRAVENOUS

## 2021-10-20 MED ORDER — SODIUM CHLORIDE 0.9 % IV SOLN: Freq: Once | INTRAVENOUS | Status: AC

## 2021-10-20 MED ORDER — SODIUM CHLORIDE 0.9% FLUSH
10.0000 mL | INTRAVENOUS | Status: DC | PRN
  Administered 2021-10-20: 10 mL

## 2021-10-20 MED ORDER — SODIUM CHLORIDE 0.9 % IV SOLN
200.0000 mg | Freq: Once | INTRAVENOUS | Status: AC
  Administered 2021-10-20: 200 mg via INTRAVENOUS
  Filled 2021-10-20: qty 8

## 2021-10-20 MED ORDER — HEPARIN SOD (PORK) LOCK FLUSH 100 UNIT/ML IV SOLN
500.0000 [IU] | Freq: Once | INTRAVENOUS | Status: AC | PRN
  Administered 2021-10-20: 500 [IU]

## 2021-10-20 NOTE — Progress Notes (Signed)
[place]  [phone] [phone]    ID: Penny Brown DOB: [birth year]  MR#: [id number]  CSN#:[id number]  Patient Care Team: [name], [place] as PCP - General (Family Medicine) [name], MD as Consulting Physician (General Surgery) [name], MD as Consulting Physician (Radiation Oncology) Benay Pike, MD as Consulting Physician (Hematology and Oncology) Benay Pike, MD OTHER MD:  CHIEF COMPLAINT: recurrent vs. new triple negative breast cancer (s/p right mastectomy)  CURRENT TREATMENT: Continuing pembrolizumab   INTERVAL HISTORY:  Penny Brown was scheduled today 03/17/2021 for follow up and treatment of her second triple negative breast cancer.   She continues on pembrolizumab every 21 days.    She says she is still sore in the left breast, would like to mammogram later, may be Summer. She has been tolerating immunotherapy remarkably well.  She will have 1 more treatment due after today since she had no treatment in January because of lack of IV access. Rest of the pertinent 10 point ROS reviewed and negative.  REVIEW OF SYSTEMS: Review of Systems  Constitutional:  Positive for fatigue. Negative for appetite change, chills, fever and unexpected weight change.  HENT:   Negative for hearing loss, lump/mass and trouble swallowing.   Eyes:  Negative for eye problems and icterus.  Respiratory:  Negative for chest tightness, cough and shortness of breath.   Cardiovascular:  Negative for chest pain, leg swelling and palpitations.  Gastrointestinal:  Negative for abdominal distention, abdominal pain, constipation, diarrhea, nausea and vomiting.  Endocrine: Negative for hot flashes.  Genitourinary:  Negative for difficulty urinating.   Musculoskeletal:  Negative for arthralgias.  Skin:  Negative for itching, rash and wound.  Neurological:  Negative for dizziness, extremity weakness, headaches and numbness.  Hematological:   Negative for adenopathy. Does not bruise/bleed easily.  Psychiatric/Behavioral:  Negative for depression. The patient is not nervous/anxious.       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 She received 4 cycles of doxorubicin and cyclophosphamide followed by 4 cycles of of paclitaxel, which was discontinued secondary to neuropathy. She was released from follow up here in 2012.  She underwent bilateral diagnostic mammography with tomography and right breast ultrasonography at [facility] on 09/22/2020 showing: breast density category A; 2.2 cm irregular mass in upper-inner right breast; no significant abnormalities in right axilla.  Accordingly on 09/22/2020 she proceeded to biopsy of the right breast area in question. The pathology from this procedure ([id number]) showed: invasive ductal carcinoma, grade 3. Prognostic indicators significant for: estrogen receptor, 0% negative and progesterone receptor, 0% negative. Proliferation marker Ki67 at 80%. HER2 negatve by immunohistochemistry (1+).   Cancer Staging  Malignant neoplasm of upper-inner quadrant of right breast in female, estrogen receptor negative ([place]) Staging form: Breast, AJCC 8th Edition - Clinical stage from 09/29/2020: Stage IIB (rcT2, cN0, cM0, G3, ER-, PR-, HER2-) - Signed by [name], MD on 09/29/2020 Stage prefix: Recurrence  Malignant neoplasm of upper-outer quadrant of right breast in female, estrogen receptor negative ([place]) Staging form: Breast, AJCC 8th Edition - Clinical: cT2, cN1, cM0, ER-, PR-, HER2- - Signed by [name], MD on 09/29/2020 Stage prefix: Initial diagnosis Nuclear grade: G3  The patient's subsequent history is as detailed  below.   PAST MEDICAL HISTORY:  Past Medical History:  Diagnosis Date   Breast cancer [facility]) 2008   Right Breast Cancer   Breast cancer ([place]) 09/2020   Right Breast   Cancer ([place])    Family history of breast cancer    Family history of prostate cancer    Personal history of chemotherapy 2008   Right Breast Cancer   Personal history of radiation therapy 2008   Right Breast Cancer    PAST SURGICAL HISTORY: Past Surgical History:  Procedure Laterality Date   BREAST LUMPECTOMY Right 2008   BREAST REDUCTION SURGERY Left 06/03/2021   Procedure: MAMMARY REDUCTION  (BREAST);  Surgeon: Irene Limbo, MD;  Location: [facility];  Service: Plastics;  Laterality: Left;   CESAREAN SECTION     IR CV LINE INJECTION  08/25/2021   MASTECTOMY W/ SENTINEL NODE BIOPSY Right 02/24/2021   Procedure: RIGHT MASTECTOMY WITH RIGHT AXILLARY SENTINEL LYMPH NODE BIOPSY;  Surgeon: [name], MD;  Location: [facility];  Service: General;  Laterality: Right;   PORTACATH PLACEMENT Left 10/13/2020   Procedure: INSERTION PORT-A-CATH;  Surgeon: [name], MD;  Location: [facility];  Service: General;  Laterality: Left;  LEAVE PORT ACCESSED CHEMO STARTS ON 2/10, LEFT SIDE    FAMILY HISTORY: Family History  Problem Relation Age of Onset   Breast cancer Maternal Grandmother 78   Prostate cancer Maternal Grandfather        dx 79s, metastatic   Her father died from heart attack (possibly Covid?) at age 14. Her mother is age 60 as of 09/2020. Penny Brown has 2 brothers and 2 sisters. She reports breast cancer in her maternal grandmother in her late 50's.   GYNECOLOGIC HISTORY:  No LMP recorded. Menarche: 51 years old Age at first live birth: 51 years old Penny Brown P 3 LMP 09/09/2020, periods are regular, 7 days with 4 heavy Contraceptive: s/p BTL with coils in place HRT n/a  Hysterectomy? no BSO? 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 She works from home. Husband [name] is a Cabin crew. She lives at home with [name], daughter [name], age 3, and daughter [name], age 19 who works as a Curator. Son [name], age 38, is a Psychiatric nurse in [employer]. Vona has one grandchild. She attends [employer].    ADVANCED DIRECTIVES: In the absence of any documentation to the contrary, the patient's spouse is their HCPOA.    HEALTH MAINTENANCE: Social History   Tobacco Use   Smoking status: Never   Smokeless tobacco: Never  Vaping Use   Vaping Use: Never used  Substance Use Topics   Alcohol use: No   Drug use: No     Colonoscopy: n/a (age)   PAP: 2019  Bone density: n/a (age)   No Known Allergies  Current Outpatient Medications  Medication Sig Dispense Refill   albuterol (VENTOLIN HFA) 108 (90 Base) MCG/ACT inhaler Inhale 2 puffs into the lungs every 6 (six) hours as needed for wheezing or shortness of breath. 8 g 2   lidocaine-prilocaine (EMLA) cream Apply 1 application topically as needed (port access). 30 g 1   LORazepam (ATIVAN) 0.5 MG tablet Take one tablet before coming to cancer center for chemo treatment (Patient taking differently: Take 0.5 mg by mouth See admin instructions. Take one tablet before coming to cancer center for chemo treatment) 30 tablet 0   methocarbamol (ROBAXIN) 750 MG tablet Take 1 tablet (750 mg total)  by mouth every 8 (eight) hours as needed (use for muscle cramps/pain). 30 tablet 2   traMADol (ULTRAM) 50 MG tablet Take 1 tablet (50 mg total) by mouth every 6 (six) hours as needed. (Patient taking differently: Take 50 mg by mouth every 6 (six) hours as needed for severe pain.) 8 tablet 0   No current facility-administered medications for this visit.   Facility-Administered Medications Ordered in Other Visits  Medication Dose Route Frequency Provider Last Rate Last Admin   heparin lock flush 100 unit/mL  500 Units Intracatheter Once PRN Talaysia Pinheiro, Arletha Pili,  MD       pembrolizumab (KEYTRUDA) 200 mg in sodium chloride 0.9 % 50 mL chemo infusion  200 mg Intravenous Once Suan Pyeatt, Arletha Pili, MD 116 mL/hr at 10/20/21 1509 200 mg at 10/20/21 1509   sodium chloride flush (NS) 0.9 % injection 10 mL  10 mL Intracatheter PRN Taneil Lazarus, Arletha Pili, MD        OBJECTIVE:   Vitals:   10/20/21 1357  BP: 133/80  Pulse: 97  Resp: 18  Temp: 97.7 F (36.5 C)  SpO2: 99%      Body mass index is 30.42 kg/m.   Wt Readings from Last 3 Encounters:  10/20/21 166 lb 4.8 oz (75.4 kg)  09/29/21 164 lb 6.4 oz (74.6 kg)  09/08/21 163 lb 8 oz (74.2 kg)     ECOG FS:1 - Symptomatic but completely ambulatory  GENERAL: Patient is a well appearing female in no acute distress HEENT:  Sclerae anicteric.  Oropharynx clear and moist. No ulcerations or evidence of oropharyngeal candidiasis. Neck is supple.  NODES:  No cervical, supraclavicular, or axillary lymphadenopathy palpated.    LUNG : Mild expiratory wheeze noted, otherwise clear to auscultation bilaterally.  No rhonchi. HEART:  Regular rate and rhythm. No murmur appreciated. ABDOMEN:  Soft, nontender.  Positive, normoactive bowel sounds. No organomegaly palpated. MSK:  No focal spinal tenderness to palpation. Full range of motion bilaterally in the upper extremities. EXTREMITIES:  No peripheral edema.   SKIN:  Clear with no obvious rashes or skin changes. No nail dyscrasia. NEURO:  Nonfocal. Well oriented.  Appropriate affect.  LAB RESULTS:  CMP     Component Value Date/Time   NA 138 10/20/2021 1340   K 3.9 10/20/2021 1340   CL 107 10/20/2021 1340   CO2 27 10/20/2021 1340   GLUCOSE 96 10/20/2021 1340   BUN 10 10/20/2021 1340   CREATININE 0.69 10/20/2021 1340   CREATININE 0.72 09/29/2021 1343   CALCIUM 8.8 (L) 10/20/2021 1340   PROT 7.1 10/20/2021 1340   ALBUMIN 4.0 10/20/2021 1340   AST 15 10/20/2021 1340   AST 15 09/29/2021 1343   ALT 11 10/20/2021 1340   ALT 10 09/29/2021 1343   ALKPHOS 80 10/20/2021 1340    BILITOT 0.2 (L) 10/20/2021 1340   BILITOT 0.4 09/29/2021 1343   GFRNONAA >60 10/20/2021 1340   GFRNONAA >60 09/29/2021 1343   GFRAA  04/11/2007 1225    >60        The eGFR has been calculated using the MDRD equation. This calculation has not been validated in all clinical    No results found for: TOTALPROTELP, ALBUMINELP, A1GS, A2GS, BETS, BETA2SER, GAMS, MSPIKE, SPEI  Lab Results  Component Value Date   WBC 6.7 10/20/2021   NEUTROABS 3.4 10/20/2021   HGB 10.3 (L) 10/20/2021   HCT 32.8 (L) 10/20/2021   MCV 87.5 10/20/2021   PLT 237 10/20/2021    Lab Results  Component Value  Date   LABCA2 17 11/25/2007    No components found for: CHYIFO277  No results for input(s): INR in the last 168 hours.  Lab Results  Component Value Date   LABCA2 17 11/25/2007    No results found for: AJO878  No results found for: MVE720  No results found for: NOB096  No results found for: CA2729  No components found for: HGQUANT  No results found for: CEA1 / No results found for: CEA1   No results found for: AFPTUMOR  No results found for: CHROMOGRNA  No results found for: KPAFRELGTCHN, LAMBDASER, KAPLAMBRATIO (kappa/lambda light chains)  No results found for: HGBA, HGBA2QUANT, HGBFQUANT, HGBSQUAN (Hemoglobinopathy evaluation)   Lab Results  Component Value Date   LDH 156 11/25/2007    No results found for: IRON, TIBC, IRONPCTSAT (Iron and TIBC)  No results found for: FERRITIN  Urinalysis    Component Value Date/Time   LABSPEC 1.015 03/13/2017 1905   PHURINE 7.0 03/13/2017 1905   GLUCOSEU NEGATIVE 03/13/2017 1905   HGBUR MODERATE (A) 03/13/2017 1905   BILIRUBINUR NEGATIVE 03/13/2017 [place] 03/13/2017 1905   PROTEINUR NEGATIVE 03/13/2017 1905   UROBILINOGEN 0.2 03/13/2017 1905   NITRITE NEGATIVE 03/13/2017 1905   LEUKOCYTESUR MODERATE (A) 03/13/2017 1905    STUDIES: No results found.   ELIGIBLE FOR AVAILABLE RESEARCH PROTOCOL:  no  ASSESSMENT: 51 y.o. [place] woman  (1) status post right breast upper outer quadrant lumpectomy and sentinel lymph node sampling 04/15/2007 for a pT2 pN1, stage IIIB invasive ductal carcinoma, grade 3, triple negative, with an MIB-1 of 59%  (a) adjuvant chemotherapy consisted of doxorubicin and cyclophosphamide in dose dense fashion x4 followed by weekly paclitaxel x12  (b) received adjuvant radiation  (c) a total of 4 right axillary lymph nodes were removed  (2) status post right breast upper inner quadrant biopsy 09/22/2020 for a clinical T2 N2, stage IIIC invasive ductal carcinoma, grade 3, triple negative, with an MIB-1 of 80%.  (a) chest CT 10/28/2020 shows indeterminate 0.3 cm or smaller pulmonary nodules  (b) bone scan 10/29/2020 shows no evidence of metatstatic disease  (3) genetics testing 10/19/2020 through the Common hereditary Cancers panel offered by [facility] shows no deleterious mutations  (4) neoadjuvant chemotherapy started 10/14/2020 consisting of pembrolizumab given every 3 weeks, with carboplatin + paclitaxel given weekly, 3 weeks on, one week off, wityh 4 cycles planned (12 carbo/Taxol doses), last dose 12/17/2020  (a) final 4 cycles of chemotherapy omitted because of neuropathy  (b) breast MRI 12/24/2020 shows near complete resolution of malignancy  (5) status post right mastectomy and sentinel lymph node sampling 02/24/2021 showing a residual ypT1a ypN0 invasive ductal carcinoma, grade 3, with ample margins  (a) a single right axillary lymph node was removed  (6) to continue pembrolizumab to total one year (9 post-op doses)   PLAN:   Deyani continues on pembrolizumab every 3 weeks with good tolerance.   Today is her 8 th cycle of adjuvant keytruda after surgery. We discussed that there is no clear role for adj xeloda, this was not allowed in [facility] 522 regimen.  She will receive 9 cycle of adjuvant Keytruda in 3 weeks and following that she will continue  active surveillance. She does not want to try a mammogram of her left breast until late summer because she is still very sore in the right breast after the breast reduction and breast lift. Return to clinic in 3 weeks.  Total encounter time: 30 minutes in face  to face visit time, chart review, lab review, order entry, and documentation of the encounter.   *Total Encounter Time as defined by the Centers for Medicare and Medicaid Services includes, in addition to the face-to-face time of a patient visit (documented in the note above) non-face-to-face time: obtaining and reviewing outside history, ordering and reviewing medications, tests or procedures, care coordination (communications with other health care professionals or caregivers) and documentation in the medical record.

## 2021-10-20 NOTE — Patient Instructions (Signed)
[facility] MEDICAL ONCOLOGY   ?Discharge Instructions: ?Thank you for choosing [facility] to provide your oncology and hematology care.  ? ?If you have a lab appointment with the Cancer Center, please go directly to the Cancer Center and check in at the registration area. ?  ?Wear comfortable clothing and clothing appropriate for easy access to any Portacath or PICC line.  ? ?We strive to give you quality time with your provider. You may need to reschedule your appointment if you arrive late (15 or more minutes).  Arriving late affects you and other patients whose appointments are after yours.  Also, if you miss three or more appointments without notifying the office, you may be dismissed from the clinic at the provider?s discretion.    ?  ?For prescription refill requests, have your pharmacy contact our office and allow 72 hours for refills to be completed.   ? ?Today you received the following chemotherapy and/or immunotherapy agents: pembrolizumab    ?  ?To help prevent nausea and vomiting after your treatment, we encourage you to take your nausea medication as directed. ? ?BELOW ARE SYMPTOMS THAT SHOULD BE REPORTED IMMEDIATELY: ?*FEVER GREATER THAN 100.4 F (38 ?C) OR HIGHER ?*CHILLS OR SWEATING ?*NAUSEA AND VOMITING THAT IS NOT CONTROLLED WITH YOUR NAUSEA MEDICATION ?*UNUSUAL SHORTNESS OF BREATH ?*UNUSUAL BRUISING OR BLEEDING ?*URINARY PROBLEMS (pain or burning when urinating, or frequent urination) ?*BOWEL PROBLEMS (unusual diarrhea, constipation, pain near the anus) ?TENDERNESS IN MOUTH AND THROAT WITH OR WITHOUT PRESENCE OF ULCERS (sore throat, sores in mouth, or a toothache) ?UNUSUAL RASH, SWELLING OR PAIN  ?UNUSUAL VAGINAL DISCHARGE OR ITCHING  ? ?Items with * indicate a potential emergency and should be followed up as soon as possible or go to the Emergency Department if any problems should occur. ? ?Please show the CHEMOTHERAPY ALERT CARD or IMMUNOTHERAPY ALERT CARD at  check-in to the Emergency Department and triage nurse. ? ?Should you have questions after your visit or need to cancel or reschedule your appointment, please contact [facility]ICAL [facility]COLOG[facility]  Dept: [phone]  and follow the prompts.  Office hours are 8:00 a.m. to 4:30 p.m. Monday - Friday. Please note that voicemails left after 4:00 p.m. may not be returned until the following business day.  We are closed weekends and major holidays. You have access to a nurse at all times for urgent questions. Please call the main number to the clinic Dept: [phone] and follow the prompts. ? ? ?For any non-urgent questions, you may also contact your provider using MyChart. We now offer e-Visits for anyone 18 and older to request care online for non-urgent symptoms. For details visit [email or web address]. ?  ?Also download the MyChart app! Go to the app store, search "MyChart", open the app, select , and log in with your MyChart username and password. ? ?Due to Covid, a mask is required upon entering the hospital/clinic. If you do not have a mask, one will be given to you upon arrival. For doctor visits, patients may have 1 support person aged 18 or older with them. For treatment visits, patients cannot have anyone with them due to current Covid guidelines and our immunocompromised population.  ? ?

## 2021-11-11 ENCOUNTER — Inpatient Hospital Stay: Payer: 59 | Attending: Oncology

## 2021-11-11 ENCOUNTER — Inpatient Hospital Stay: Payer: 59 | Admitting: Adult Health

## 2021-11-11 ENCOUNTER — Inpatient Hospital Stay: Payer: 59

## 2021-11-11 ENCOUNTER — Other Ambulatory Visit: Payer: Self-pay | Admitting: Hematology

## 2021-11-11 ENCOUNTER — Other Ambulatory Visit: Payer: Self-pay

## 2021-11-11 VITALS — BP 142/86 | Temp 98.4°F | Resp 18 | Wt 169.5 lb

## 2021-11-11 DIAGNOSIS — Z171 Estrogen receptor negative status [ER-]: Secondary | ICD-10-CM | POA: Insufficient documentation

## 2021-11-11 DIAGNOSIS — C50811 Malignant neoplasm of overlapping sites of right female breast: Secondary | ICD-10-CM | POA: Diagnosis not present

## 2021-11-11 DIAGNOSIS — C50211 Malignant neoplasm of upper-inner quadrant of right female breast: Secondary | ICD-10-CM

## 2021-11-11 DIAGNOSIS — Z5112 Encounter for antineoplastic immunotherapy: Secondary | ICD-10-CM | POA: Insufficient documentation

## 2021-11-11 DIAGNOSIS — Z79899 Other long term (current) drug therapy: Secondary | ICD-10-CM | POA: Insufficient documentation

## 2021-11-11 DIAGNOSIS — Z95828 Presence of other vascular implants and grafts: Secondary | ICD-10-CM

## 2021-11-11 LAB — CBC WITH DIFFERENTIAL/PLATELET
Abs Immature Granulocytes: 0.03 10*3/uL (ref 0.00–0.07)
Basophils Absolute: 0 10*3/uL (ref 0.0–0.1)
Basophils Relative: 0 %
Eosinophils Absolute: 0.4 10*3/uL (ref 0.0–0.5)
Eosinophils Relative: 5 %
HCT: 33.8 % — ABNORMAL LOW (ref 36.0–46.0)
Hemoglobin: 10.5 g/dL — ABNORMAL LOW (ref 12.0–15.0)
Immature Granulocytes: 0 %
Lymphocytes Relative: 30 %
Lymphs Abs: 2.3 10*3/uL (ref 0.7–4.0)
MCH: 26.8 pg (ref 26.0–34.0)
MCHC: 31.1 g/dL (ref 30.0–36.0)
MCV: 86.2 fL (ref 80.0–100.0)
Monocytes Absolute: 0.4 10*3/uL (ref 0.1–1.0)
Monocytes Relative: 5 %
Neutro Abs: 4.4 10*3/uL (ref 1.7–7.7)
Neutrophils Relative %: 60 %
Platelets: 249 10*3/uL (ref 150–400)
RBC: 3.92 MIL/uL (ref 3.87–5.11)
RDW: 14.8 % (ref 11.5–15.5)
WBC: 7.5 10*3/uL (ref 4.0–10.5)
nRBC: 0 % (ref 0.0–0.2)

## 2021-11-11 LAB — TSH: TSH: 3.002 u[IU]/mL (ref 0.308–3.960)

## 2021-11-11 LAB — COMPREHENSIVE METABOLIC PANEL
ALT: 11 U/L (ref 0–44)
AST: 14 U/L — ABNORMAL LOW (ref 15–41)
Albumin: 3.9 g/dL (ref 3.5–5.0)
Alkaline Phosphatase: 92 U/L (ref 38–126)
Anion gap: 6 (ref 5–15)
BUN: 11 mg/dL (ref 6–20)
CO2: 27 mmol/L (ref 22–32)
Calcium: 9 mg/dL (ref 8.9–10.3)
Chloride: 104 mmol/L (ref 98–111)
Creatinine, Ser: 0.9 mg/dL (ref 0.44–1.00)
GFR, Estimated: >60 mL/min (ref >60)
Glucose, Bld: 123 mg/dL — ABNORMAL HIGH (ref 70–99)
Potassium: 3.3 mmol/L — ABNORMAL LOW (ref 3.5–5.1)
Sodium: 137 mmol/L (ref 135–145)
Total Bilirubin: 0.2 mg/dL — ABNORMAL LOW (ref 0.3–1.2)
Total Protein: 7.1 g/dL (ref 6.5–8.1)

## 2021-11-11 MED ORDER — SODIUM CHLORIDE 0.9 % IV SOLN: Freq: Once | INTRAVENOUS | Status: AC

## 2021-11-11 MED ORDER — SODIUM CHLORIDE 0.9% FLUSH
10.0000 mL | INTRAVENOUS | Status: DC | PRN
  Administered 2021-11-11: 10 mL

## 2021-11-11 MED ORDER — HEPARIN SOD (PORK) LOCK FLUSH 100 UNIT/ML IV SOLN
500.0000 [IU] | Freq: Once | INTRAVENOUS | Status: AC | PRN
  Administered 2021-11-11: 500 [IU]

## 2021-11-11 MED ORDER — SODIUM CHLORIDE 0.9% FLUSH
10.0000 mL | Freq: Once | INTRAVENOUS | Status: AC
  Administered 2021-11-11: 10 mL

## 2021-11-11 MED ORDER — SODIUM CHLORIDE 0.9 % IV SOLN
200.0000 mg | Freq: Once | INTRAVENOUS | Status: AC
  Administered 2021-11-11: 200 mg via INTRAVENOUS
  Filled 2021-11-11: qty 200

## 2021-11-11 NOTE — Patient Instructions (Signed)
[facility]ICAL [facility]COLOG[facility]   ?Discharge Instructions: ?Thank you for choosing [facility] to provide your oncology and hematology care.  ? ?If you have a lab appointment with the Cancer Center, please go directly to the Cancer Center and check in at the registration area. ?  ?Wear comfortable clothing and clothing appropriate for easy access to any Portacath or PICC line.  ? ?We strive to give you quality time with your provider. You may need to reschedule your appointment if you arrive late (15 or more minutes).  Arriving late affects you and other patients whose appointments are after yours.  Also, if you miss three or more appointments without notifying the office, you may be dismissed from the clinic at the provider?s discretion.    ?  ?For prescription refill requests, have your pharmacy contact our office and allow 72 hours for refills to be completed.   ? ?Today you received the following chemotherapy and/or immunotherapy agents: pembrolizumab    ?  ?To help prevent nausea and vomiting after your treatment, we encourage you to take your nausea medication as directed. ? ?BELOW ARE SYMPTOMS THAT SHOULD BE REPORTED IMMEDIATELY: ?*FEVER GREATER THAN 100.4 F (38 ?C) OR HIGHER ?*CHILLS OR SWEATING ?*NAUSEA AND VOMITING THAT IS NOT CONTROLLED WITH YOUR NAUSEA MEDICATION ?*UNUSUAL SHORTNESS OF BREATH ?*UNUSUAL BRUISING OR BLEEDING ?*URINARY PROBLEMS (pain or burning when urinating, or frequent urination) ?*BOWEL PROBLEMS (unusual diarrhea, constipation, pain near the anus) ?TENDERNESS IN MOUTH AND THROAT WITH OR WITHOUT PRESENCE OF ULCERS (sore throat, sores in mouth, or a toothache) ?UNUSUAL RASH, SWELLING OR PAIN  ?UNUSUAL VAGINAL DISCHARGE OR ITCHING  ? ?Items with * indicate a potential emergency and should be followed up as soon as possible or go to the Emergency Department if any problems should occur. ? ?Please show the CHEMOTHERAPY ALERT CARD or IMMUNOTHERAPY ALERT CARD at  check-in to the Emergency Department and triage nurse. ? ?Should you have questions after your visit or need to cancel or reschedule your appointment, please contact [facility]COL[facility]  Dept: [phone]  and follow the prompts.  Office hours are 8:00 a.m. to 4:30 p.m. Monday - Friday. Please note that voicemails left after 4:00 p.m. may not be returned until the following business day.  We are closed weekends and major holidays. You have access to a nurse at all times for urgent questions. Please call the main number to the clinic Dept: [phone] and follow the prompts. ? ? ?For any non-urgent questions, you may also contact your provider using MyChart. We now offer e-Visits for anyone 18 and older to request care online for non-urgent symptoms. For details visit [email or web address]. ?  ?Also download the MyChart app! Go to the app store, search "MyChart", open the app, select [facility], and log in with your MyChart username and password. ? ?Due to Covid, a mask is required upon entering the hospital/clinic. If you do not have a mask, one will be given to you upon arrival. For doctor visits, patients may have 1 support person aged 18 or older with them. For treatment visits, patients cannot have anyone with them due to current Covid guidelines and our immunocompromised population.  ? ?

## 2021-11-16 ENCOUNTER — Encounter: Payer: Self-pay | Admitting: Hematology and Oncology

## 2021-11-16 NOTE — Progress Notes (Signed)
This encounter was created in error - please disregard.

## 2021-12-23 ENCOUNTER — Telehealth: Payer: Self-pay | Admitting: *Deleted

## 2021-12-27 ENCOUNTER — Other Ambulatory Visit: Payer: Self-pay

## 2021-12-27 ENCOUNTER — Inpatient Hospital Stay: Payer: 59 | Attending: Oncology | Admitting: Adult Health

## 2022-01-03 ENCOUNTER — Inpatient Hospital Stay: Payer: 59 | Admitting: Adult Health

## 2022-01-11 ENCOUNTER — Inpatient Hospital Stay: Payer: 59 | Admitting: Adult Health

## 2022-01-17 ENCOUNTER — Telehealth: Payer: Self-pay

## 2022-01-17 NOTE — Telephone Encounter (Signed)
Called and left VM for pt to return call - need to complete her SCP which can be done virtually/over the phone.  ?

## 2022-03-27 ENCOUNTER — Other Ambulatory Visit: Payer: Self-pay

## 2022-03-28 ENCOUNTER — Inpatient Hospital Stay: Payer: 59 | Attending: Oncology | Admitting: Adult Health

## 2022-03-28 ENCOUNTER — Encounter: Payer: Self-pay | Admitting: Adult Health

## 2022-03-28 DIAGNOSIS — Z8042 Family history of malignant neoplasm of prostate: Secondary | ICD-10-CM | POA: Insufficient documentation

## 2022-03-28 DIAGNOSIS — Z803 Family history of malignant neoplasm of breast: Secondary | ICD-10-CM | POA: Insufficient documentation

## 2022-03-28 DIAGNOSIS — Z923 Personal history of irradiation: Secondary | ICD-10-CM | POA: Diagnosis not present

## 2022-03-28 DIAGNOSIS — Z79899 Other long term (current) drug therapy: Secondary | ICD-10-CM | POA: Diagnosis not present

## 2022-03-28 DIAGNOSIS — C50411 Malignant neoplasm of upper-outer quadrant of right female breast: Secondary | ICD-10-CM

## 2022-03-28 DIAGNOSIS — C50211 Malignant neoplasm of upper-inner quadrant of right female breast: Secondary | ICD-10-CM

## 2022-03-28 DIAGNOSIS — Z9221 Personal history of antineoplastic chemotherapy: Secondary | ICD-10-CM | POA: Diagnosis not present

## 2022-03-28 DIAGNOSIS — Z171 Estrogen receptor negative status [ER-]: Secondary | ICD-10-CM | POA: Diagnosis not present

## 2022-03-28 NOTE — Progress Notes (Signed)
SURVIVORSHIP VIRTUAL VISIT:  I connected with Penny Brown on 03/28/22 at  2:15 PM EDT by my chart video and verified that I am speaking with the correct person using two identifiers.  I discussed the limitations, risks, security and privacy concerns of performing an evaluation and management service by telephone and the availability of in person appointments. I also discussed with the patient that there may be a patient responsible charge related to this service. The patient expressed understanding and agreed to proceed.  Patient location: home Provider location: [facility] office  BRIEF ONCOLOGIC HISTORY:  Oncology History  Malignant neoplasm of upper-inner quadrant of right breast in female, estrogen receptor negative ([place])  09/27/2020 Initial Diagnosis   Malignant neoplasm of upper-inner quadrant of right breast in female, estrogen receptor negative ([place])   09/29/2020 Cancer Staging   Staging form: Breast, AJCC 8th Edition - Clinical stage from 09/29/2020: Stage IIB (rcT2, cN0, cM0, G3, ER-, PR-, HER2-) - Signed by [name], MD on 09/29/2020   10/14/2020 - 12/17/2020 Chemotherapy      Patient is on Antibody Plan: BREAST PEMBROLIZUMAB Q21D    10/19/2020 Genetic Testing   Negative genetic testing:  No pathogenic variants detected on the [facility] Common Hereditary Cancers panel. The report date is 10/19/2020.   The Common Hereditary Cancers Panel offered by [facility] includes sequencing and/or deletion duplication testing of the following 48 genes: APC, ATM, AXIN2, BARD1, BMPR1A, BRCA1, BRCA2, BRIP1, CDH1, CDK4, CDKN2A (p14ARF), CDKN2A (p16INK4a), CHEK2, CTNNA1, DICER1, EPCAM (Deletion/duplication testing only), GREM1 (promoter region deletion/duplication testing only), KIT, MEN1, MLH1, MSH2, MSH3, MSH6, MUTYH, NBN, NF1, NTHL1, PALB2, PDGFRA, PMS2, POLD1, POLE, PTEN, RAD50, RAD51C, RAD51D, RNF43, SDHB, SDHC, SDHD, SMAD4, SMARCA4. STK11, TP53, TSC1, TSC2, and VHL.  The following genes were  evaluated for sequence changes only: SDHA and HOXB13 c.251G>A variant only.   11/04/2020 -  Chemotherapy   Patient is on Treatment Plan : BREAST Pembrolizumab Q21D     Malignant neoplasm of upper-outer quadrant of right breast in female, estrogen receptor negative ([place])  09/29/2020 Initial Diagnosis   Malignant neoplasm of upper-outer quadrant of right breast in female, estrogen receptor negative ([place])   09/29/2020 Cancer Staging   Staging form: Breast, AJCC 8th Edition - Clinical: cT2, cN1, cM0, ER-, PR-, HER2- - Signed by [name], MD on 09/29/2020   10/19/2020 Genetic Testing   Negative genetic testing:  No pathogenic variants detected on the [facility] Common Hereditary Cancers panel. The report date is 10/19/2020.   The Common Hereditary Cancers Panel offered by [facility] includes sequencing and/or deletion duplication testing of the following 48 genes: APC, ATM, AXIN2, BARD1, BMPR1A, BRCA1, BRCA2, BRIP1, CDH1, CDK4, CDKN2A (p14ARF), CDKN2A (p16INK4a), CHEK2, CTNNA1, DICER1, EPCAM (Deletion/duplication testing only), GREM1 (promoter region deletion/duplication testing only), KIT, MEN1, MLH1, MSH2, MSH3, MSH6, MUTYH, NBN, NF1, NTHL1, PALB2, PDGFRA, PMS2, POLD1, POLE, PTEN, RAD50, RAD51C, RAD51D, RNF43, SDHB, SDHC, SDHD, SMAD4, SMARCA4. STK11, TP53, TSC1, TSC2, and VHL.  The following genes were evaluated for sequence changes only: SDHA and HOXB13 c.251G>A variant only.     INTERVAL HISTORY:  Penny Brown to review her survivorship care plan detailing her treatment course for breast cancer, as well as monitoring long-term side effects of that treatment, education regarding health maintenance, screening, and overall wellness and health promotion.     Overall, Penny Brown reports feeling quite well.  Since completing her pembrolizumab she has experienced difficulty with her reconstruction.  She said that with the left breast reduction she ended up losing  her left nipple and having a  significant amount of scar tissue remaining.  She is having surgery in [place] on April 21, 2022.  Due to her surgery complication she has not undergone left breast screening mammogram.  Otherwise she has been feeling well since we saw her last.  She is following up with her primary care provider regularly.  She underwent port removal on 03/17/2022.  REVIEW OF SYSTEMS:  Review of Systems  Constitutional:  Negative for appetite change, chills, fatigue, fever and unexpected weight change.  HENT:   Negative for hearing loss, lump/mass and trouble swallowing.   Eyes:  Negative for eye problems and icterus.  Respiratory:  Negative for chest tightness, cough and shortness of breath.   Cardiovascular:  Negative for chest pain, leg swelling and palpitations.  Gastrointestinal:  Negative for abdominal distention, abdominal pain, constipation, diarrhea, nausea and vomiting.  Endocrine: Negative for hot flashes.  Genitourinary:  Negative for difficulty urinating.   Musculoskeletal:  Negative for arthralgias.  Skin:  Negative for itching and rash.  Neurological:  Negative for dizziness, extremity weakness, headaches and numbness.  Hematological:  Negative for adenopathy. Does not bruise/bleed easily.  Psychiatric/Behavioral:  Negative for depression. The patient is not nervous/anxious.   Breast: Denies any new nodularity, masses, tenderness, nipple changes, or nipple discharge.      ONCOLOGY TREATMENT TEAM:  1. Surgeon:  Dr. Donne Hazel at [facility] 2. Medical Oncologist: Dr. Chryl Heck  3. Radiation Oncologist: Dr. Lisbeth Renshaw 4. Plastic Surgeon: Dr. Mercy Moore in [place]    PAST MEDICAL/SURGICAL HISTORY:  Past Medical History:  Diagnosis Date   Breast cancer [facility] 2008   Right Breast Cancer   Breast cancer ([place]) 09/2020   Right Breast   Cancer ([place])    Family history of breast cancer    Family history of prostate cancer    Personal history of chemotherapy 2008   Right  Breast Cancer   Personal history of radiation therapy 2008   Right Breast Cancer   Past Surgical History:  Procedure Laterality Date   BREAST LUMPECTOMY Right 2008   BREAST REDUCTION SURGERY Left 06/03/2021   Procedure: MAMMARY REDUCTION  (BREAST);  Surgeon: Irene Limbo, MD;  Location: [place];  Service: Plastics;  Laterality: Left;   CESAREAN SECTION     IR CV LINE INJECTION  08/25/2021   MASTECTOMY W/ SENTINEL NODE BIOPSY Right 02/24/2021   Procedure: RIGHT MASTECTOMY WITH RIGHT AXILLARY SENTINEL LYMPH NODE BIOPSY;  Surgeon: [name], MD;  Location: [place];  Service: General;  Laterality: Right;   PORTACATH PLACEMENT Left 10/13/2020   Procedure: INSERTION PORT-A-CATH;  Surgeon: [name], MD;  Location: [place];  Service: General;  Laterality: Left;  LEAVE PORT ACCESSED CHEMO STARTS ON 2/10, LEFT SIDE     ALLERGIES:  No Known Allergies   CURRENT MEDICATIONS:  Outpatient Encounter Medications as of 03/28/2022  Medication Sig   albuterol (VENTOLIN HFA) 108 (90 Base) MCG/ACT inhaler Inhale 2 puffs into the lungs every 6 (six) hours as needed for wheezing or shortness of breath.   lidocaine-prilocaine (EMLA) cream Apply 1 application topically as needed (port access).   LORazepam (ATIVAN) 0.5 MG tablet Take one tablet before coming to cancer center for chemo treatment (Patient taking differently: Take 0.5 mg by mouth See admin instructions. Take one tablet before coming to cancer center for chemo treatment)   methocarbamol (ROBAXIN) 750 MG tablet Take 1 tablet (750 mg total) by mouth every 8 (eight) hours  as needed (use for muscle cramps/pain).   traMADol (ULTRAM) 50 MG tablet Take 1 tablet (50 mg total) by mouth every 6 (six) hours as needed. (Patient taking differently: Take 50 mg by mouth every 6 (six) hours as needed for severe pain.)   [DISCONTINUED] prochlorperazine (COMPAZINE) 10 MG tablet Take 1 tablet (10 mg total) by mouth  every 6 (six) hours as needed (Nausea or vomiting).   No facility-administered encounter medications on file as of 03/28/2022.     ONCOLOGIC FAMILY HISTORY:  Family History  Problem Relation Age of Onset   Breast cancer Maternal Grandmother 78   Prostate cancer Maternal Grandfather        dx 31s, metastatic      SOCIAL HISTORY:  Social History   Socioeconomic History   Marital status: Married    Spouse name: Not on file   Number of children: Not on file   Years of education: Not on file   Highest education level: Not on file  Occupational History   Not on file  Tobacco Use   Smoking status: Never   Smokeless tobacco: Never  Vaping Use   Vaping Use: Never used  Substance and Sexual Activity   Alcohol use: No   Drug use: No   Sexual activity: Not on file  Other Topics Concern   Not on file  Social History Narrative   Not on file   Social Determinants of Health   Financial Resource Strain: Low Risk  (09/29/2020)   Overall Financial Resource Strain (CARDIA)    Difficulty of Paying Living Expenses: Not hard at all  Food Insecurity: Not on file  Transportation Needs: No Transportation Needs (09/29/2020)   PRAPARE - Hydrologist (Medical): No    Lack of Transportation (Non-Medical): No  Physical Activity: Not on file  Stress: Not on file  Social Connections: Not on file  Intimate Partner Violence: Not At Risk (01/18/2021)   Humiliation, Afraid, Rape, and Kick questionnaire    Fear of Current or Ex-Partner: No    Emotionally Abused: No    Physically Abused: No    Sexually Abused: No     OBSERVATIONS/OBJECTIVE:  Patient appears well.  She is in no apparent distress, mood and behavior are normal, speech is normal, breathing is non labored  LABORATORY DATA:  None for this visit.  DIAGNOSTIC IMAGING:  None for this visit.      ASSESSMENT AND PLAN:  Ms.. Brown is a pleasant 51 y.o. female with Stage IIB right breast invasive  ductal carcinoma, ER-/PR-/HER2-, diagnosed in 09/2020, treated with neoadjuvant chemotherapy, right mastectomy, left breast reduction, and, maintenance pembrolizumab she presents to the Survivorship Clinic for our initial meeting and routine follow-up post-completion of treatment for breast cancer.    1. Stage IIB right breast cancer:  Penny Brown is continuing to recover from definitive treatment for breast cancer. She will follow-up with her medical oncologist, Dr. Chryl Heck in 6 months with history and physical exam per surveillance protocol.  Due to difficulty with left breast reconstruction she is deferring to her plastic surgeon about when to undergo mammography.  She is going to proceed with reconstruction on 04/21/2022 in [place].    Today, a comprehensive survivorship care plan and treatment summary was reviewed with the patient today detailing her breast cancer diagnosis, treatment course, potential late/long-term effects of treatment, appropriate follow-up care with recommendations for the future, and patient education resources.  A copy of this summary, along with a letter  will be sent to the patient's primary care provider via mail/fax/In Basket message after today's visit.    2. Bone health:   She was given education on specific activities to promote bone health.  3. Cancer screening:  Due to Penny Brown's history and her age, she should receive screening for skin cancers, colon cancer, and gynecologic cancers.  The information and recommendations are listed on the patient's comprehensive care plan/treatment summary and were reviewed in detail with the patient.    4. Health maintenance and wellness promotion: Penny Brown was encouraged to consume 5-7 servings of fruits and vegetables per day. We reviewed the "Nutrition Rainbow" handout.  She was also encouraged to engage in moderate to vigorous exercise for 30 minutes per day most days of the week. We discussed the [employer] fitness  program, which is designed for cancer survivors to help them become more physically fit after cancer treatments.  She was instructed to limit her alcohol consumption and continue to abstain from tobacco use.     5. Support services/counseling: It is not uncommon for this period of the patient's cancer care trajectory to be one of many emotions and stressors. She was given information regarding our available services and encouraged to contact me with any questions or for help enrolling in any of our support group/programs.    Follow up instructions:    -Return to cancer center in 6 months for f/u with Dr. Chryl Heck  -Mammogram due per Dr. Mercy Moore -She is welcome to return back to the Survivorship Clinic at any time; no additional follow-up needed at this time.  -Consider referral back to survivorship as a long-term survivor for continued surveillance  The patient was provided an opportunity to ask questions and all were answered. The patient agreed with the plan and demonstrated an understanding of the instructions.   Total encounter time:20 minutes*in face-to-face visit time, chart review, lab review, care coordination, order entry, and documentation of the encounter time.    Wilber Bihari, NP 03/28/22 2:23 PM Medical Oncology and Hematology [facility] [place], [place] [address] Tel. [phone]    Fax. [phone]  *Total Encounter Time as defined by the Centers for Medicare and Medicaid Services includes, in addition to the face-to-face time of a patient visit (documented in the note above) non-face-to-face time: obtaining and reviewing outside history, ordering and reviewing medications, tests or procedures, care coordination (communications with other health care professionals or caregivers) and documentation in the medical record.

## 2022-04-04 DIAGNOSIS — Z9011 Acquired absence of right breast and nipple: Secondary | ICD-10-CM | POA: Insufficient documentation

## 2022-04-04 DIAGNOSIS — Z9221 Personal history of antineoplastic chemotherapy: Secondary | ICD-10-CM | POA: Insufficient documentation

## 2022-04-04 DIAGNOSIS — Z923 Personal history of irradiation: Secondary | ICD-10-CM | POA: Insufficient documentation

## 2022-04-04 DIAGNOSIS — Z853 Personal history of malignant neoplasm of breast: Secondary | ICD-10-CM | POA: Insufficient documentation

## 2022-04-27 ENCOUNTER — Other Ambulatory Visit: Payer: Self-pay

## 2022-04-27 ENCOUNTER — Inpatient Hospital Stay (HOSPITAL_COMMUNITY)
Admission: EM | Admit: 2022-04-27 | Discharge: 2022-05-01 | DRG: 378 | Disposition: A | Discharge status: Home / Self Care | Payer: 59 | Attending: Internal Medicine | Admitting: Internal Medicine

## 2022-04-27 DIAGNOSIS — Z79899 Other long term (current) drug therapy: Secondary | ICD-10-CM

## 2022-04-27 DIAGNOSIS — K264 Chronic or unspecified duodenal ulcer with hemorrhage: Secondary | ICD-10-CM | POA: Diagnosis not present

## 2022-04-27 DIAGNOSIS — E663 Overweight: Secondary | ICD-10-CM | POA: Diagnosis present

## 2022-04-27 DIAGNOSIS — K449 Diaphragmatic hernia without obstruction or gangrene: Secondary | ICD-10-CM | POA: Diagnosis present

## 2022-04-27 DIAGNOSIS — D638 Anemia in other chronic diseases classified elsewhere: Secondary | ICD-10-CM | POA: Diagnosis present

## 2022-04-27 DIAGNOSIS — Z9011 Acquired absence of right breast and nipple: Secondary | ICD-10-CM

## 2022-04-27 DIAGNOSIS — D72829 Elevated white blood cell count, unspecified: Secondary | ICD-10-CM | POA: Diagnosis present

## 2022-04-27 DIAGNOSIS — Z803 Family history of malignant neoplasm of breast: Secondary | ICD-10-CM

## 2022-04-27 DIAGNOSIS — Z9221 Personal history of antineoplastic chemotherapy: Secondary | ICD-10-CM

## 2022-04-27 DIAGNOSIS — K5900 Constipation, unspecified: Secondary | ICD-10-CM | POA: Diagnosis present

## 2022-04-27 DIAGNOSIS — K2960 Other gastritis without bleeding: Secondary | ICD-10-CM | POA: Diagnosis present

## 2022-04-27 DIAGNOSIS — K922 Gastrointestinal hemorrhage, unspecified: Secondary | ICD-10-CM | POA: Diagnosis present

## 2022-04-27 DIAGNOSIS — Z6829 Body mass index (BMI) 29.0-29.9, adult: Secondary | ICD-10-CM

## 2022-04-27 DIAGNOSIS — K269 Duodenal ulcer, unspecified as acute or chronic, without hemorrhage or perforation: Secondary | ICD-10-CM

## 2022-04-27 DIAGNOSIS — Z853 Personal history of malignant neoplasm of breast: Secondary | ICD-10-CM

## 2022-04-27 DIAGNOSIS — K920 Hematemesis: Principal | ICD-10-CM

## 2022-04-27 DIAGNOSIS — E871 Hypo-osmolality and hyponatremia: Secondary | ICD-10-CM | POA: Diagnosis present

## 2022-04-27 DIAGNOSIS — Z923 Personal history of irradiation: Secondary | ICD-10-CM

## 2022-04-27 DIAGNOSIS — K319 Disease of stomach and duodenum, unspecified: Secondary | ICD-10-CM | POA: Diagnosis present

## 2022-04-27 DIAGNOSIS — D62 Acute posthemorrhagic anemia: Secondary | ICD-10-CM | POA: Diagnosis present

## 2022-04-27 DIAGNOSIS — K2100 Gastro-esophageal reflux disease with esophagitis, without bleeding: Secondary | ICD-10-CM | POA: Diagnosis present

## 2022-04-27 LAB — CBC WITH DIFFERENTIAL/PLATELET
Abs Immature Granulocytes: 0.16 10*3/uL — ABNORMAL HIGH (ref 0.00–0.07)
Abs Immature Granulocytes: 0.29 10*3/uL — ABNORMAL HIGH (ref 0.00–0.07)
Basophils Absolute: 0 10*3/uL (ref 0.0–0.1)
Basophils Absolute: 0 10*3/uL (ref 0.0–0.1)
Basophils Relative: 0 %
Basophils Relative: 0 %
Eosinophils Absolute: 1 10*3/uL — ABNORMAL HIGH (ref 0.0–0.5)
Eosinophils Absolute: 1 10*3/uL — ABNORMAL HIGH (ref 0.0–0.5)
Eosinophils Relative: 8 %
Eosinophils Relative: 8 %
HCT: 33 % — ABNORMAL LOW (ref 36.0–46.0)
HCT: 30.4 % — ABNORMAL LOW (ref 36.0–46.0)
Hemoglobin: 9.8 g/dL — ABNORMAL LOW (ref 12.0–15.0)
Hemoglobin: 9.5 g/dL — ABNORMAL LOW (ref 12.0–15.0)
Immature Granulocytes: 1 %
Immature Granulocytes: 2 %
Lymphocytes Relative: 17 %
Lymphocytes Relative: 21 %
Lymphs Abs: 2 10*3/uL (ref 0.7–4.0)
Lymphs Abs: 2.7 10*3/uL (ref 0.7–4.0)
MCH: 27.1 pg (ref 26.0–34.0)
MCH: 27.7 pg (ref 26.0–34.0)
MCHC: 29.7 g/dL — ABNORMAL LOW (ref 30.0–36.0)
MCHC: 31.3 g/dL (ref 30.0–36.0)
MCV: 91.4 fL (ref 80.0–100.0)
MCV: 88.6 fL (ref 80.0–100.0)
Monocytes Absolute: 0.7 10*3/uL (ref 0.1–1.0)
Monocytes Absolute: 0.8 10*3/uL (ref 0.1–1.0)
Monocytes Relative: 6 %
Monocytes Relative: 6 %
Neutro Abs: 8 10*3/uL — ABNORMAL HIGH (ref 1.7–7.7)
Neutro Abs: 7.9 10*3/uL — ABNORMAL HIGH (ref 1.7–7.7)
Neutrophils Relative %: 68 %
Neutrophils Relative %: 63 %
Platelets: 348 10*3/uL (ref 150–400)
Platelets: 372 10*3/uL (ref 150–400)
RBC: 3.61 MIL/uL — ABNORMAL LOW (ref 3.87–5.11)
RBC: 3.43 MIL/uL — ABNORMAL LOW (ref 3.87–5.11)
RDW: 15.6 % — ABNORMAL HIGH (ref 11.5–15.5)
RDW: 15.7 % — ABNORMAL HIGH (ref 11.5–15.5)
WBC: 11.9 10*3/uL — ABNORMAL HIGH (ref 4.0–10.5)
WBC: 12.7 10*3/uL — ABNORMAL HIGH (ref 4.0–10.5)
nRBC: 0 % (ref 0.0–0.2)
nRBC: 0 % (ref 0.0–0.2)

## 2022-04-27 LAB — PROTIME-INR
INR: 1.2 (ref 0.8–1.2)
Prothrombin Time: 14.6 seconds (ref 11.4–15.2)

## 2022-04-27 LAB — COMPREHENSIVE METABOLIC PANEL
ALT: 12 U/L (ref 0–44)
AST: 17 U/L (ref 15–41)
Albumin: 3.2 g/dL — ABNORMAL LOW (ref 3.5–5.0)
Alkaline Phosphatase: 61 U/L (ref 38–126)
Anion gap: 9 (ref 5–15)
BUN: 12 mg/dL (ref 6–20)
CO2: 24 mmol/L (ref 22–32)
Calcium: 9.1 mg/dL (ref 8.9–10.3)
Chloride: 101 mmol/L (ref 98–111)
Creatinine, Ser: 0.68 mg/dL (ref 0.44–1.00)
GFR, Estimated: >60 mL/min (ref >60)
Glucose, Bld: 102 mg/dL — ABNORMAL HIGH (ref 70–99)
Potassium: 4.2 mmol/L (ref 3.5–5.1)
Sodium: 134 mmol/L — ABNORMAL LOW (ref 135–145)
Total Bilirubin: 0.3 mg/dL (ref 0.3–1.2)
Total Protein: 6.9 g/dL (ref 6.5–8.1)

## 2022-04-27 LAB — LIPASE, BLOOD: Lipase: 28 U/L (ref 11–51)

## 2022-04-27 MED ORDER — ONDANSETRON HCL 4 MG/2ML IJ SOLN
4.0000 mg | Freq: Once | INTRAMUSCULAR | Status: AC
  Administered 2022-04-27: 4 mg via INTRAVENOUS
  Filled 2022-04-27: qty 2

## 2022-04-27 MED ORDER — LACTATED RINGERS IV BOLUS
1000.0000 mL | Freq: Once | INTRAVENOUS | Status: AC
  Administered 2022-04-27: 1000 mL via INTRAVENOUS

## 2022-04-27 NOTE — ED Triage Notes (Signed)
Pt had D flap procedure last Friday by Dr. Deatra James in [place] and has been recovering without complication; however, approximately 30 mins PTA patient had one episode of hematemesis. Pt denies pain.

## 2022-04-27 NOTE — ED Provider Notes (Signed)
[facility] EMERGENCY DEPARTMENT Provider Note   CSN: [id number] Arrival date & time: 04/27/22  1343     History  Chief Complaint  Patient presents with   Hematemesis    Penny Brown is a 51 y.o. female.  With past medical history of breast cancer/PE right mastectomy and recent DIEP reconstructive surgery on the right breast on Friday, 04/21/2022 who presents to the emergency department with hematemesis.  States that she had surgery last Friday and has been doing well recovering at home.  She states that yesterday she noticed that she felt slightly nauseated.  She states that then today around noon she became nauseated again and had an episode of hematemesis.  She describes it as a burgundy color.  She states that she had relief of her symptoms after she vomited and had no further episodes.  She currently does not feel nauseated.  She denies bright red blood in the vomit, hematochezia or melena. Denies abdominal pain. Her first bowel movement was today from her surgery. She denies shortness of breath or palpitations.  She is not anticoagulated.  She states that she called her surgeon Dr. Deatra James at [facility] in [place] who instructed her to present to the emergency department for imaging.   HPI     Home Medications Prior to Admission medications   Medication Sig Start Date End Date Taking? Authorizing Provider  albuterol (VENTOLIN HFA) 108 (90 Base) MCG/ACT inhaler Inhale 2 puffs into the lungs every 6 (six) hours as needed for wheezing or shortness of breath. 05/26/21   Gardenia Phlegm, NP  lidocaine-prilocaine (EMLA) cream Apply 1 application topically as needed (port access). 05/26/21   Gardenia Phlegm, NP  LORazepam (ATIVAN) 0.5 MG tablet Take one tablet before coming to cancer center for chemo treatment Patient taking differently: Take 0.5 mg by mouth See admin instructions. Take one tablet before coming to cancer center for chemo  treatment 11/25/20   Magrinat, [name], MD  methocarbamol (ROBAXIN) 750 MG tablet Take 1 tablet (750 mg total) by mouth every 8 (eight) hours as needed (use for muscle cramps/pain). 02/25/21   [name], MD  traMADol (ULTRAM) 50 MG tablet Take 1 tablet (50 mg total) by mouth every 6 (six) hours as needed. Patient taking differently: Take 50 mg by mouth every 6 (six) hours as needed for severe pain. 10/13/20   [name], MD  prochlorperazine (COMPAZINE) 10 MG tablet Take 1 tablet (10 mg total) by mouth every 6 (six) hours as needed (Nausea or vomiting). 09/30/20 01/13/21  Magrinat, [name], MD      Allergies    Patient has no known allergies.    Review of Systems   Review of Systems  Constitutional:  Positive for appetite change.  Respiratory:  Negative for shortness of breath.   Cardiovascular:  Negative for chest pain.  Gastrointestinal:  Positive for nausea and vomiting. Negative for abdominal distention and abdominal pain.  All other systems reviewed and are negative.   Physical Exam Updated Vital Signs BP 121/82 (BP Location: Left Arm)   Pulse (!) 111   Temp 97.9 F (36.6 C)   Resp (!) 23   SpO2 97%  Physical Exam Vitals and nursing note reviewed.  Constitutional:      General: She is not in acute distress.    Appearance: Normal appearance. She is ill-appearing. She is not toxic-appearing.  HENT:     Head: Normocephalic and atraumatic.     Mouth/Throat:  Mouth: Mucous membranes are moist.     Pharynx: Oropharynx is clear.  Eyes:     General: No scleral icterus.    Extraocular Movements: Extraocular movements intact.     Pupils: Pupils are equal, round, and reactive to light.  Cardiovascular:     Rate and Rhythm: Regular rhythm. Tachycardia present.     Pulses: Normal pulses.     Heart sounds: No murmur heard. Pulmonary:     Effort: Pulmonary effort is normal. No respiratory distress.     Breath sounds: Normal breath sounds.  Abdominal:      General: Abdomen is protuberant. Bowel sounds are normal. There is no distension.     Palpations: Abdomen is soft.     Tenderness: There is abdominal tenderness.     Comments: Large, lower abdominal horizontal scar below the umbilicus with drains in place. Appears well healing. Abdomen is soft. Mildly tender post-operatively. +BS  Musculoskeletal:        General: Normal range of motion.     Cervical back: Neck supple.  Skin:    General: Skin is warm and dry.     Capillary Refill: Capillary refill takes less than 2 seconds.     Coloration: Skin is not pale.  Neurological:     General: No focal deficit present.     Mental Status: She is alert and oriented to person, place, and time. Mental status is at baseline.  Psychiatric:        Mood and Affect: Mood normal.        Behavior: Behavior normal.        Thought Content: Thought content normal.        Judgment: Judgment normal.    ED Results / Procedures / Treatments   Labs (all labs ordered are listed, but only abnormal results are displayed) Labs Reviewed  CBC WITH DIFFERENTIAL/PLATELET - Abnormal; Notable for the following components:      Result Value   WBC 11.9 (*)    RBC 3.61 (*)    Hemoglobin 9.8 (*)    HCT 33.0 (*)    MCHC 29.7 (*)    RDW 15.6 (*)    Neutro Abs 8.0 (*)    Eosinophils Absolute 1.0 (*)    Abs Immature Granulocytes 0.16 (*)    All other components within normal limits  COMPREHENSIVE METABOLIC PANEL - Abnormal; Notable for the following components:   Sodium 134 (*)    Glucose, Bld 102 (*)    Albumin 3.2 (*)    All other components within normal limits  LIPASE, BLOOD  URINALYSIS, ROUTINE W REFLEX MICROSCOPIC  PROTIME-INR  CBC WITH DIFFERENTIAL/PLATELET  TYPE AND SCREEN   EKG None  Radiology No results found.  Procedures Procedures   Medications Ordered in ED Medications  ondansetron [facility]) injection 4 mg (4 mg Intravenous Given 04/27/22 2303)  lactated ringers bolus 1,000 mL (1,000 mLs  Intravenous New Bag/Given 04/27/22 2319)    ED Course/ Medical Decision Making/ A&P Clinical Course as of 04/27/22 2251  Thu Apr 27, 2022  2250 Episode of vomiting with small amount of blood mixed into vomitus. Ordered Zofran [LA]    Clinical Course User Index [LA] Mickie Hillier, PA-C                           Medical Decision Making Amount and/or Complexity of Data Reviewed Labs: ordered. Radiology: ordered.  Risk Prescription drug management.  Care of patient being handed  off to [place], PA-C at shift change. Patient is pending CT imaging of her chest and abdomen. Also pending repeat CBC given ongoing vomiting. I ordered a type and screen and coagulation studies as needed.  She has been given zofran and started on IVF given her tachycardia and soft BP. At this time, patient will need completed work up before dispo decision. She will need CTA PE study for her hematemesis, tachycardia hx cancer.  Scanning her abdomen with contrast for any emergent abnormalities. Otherwise supportive measures.  Final dispo pending. Please see next provider note.  Final Clinical Impression(s) / ED Diagnoses Final diagnoses:  None    Rx / DC Orders ED Discharge Orders     None         Mickie Hillier, PA-C 04/28/22 0019    Jeanell Sparrow, DO 04/29/22 1001

## 2022-04-27 NOTE — ED Provider Triage Note (Signed)
Emergency Medicine Provider Triage Evaluation Note  Penny Brown , a 51 y.o. female  was evaluated in triage.  Pt complains of hematemesis. Report hx of breast CA s/p mastectomy and recently had reconstructive surgery last week.  Report felt nauseous and vomited 1/2 cup of blood this AM.  Have been taking NSAIDs.  No pain, no dizziness or lightheadedness  Review of Systems  Positive: As above Negative: As above  Physical Exam  BP 130/88 (BP Location: Right Arm)   Pulse (!) 122   Temp 97.7 F (36.5 C) (Oral)   Resp 17   SpO2 99%  Gen:   Awake, no distress   Resp:  Normal effort  MSK:   Moves extremities without difficulty  Other:    Medical Decision Making  Medically screening exam initiated at 2:10 PM.  Appropriate orders placed.  Penny Brown was informed that the remainder of the evaluation will be completed by another provider, this initial triage assessment does not replace that evaluation, and the importance of remaining in the ED until their evaluation is complete.     Domenic Moras, PA-C 04/27/22 1417

## 2022-04-28 ENCOUNTER — Encounter (HOSPITAL_COMMUNITY): Payer: Self-pay | Admitting: Internal Medicine

## 2022-04-28 ENCOUNTER — Emergency Department (HOSPITAL_COMMUNITY): Payer: 59

## 2022-04-28 ENCOUNTER — Observation Stay (HOSPITAL_BASED_OUTPATIENT_CLINIC_OR_DEPARTMENT_OTHER): Payer: 59 | Admitting: Anesthesiology

## 2022-04-28 ENCOUNTER — Other Ambulatory Visit (HOSPITAL_COMMUNITY): Payer: 59

## 2022-04-28 ENCOUNTER — Observation Stay (HOSPITAL_COMMUNITY): Payer: 59 | Admitting: Anesthesiology

## 2022-04-28 ENCOUNTER — Encounter (HOSPITAL_COMMUNITY)
Admission: EM | Disposition: A | Discharge status: Home / Self Care | Payer: Self-pay | Source: Home / Self Care | Attending: Internal Medicine

## 2022-04-28 DIAGNOSIS — Z853 Personal history of malignant neoplasm of breast: Secondary | ICD-10-CM | POA: Diagnosis not present

## 2022-04-28 DIAGNOSIS — Z9011 Acquired absence of right breast and nipple: Secondary | ICD-10-CM | POA: Diagnosis not present

## 2022-04-28 DIAGNOSIS — K2960 Other gastritis without bleeding: Secondary | ICD-10-CM | POA: Diagnosis present

## 2022-04-28 DIAGNOSIS — D72829 Elevated white blood cell count, unspecified: Secondary | ICD-10-CM

## 2022-04-28 DIAGNOSIS — E871 Hypo-osmolality and hyponatremia: Secondary | ICD-10-CM | POA: Diagnosis present

## 2022-04-28 DIAGNOSIS — K922 Gastrointestinal hemorrhage, unspecified: Secondary | ICD-10-CM | POA: Diagnosis present

## 2022-04-28 DIAGNOSIS — K3189 Other diseases of stomach and duodenum: Secondary | ICD-10-CM

## 2022-04-28 DIAGNOSIS — D649 Anemia, unspecified: Secondary | ICD-10-CM

## 2022-04-28 DIAGNOSIS — K920 Hematemesis: Secondary | ICD-10-CM

## 2022-04-28 DIAGNOSIS — K449 Diaphragmatic hernia without obstruction or gangrene: Secondary | ICD-10-CM | POA: Diagnosis present

## 2022-04-28 DIAGNOSIS — K2100 Gastro-esophageal reflux disease with esophagitis, without bleeding: Secondary | ICD-10-CM

## 2022-04-28 DIAGNOSIS — D638 Anemia in other chronic diseases classified elsewhere: Secondary | ICD-10-CM | POA: Diagnosis present

## 2022-04-28 DIAGNOSIS — Z923 Personal history of irradiation: Secondary | ICD-10-CM | POA: Diagnosis not present

## 2022-04-28 DIAGNOSIS — Z9221 Personal history of antineoplastic chemotherapy: Secondary | ICD-10-CM | POA: Diagnosis not present

## 2022-04-28 DIAGNOSIS — Z79899 Other long term (current) drug therapy: Secondary | ICD-10-CM | POA: Diagnosis not present

## 2022-04-28 DIAGNOSIS — D62 Acute posthemorrhagic anemia: Secondary | ICD-10-CM | POA: Diagnosis present

## 2022-04-28 DIAGNOSIS — Z803 Family history of malignant neoplasm of breast: Secondary | ICD-10-CM | POA: Diagnosis not present

## 2022-04-28 DIAGNOSIS — Z6829 Body mass index (BMI) 29.0-29.9, adult: Secondary | ICD-10-CM | POA: Diagnosis not present

## 2022-04-28 DIAGNOSIS — E663 Overweight: Secondary | ICD-10-CM | POA: Diagnosis present

## 2022-04-28 DIAGNOSIS — K269 Duodenal ulcer, unspecified as acute or chronic, without hemorrhage or perforation: Secondary | ICD-10-CM

## 2022-04-28 DIAGNOSIS — K264 Chronic or unspecified duodenal ulcer with hemorrhage: Secondary | ICD-10-CM | POA: Diagnosis present

## 2022-04-28 DIAGNOSIS — K319 Disease of stomach and duodenum, unspecified: Secondary | ICD-10-CM | POA: Diagnosis present

## 2022-04-28 DIAGNOSIS — K5900 Constipation, unspecified: Secondary | ICD-10-CM | POA: Diagnosis present

## 2022-04-28 HISTORY — PX: BIOPSY: SHX5522

## 2022-04-28 HISTORY — PX: HOT HEMOSTASIS: SHX5433

## 2022-04-28 HISTORY — PX: ESOPHAGOGASTRODUODENOSCOPY (EGD) WITH PROPOFOL: SHX5813

## 2022-04-28 LAB — IRON AND TIBC
Iron: 37 ug/dL (ref 28–170)
Saturation Ratios: 13 % (ref 10.4–31.8)
TIBC: 277 ug/dL (ref 250–450)
UIBC: 240 ug/dL

## 2022-04-28 LAB — CBC WITH DIFFERENTIAL/PLATELET
Abs Immature Granulocytes: 0.24 10*3/uL — ABNORMAL HIGH (ref 0.00–0.07)
Basophils Absolute: 0 10*3/uL (ref 0.0–0.1)
Basophils Relative: 0 %
Eosinophils Absolute: 0.8 10*3/uL — ABNORMAL HIGH (ref 0.0–0.5)
Eosinophils Relative: 6 %
HCT: 24.6 % — ABNORMAL LOW (ref 36.0–46.0)
Hemoglobin: 7.8 g/dL — ABNORMAL LOW (ref 12.0–15.0)
Immature Granulocytes: 2 %
Lymphocytes Relative: 17 %
Lymphs Abs: 2.2 10*3/uL (ref 0.7–4.0)
MCH: 27.9 pg (ref 26.0–34.0)
MCHC: 31.7 g/dL (ref 30.0–36.0)
MCV: 87.9 fL (ref 80.0–100.0)
Monocytes Absolute: 1 10*3/uL (ref 0.1–1.0)
Monocytes Relative: 8 %
Neutro Abs: 8.5 10*3/uL — ABNORMAL HIGH (ref 1.7–7.7)
Neutrophils Relative %: 67 %
Platelets: 308 10*3/uL (ref 150–400)
RBC: 2.8 MIL/uL — ABNORMAL LOW (ref 3.87–5.11)
RDW: 15.8 % — ABNORMAL HIGH (ref 11.5–15.5)
WBC: 12.7 10*3/uL — ABNORMAL HIGH (ref 4.0–10.5)
nRBC: 0 % (ref 0.0–0.2)

## 2022-04-28 LAB — RETICULOCYTES
Immature Retic Fract: 39.5 % — ABNORMAL HIGH (ref 2.3–15.9)
RBC.: 2.82 MIL/uL — ABNORMAL LOW (ref 3.87–5.11)
Retic Count, Absolute: 66 10*3/uL (ref 19.0–186.0)
Retic Ct Pct: 2.3 % (ref 0.4–3.1)

## 2022-04-28 LAB — URINALYSIS, ROUTINE W REFLEX MICROSCOPIC
Bacteria, UA: NONE SEEN
Bilirubin Urine: NEGATIVE
Glucose, UA: NEGATIVE mg/dL
Ketones, ur: 20 mg/dL — AB
Leukocytes,Ua: NEGATIVE
Nitrite: NEGATIVE
Protein, ur: NEGATIVE mg/dL
Specific Gravity, Urine: 1.038 — ABNORMAL HIGH (ref 1.005–1.030)
pH: 5 (ref 5.0–8.0)

## 2022-04-28 LAB — COMPREHENSIVE METABOLIC PANEL
ALT: 12 U/L (ref 0–44)
AST: 13 U/L — ABNORMAL LOW (ref 15–41)
Albumin: 2.8 g/dL — ABNORMAL LOW (ref 3.5–5.0)
Alkaline Phosphatase: 54 U/L (ref 38–126)
Anion gap: 8 (ref 5–15)
BUN: 17 mg/dL (ref 6–20)
CO2: 24 mmol/L (ref 22–32)
Calcium: 8.4 mg/dL — ABNORMAL LOW (ref 8.9–10.3)
Chloride: 101 mmol/L (ref 98–111)
Creatinine, Ser: 0.63 mg/dL (ref 0.44–1.00)
GFR, Estimated: >60 mL/min (ref >60)
Glucose, Bld: 107 mg/dL — ABNORMAL HIGH (ref 70–99)
Potassium: 4 mmol/L (ref 3.5–5.1)
Sodium: 133 mmol/L — ABNORMAL LOW (ref 135–145)
Total Bilirubin: 0.4 mg/dL (ref 0.3–1.2)
Total Protein: 5.8 g/dL — ABNORMAL LOW (ref 6.5–8.1)

## 2022-04-28 LAB — FERRITIN: Ferritin: 41 ng/mL (ref 11–307)

## 2022-04-28 LAB — HEMOGLOBIN AND HEMATOCRIT, BLOOD
HCT: 24.8 % — ABNORMAL LOW (ref 36.0–46.0)
HCT: 27.8 % — ABNORMAL LOW (ref 36.0–46.0)
HCT: 26.3 % — ABNORMAL LOW (ref 36.0–46.0)
Hemoglobin: 7.9 g/dL — ABNORMAL LOW (ref 12.0–15.0)
Hemoglobin: 9 g/dL — ABNORMAL LOW (ref 12.0–15.0)
Hemoglobin: 8.8 g/dL — ABNORMAL LOW (ref 12.0–15.0)

## 2022-04-28 LAB — MAGNESIUM: Magnesium: 1.7 mg/dL (ref 1.7–2.4)

## 2022-04-28 LAB — ABO/RH: ABO/RH(D): A POS

## 2022-04-28 LAB — PREPARE RBC (CROSSMATCH)

## 2022-04-28 SURGERY — ESOPHAGOGASTRODUODENOSCOPY (EGD) WITH PROPOFOL
Anesthesia: Monitor Anesthesia Care

## 2022-04-28 MED ORDER — LINACLOTIDE 72 MCG PO CAPS
72.0000 ug | ORAL_CAPSULE | Freq: Every day | ORAL | Status: DC | PRN
  Filled 2022-04-28: qty 1

## 2022-04-28 MED ORDER — SODIUM CHLORIDE 0.9 % IV SOLN: INTRAVENOUS | Status: DC

## 2022-04-28 MED ORDER — PANTOPRAZOLE SODIUM 40 MG IV SOLR: 40.0000 mg | Freq: Two times a day (BID) | INTRAVENOUS | Status: DC

## 2022-04-28 MED ORDER — ONDANSETRON HCL 4 MG/2ML IJ SOLN
4.0000 mg | Freq: Four times a day (QID) | INTRAMUSCULAR | Status: DC | PRN
  Administered 2022-04-28 (×2): 4 mg via INTRAVENOUS
  Filled 2022-04-28: qty 2

## 2022-04-28 MED ORDER — GABAPENTIN 100 MG PO CAPS
100.0000 mg | ORAL_CAPSULE | Freq: Three times a day (TID) | ORAL | Status: DC
  Administered 2022-04-28 – 2022-04-29 (×4): 100 mg via ORAL
  Filled 2022-04-28 (×5): qty 1

## 2022-04-28 MED ORDER — ONDANSETRON HCL 4 MG/2ML IJ SOLN
INTRAMUSCULAR | Status: AC
  Filled 2022-04-28: qty 2

## 2022-04-28 MED ORDER — SODIUM CHLORIDE 0.9% IV SOLUTION: Freq: Once | INTRAVENOUS | Status: AC

## 2022-04-28 MED ORDER — PANTOPRAZOLE 80MG IVPB - SIMPLE MED
80.0000 mg | Freq: Once | INTRAVENOUS | Status: AC
  Administered 2022-04-28: 80 mg via INTRAVENOUS
  Filled 2022-04-28: qty 80

## 2022-04-28 MED ORDER — LIDOCAINE 2% (20 MG/ML) 5 ML SYRINGE
INTRAMUSCULAR | Status: DC | PRN
  Administered 2022-04-28: 50 mg via INTRAVENOUS

## 2022-04-28 MED ORDER — ACETAMINOPHEN 325 MG PO TABS
650.0000 mg | ORAL_TABLET | Freq: Four times a day (QID) | ORAL | Status: DC | PRN
  Administered 2022-05-01: 650 mg via ORAL
  Filled 2022-04-28: qty 2

## 2022-04-28 MED ORDER — ACETAMINOPHEN 650 MG RE SUPP: 650.0000 mg | Freq: Four times a day (QID) | RECTAL | Status: DC | PRN

## 2022-04-28 MED ORDER — PROPOFOL 500 MG/50ML IV EMUL
INTRAVENOUS | Status: DC | PRN
  Administered 2022-04-28: 125 ug/kg/min via INTRAVENOUS

## 2022-04-28 MED ORDER — OXYCODONE HCL 5 MG PO TABS: 5.0000 mg | ORAL_TABLET | Freq: Four times a day (QID) | ORAL | Status: DC | PRN

## 2022-04-28 MED ORDER — IOHEXOL 350 MG/ML SOLN
100.0000 mL | Freq: Once | INTRAVENOUS | Status: AC | PRN
  Administered 2022-04-28: 100 mL via INTRAVENOUS

## 2022-04-28 MED ORDER — PANTOPRAZOLE INFUSION (NEW) - SIMPLE MED
8.0000 mg/h | INTRAVENOUS | Status: DC
  Administered 2022-04-28 – 2022-04-30 (×7): 8 mg/h via INTRAVENOUS
  Filled 2022-04-28: qty 80
  Filled 2022-04-28: qty 100
  Filled 2022-04-28: qty 80
  Filled 2022-04-28: qty 100
  Filled 2022-04-28: qty 80
  Filled 2022-04-28: qty 100
  Filled 2022-04-28 (×3): qty 80

## 2022-04-28 MED ORDER — PROPOFOL 10 MG/ML IV BOLUS
INTRAVENOUS | Status: DC | PRN
  Administered 2022-04-28 (×3): 10 mg via INTRAVENOUS

## 2022-04-28 SURGICAL SUPPLY — 15 items

## 2022-04-28 NOTE — Anesthesia Postprocedure Evaluation (Signed)
Anesthesia Post Note  Patient: Penny Brown  Procedure(s) Performed: ESOPHAGOGASTRODUODENOSCOPY (EGD) WITH PROPOFOL HOT HEMOSTASIS (ARGON PLASMA COAGULATION/BICAP)     Patient location during evaluation: PACU Anesthesia Type: MAC Level of consciousness: awake and alert Pain management: pain level controlled Vital Signs Assessment: post-procedure vital signs reviewed and stable Respiratory status: spontaneous breathing, nonlabored ventilation and respiratory function stable Cardiovascular status: blood pressure returned to baseline and stable Postop Assessment: no apparent nausea or vomiting Anesthetic complications: no   No notable events documented.  Last Vitals:  Vitals:   04/28/22 1403 04/28/22 1408  BP: 133/75   Pulse: (!) 103   Resp: 15   Temp:  36.8 C  SpO2: 100%     Last Pain:  Vitals:   04/28/22 1408  TempSrc: Oral  PainSc:                  Pervis Hocking

## 2022-04-28 NOTE — H&P (Signed)
History and Physical    PLEASE NOTE THAT DRAGON DICTATION SOFTWARE WAS USED IN THE CONSTRUCTION OF THIS NOTE.   [name] DJM:[id number] DOB: [birth year] DOA: 04/27/2022  PCP: [name], [place]  Patient coming from: home   I have personally briefly reviewed patient's old medical records in [place]  Chief Complaint: Hematemesis  HPI: Penny Brown is a 51 y.o. female with medical history significant for right-sided breast cancer status posttreatment right mastectomy in June 2022 followed by recent right breast reconstruction procedure, chronic anemia, who is admitted to [facility] on 04/27/2022 with suspected acute upper gastrointestinal bleed after presenting from home to [facility] ED complaining of hematemesis.   Patient with a history of right-sided breast cancer, status post right mastectomy in June 2022.  She underwent right breast reconstruction procedure in [place] on Friday, 04/21/2022.  Upon discharge from the hospital she was prescribed aspirin 325 mg p.o. daily for thromboembolic prophylactic purposes in the postoperative setting.  She has been compliantly taking this medication on a daily basis.   On 04/27/2022, the patient reports that she developed nausea resulting in an episode of dark-colored emesis, associated with coffee-ground appearance, in the absence of any bright red blood.  This been associated with some intermittent sharp epigastric discomfort.  She denies any prior history of gastrointestinal bleed, and subsequently presented to [facility] emergency department for further evaluation and management thereof.   She has continued to experience intermittent nausea, resulting in 1 additional episode of dark-colored emesis that was witnessed in the ED around 2300 on 04/27/2022.  Denies any ensuing episodes of hematemesis.   She has not noted any recent melena or hematochezia.  No recent diarrhea or preceding trauma.  No associated any recent chest pain,  shortness of breath, palpitations, diaphoresis, dizziness, presyncope, or syncope.  She also denies any recent subjective fever, chills, rigors, or generalized myalgias.  She has not previously undergone evaluation via EGD.  The patient denies any regular or recent consumption of alcohol, and denies any known history of underlying chronic liver disease.  Most recent dose of her full dose aspirin occurred on 04/26/2022.  Otherwise, she conveys that she is not on any additional blood thinners at home.  No regular or recent use of NSAIDs.  She notes that her discharge medications from hospitalization for recent right breast reconstruction included prn Motrin.  However, she conveys that she has not taken any of this medication or any other NSAIDs recently.  She has a history of chronic anemia with baseline hemoglobin range 9-11.  Per additional chart review, the following previous hemoglobin data points are noted: Hemoglobin of 10.5 on 11/11/2021, 9.0 and 04/21/2022.  Additionally, hemoglobin on postop day 1 (04/22/22) status post right breast reconstruction procedure noted to be 8.9.  She confirms that she has never previously undergone blood transfusion.     ED Course:  Vital signs in the ED were notable for the following: Afebrile; heart rate 10 3-1 22, most recently in the low 100s; blood pressure 98/65 - 130/88; respiratory rate 14-21, oxygen saturation 97 to 100% on room air.  Labs were notable for the following: CMP notable for the following: BUN 12 compared to most recent prior value of 11 on 11/12/2021, creatinine 0.68, liver enzymes within normal limits.  CBC notable for white blood cell count 11,900 compared to 11,800 on 04/22/2022, initially hemoglobin 9.8, which was noted to be normocytic, hypochromic, and with a RDW of 15.6.  Repeat hemoglobin noted to  be 9.5.  She subsequently received a 1 L LR bolus, with repeat hemoglobin then noted to be 7.9.  Platelet count 348.  INR 1.2.  Type and screen  completed.  Urinalysis showed no white blood cells, no bacteria, no red blood cells.  Imaging and additional notable ED work-up: CTA chest showed no evidence of acute pulmonary embolism, also showing no evidence of acute intrathoracic process.  CTA chest noted postsurgical changes of the right mastectomy with reconstruction, without evidence of drainable fluid collection or abscess.  CT abdomen/pelvis with contrast showed sigmoid diverticulosis but no evidence of diverticulitis and showed no evidence of acute intra-abdominal or acute intrapelvic process including no evidence of extraluminal extravasation of contrast.  While in the ED, the following were administered: In addition to aforementioned 1 L LR bolus, the patient has also received Zofran 4 mg IV x1 dose.  Subsequently, the patient was admitted for overnight observation for further evaluation management of suspected acute upper gastrointestinal bleed associated with acute blood loss anemia superimposed on chronic anemia.      Review of Systems: As per HPI otherwise 10 point review of systems negative.   Past Medical History:  Diagnosis Date   Breast cancer [facility]) 2008   Right Breast Cancer   Breast cancer ([place]) 09/2020   Right Breast   Cancer ([place])    Family history of breast cancer    Family history of prostate cancer    Personal history of chemotherapy 2008   Right Breast Cancer   Personal history of radiation therapy 2008   Right Breast Cancer   Port-A-Cath in place 10/21/2020    Past Surgical History:  Procedure Laterality Date   BREAST LUMPECTOMY Right 2008   BREAST REDUCTION SURGERY Left 06/03/2021   Procedure: MAMMARY REDUCTION  (BREAST);  Surgeon: Irene Limbo, MD;  Location: [place];  Service: Plastics;  Laterality: Left;   CESAREAN SECTION     IR CV LINE INJECTION  08/25/2021   MASTECTOMY W/ SENTINEL NODE BIOPSY Right 02/24/2021   Procedure: RIGHT MASTECTOMY WITH RIGHT AXILLARY SENTINEL LYMPH  NODE BIOPSY;  Surgeon: [name], MD;  Location: [place];  Service: General;  Laterality: Right;   PORTACATH PLACEMENT Left 10/13/2020   Procedure: INSERTION PORT-A-CATH;  Surgeon: [name], MD;  Location: [place];  Service: General;  Laterality: Left;  LEAVE PORT ACCESSED CHEMO STARTS ON 2/10, LEFT SIDE    Social History:  reports that she has never smoked. She has never used smokeless tobacco. She reports that she does not drink alcohol and does not use drugs.   No Known Allergies  Family History  Problem Relation Age of Onset   Breast cancer Maternal Grandmother 78   Prostate cancer Maternal Grandfather        dx 17s, metastatic    Family history reviewed and not pertinent    Prior to Admission medications   Medication Sig Start Date End Date Taking? Authorizing Provider  albuterol (VENTOLIN HFA) 108 (90 Base) MCG/ACT inhaler Inhale 2 puffs into the lungs every 6 (six) hours as needed for wheezing or shortness of breath. 05/26/21   Gardenia Phlegm, NP  lidocaine-prilocaine (EMLA) cream Apply 1 application topically as needed (port access). 05/26/21   Gardenia Phlegm, NP  LORazepam (ATIVAN) 0.5 MG tablet Take one tablet before coming to cancer center for chemo treatment Patient taking differently: Take 0.5 mg by mouth See admin instructions. Take one tablet before coming to cancer center for chemo treatment  11/25/20   Magrinat, [name], MD  methocarbamol (ROBAXIN) 750 MG tablet Take 1 tablet (750 mg total) by mouth every 8 (eight) hours as needed (use for muscle cramps/pain). 02/25/21   [name], MD  traMADol (ULTRAM) 50 MG tablet Take 1 tablet (50 mg total) by mouth every 6 (six) hours as needed. Patient taking differently: Take 50 mg by mouth every 6 (six) hours as needed for severe pain. 10/13/20   [name], MD  prochlorperazine (COMPAZINE) 10 MG tablet Take 1 tablet (10 mg total) by mouth every 6 (six) hours as  needed (Nausea or vomiting). 09/30/20 01/13/21  Magrinat, [name], MD     Objective    Physical Exam: Vitals:   04/28/22 0230 04/28/22 0245 04/28/22 0300 04/28/22 0345  BP: 120/84 106/74 98/65 115/72  Pulse:   (!) 107 (!) 112  Resp: '14 14 13 14  '$ Temp:      TempSrc:      SpO2:   100% 100%    General: appears to be stated age; alert, oriented Skin: warm, dry, no rash Head:  AT/[place] Mouth:  Oral mucosa membranes appear dry, normal dentition Neck: supple; trachea midline Heart:  RRR; did not appreciate any M/R/G Lungs: CTAB, did not appreciate any wheezes, rales, or rhonchi Abdomen: + BS; soft, ND, NT Vascular: 2+ pedal pulses b/l; 2+ radial pulses b/l Extremities: no peripheral edema, no muscle wasting Neuro: strength and sensation intact in upper and lower extremities b/l    Labs on Admission: I have personally reviewed following labs and imaging studies  CBC: Recent Labs  Lab 04/27/22 1416 04/27/22 2310 04/28/22 0207  WBC 11.9* 12.7*  --   NEUTROABS 8.0* 7.9*  --   HGB 9.8* 9.5* 7.9*  HCT 33.0* 30.4* 24.8*  MCV 91.4 88.6  --   PLT 348 372  --    Basic Metabolic Panel: Recent Labs  Lab 04/27/22 1416  NA 134*  K 4.2  CL 101  CO2 24  GLUCOSE 102*  BUN 12  CREATININE 0.68  CALCIUM 9.1   GFR: CrCl cannot be calculated (Unknown ideal weight.). Liver Function Tests: Recent Labs  Lab 04/27/22 1416  AST 17  ALT 12  ALKPHOS 61  BILITOT 0.3  PROT 6.9  ALBUMIN 3.2*   Recent Labs  Lab 04/27/22 1416  LIPASE 28   No results for input(s): "AMMONIA" in the last 168 hours. Coagulation Profile: Recent Labs  Lab 04/27/22 2310  INR 1.2   Cardiac Enzymes: No results for input(s): "CKTOTAL", "CKMB", "CKMBINDEX", "TROPONINI" in the last 168 hours. BNP (last 3 results) No results for input(s): "PROBNP" in the last 8760 hours. HbA1C: No results for input(s): "HGBA1C" in the last 72 hours. CBG: No results for input(s): "GLUCAP" in the last 168  hours. Lipid Profile: No results for input(s): "CHOL", "HDL", "LDLCALC", "TRIG", "CHOLHDL", "LDLDIRECT" in the last 72 hours. Thyroid Function Tests: No results for input(s): "TSH", "T4TOTAL", "FREET4", "T3FREE", "THYROIDAB" in the last 72 hours. Anemia Panel: No results for input(s): "VITAMINB12", "FOLATE", "FERRITIN", "TIBC", "IRON", "RETICCTPCT" in the last 72 hours. Urine analysis:    Component Value Date/Time   COLORURINE YELLOW 04/28/2022 0121   APPEARANCEUR HAZY (A) 04/28/2022 0121   LABSPEC 1.038 (H) 04/28/2022 0121   PHURINE 5.0 04/28/2022 0121   GLUCOSEU NEGATIVE 04/28/2022 0121   HGBUR SMALL (A) 04/28/2022 0121   BILIRUBINUR NEGATIVE 04/28/2022 0121   KETONESUR 20 (A) 04/28/2022 0121   PROTEINUR NEGATIVE 04/28/2022 0121   UROBILINOGEN 0.2 03/13/2017  [place] 04/28/2022 0121   LEUKOCYTESUR NEGATIVE 04/28/2022 0121    Radiological Exams on Admission: CT Angio Chest PE W and/or Wo Contrast  Result Date: 04/28/2022 CLINICAL DATA:  Nausea and vomiting. Hematemesis. Concern for pulmonary embolism. History of breast cancer. EXAM: CT ANGIOGRAPHY CHEST CT ABDOMEN AND PELVIS WITH CONTRAST TECHNIQUE: Multidetector CT imaging of the chest was performed using the standard protocol during bolus administration of intravenous contrast. Multiplanar CT image reconstructions and MIPs were obtained to evaluate the vascular anatomy. Multidetector CT imaging of the abdomen and pelvis was performed using the standard protocol during bolus administration of intravenous contrast. RADIATION DOSE REDUCTION: This exam was performed according to the departmental dose-optimization program which includes automated exposure control, adjustment of the mA and/or kV according to patient size and/or use of iterative reconstruction technique. CONTRAST:  147m OMNIPAQUE IOHEXOL 350 MG/ML SOLN COMPARISON:  CT of the chest abdomen pelvis dated 06/08/2010. FINDINGS: CTA CHEST FINDINGS Cardiovascular:  There is no cardiomegaly or pericardial effusion. The thoracic aorta is unremarkable. The origins of the great vessels of the aortic arch appear patent as visualized. No pulmonary artery embolus identified. Mediastinum/Nodes: No hilar or mediastinal the lump. Small hiatal hernia. The esophagus is grossly unremarkable. No mediastinal fluid collection. Lungs/Pleura: Probable trace bilateral pleural effusions versus pleural thickening or minimal atelectasis. Bibasilar linear atelectasis. No consolidative changes. No pneumothorax. There is a 3 mm right upper lobe nodule (52/5). Additional small nodules along the minor fissure anteriorly measure up to 3 mm (70/5). The central airways are patent. Musculoskeletal: Postsurgical changes of the right mastectomy with reconstruction. Multiple surgical clips noted. There is small pocket of air in the subcutaneous soft tissues superficial to the pectoralis major. No drainable fluid collection or abscess. No acute osseous pathology. No suspicious bone lesions. Review of the MIP images confirms the above findings. CT ABDOMEN and PELVIS FINDINGS No intra-abdominal free air or free fluid. Hepatobiliary: No focal liver abnormality is seen. No gallstones, gallbladder wall thickening, or biliary dilatation. Pancreas: Unremarkable. No pancreatic ductal dilatation or surrounding inflammatory changes. Spleen: Normal in size without focal abnormality. Adrenals/Urinary Tract: The adrenal glands unremarkable. There is no hydronephrosis on either side. There is symmetric enhancement and excretion of contrast by both kidneys. Multiple small bilateral parapelvic cysts noted. The visualized ureters and urinary bladder appear unremarkable. Stomach/Bowel: Small hiatal hernia. There is no bowel obstruction or active inflammation. There is sigmoid diverticulosis without active inflammatory changes. Moderate stool throughout the colon. The appendix is normal. Vascular/Lymphatic: The abdominal aorta  and IVC unremarkable. No portal venous gas. There is no adenopathy. Reproductive: The uterus is anteverted. Multiple small uterine fibroids. No adnexal masses. Other: Postsurgical changes with stranding of the subcutaneous soft tissues of the anterior abdominal wall. A drainage catheter noted in the subcutaneous soft tissues of the anterior abdominal wall. No drainable fluid collection. Musculoskeletal: No acute or significant osseous findings. Review of the MIP images confirms the above findings. IMPRESSION: 1. No acute intrathoracic pathology. No pulmonary artery embolus identified. 2. Postsurgical changes of the right mastectomy with reconstruction. No drainable fluid collection or abscess. 3. Sigmoid diverticulosis. No bowel obstruction. Normal appendix. Electronically Signed   By: AAnner CreteM.D.   On: 04/28/2022 00:43   CT ABDOMEN PELVIS W CONTRAST  Result Date: 04/28/2022 CLINICAL DATA:  Nausea and vomiting. Hematemesis. Concern for pulmonary embolism. History of breast cancer. EXAM: CT ANGIOGRAPHY CHEST CT ABDOMEN AND PELVIS WITH CONTRAST TECHNIQUE: Multidetector CT imaging of the chest was performed using  the standard protocol during bolus administration of intravenous contrast. Multiplanar CT image reconstructions and MIPs were obtained to evaluate the vascular anatomy. Multidetector CT imaging of the abdomen and pelvis was performed using the standard protocol during bolus administration of intravenous contrast. RADIATION DOSE REDUCTION: This exam was performed according to the departmental dose-optimization program which includes automated exposure control, adjustment of the mA and/or kV according to patient size and/or use of iterative reconstruction technique. CONTRAST:  134m OMNIPAQUE IOHEXOL 350 MG/ML SOLN COMPARISON:  CT of the chest abdomen pelvis dated 06/08/2010. FINDINGS: CTA CHEST FINDINGS Cardiovascular: There is no cardiomegaly or pericardial effusion. The thoracic aorta is  unremarkable. The origins of the great vessels of the aortic arch appear patent as visualized. No pulmonary artery embolus identified. Mediastinum/Nodes: No hilar or mediastinal the lump. Small hiatal hernia. The esophagus is grossly unremarkable. No mediastinal fluid collection. Lungs/Pleura: Probable trace bilateral pleural effusions versus pleural thickening or minimal atelectasis. Bibasilar linear atelectasis. No consolidative changes. No pneumothorax. There is a 3 mm right upper lobe nodule (52/5). Additional small nodules along the minor fissure anteriorly measure up to 3 mm (70/5). The central airways are patent. Musculoskeletal: Postsurgical changes of the right mastectomy with reconstruction. Multiple surgical clips noted. There is small pocket of air in the subcutaneous soft tissues superficial to the pectoralis major. No drainable fluid collection or abscess. No acute osseous pathology. No suspicious bone lesions. Review of the MIP images confirms the above findings. CT ABDOMEN and PELVIS FINDINGS No intra-abdominal free air or free fluid. Hepatobiliary: No focal liver abnormality is seen. No gallstones, gallbladder wall thickening, or biliary dilatation. Pancreas: Unremarkable. No pancreatic ductal dilatation or surrounding inflammatory changes. Spleen: Normal in size without focal abnormality. Adrenals/Urinary Tract: The adrenal glands unremarkable. There is no hydronephrosis on either side. There is symmetric enhancement and excretion of contrast by both kidneys. Multiple small bilateral parapelvic cysts noted. The visualized ureters and urinary bladder appear unremarkable. Stomach/Bowel: Small hiatal hernia. There is no bowel obstruction or active inflammation. There is sigmoid diverticulosis without active inflammatory changes. Moderate stool throughout the colon. The appendix is normal. Vascular/Lymphatic: The abdominal aorta and IVC unremarkable. No portal venous gas. There is no adenopathy.  Reproductive: The uterus is anteverted. Multiple small uterine fibroids. No adnexal masses. Other: Postsurgical changes with stranding of the subcutaneous soft tissues of the anterior abdominal wall. A drainage catheter noted in the subcutaneous soft tissues of the anterior abdominal wall. No drainable fluid collection. Musculoskeletal: No acute or significant osseous findings. Review of the MIP images confirms the above findings. IMPRESSION: 1. No acute intrathoracic pathology. No pulmonary artery embolus identified. 2. Postsurgical changes of the right mastectomy with reconstruction. No drainable fluid collection or abscess. 3. Sigmoid diverticulosis. No bowel obstruction. Normal appendix. Electronically Signed   By: AAnner CreteM.D.   On: 04/28/2022 00:43     Assessment/Plan   Principal Problem:   Acute upper GI bleed Active Problems:   Acute blood loss anemia   Leukocytosis          #) Acute Upper GI Bleed: diagnosis on the basis of 2 episodes of dark-colored coffee-ground emesis over the last 24 hours, with most recent episode occurring around 2300 on 04/27/2022, and a/w evidence of acute blood loss anemia superimposed on anemia of chronic disease, with most recent hemoglobin noted to be 7.9 and associated with hypochromic finding as well as elevated RDW.   She has been on a full dose aspirin over the last 5 days, in  the absence of any additional blood thinners, as further detailed above. Denies NSAID use. No known history of known underlying liver disease, and denies any history of alcohol abuse or recent alcohol consumption. No prior EGD.   In the absence of known liver disease, initiation of SBP prophylaxis does not appear to be warranted. Presentation and history are less suggestive of variceal bleed, and therefore there does not appear to be an indication for octreotide. Given suspected upper GI source, will initiate Protonix drip. At this time, particularly given recent initiation  of low-dose aspirin, differential would appear to favor gastritis versus peptic ulcer disease.   While the patient does not overtly hypotensive, her blood pressure remains soft with systolic blood pressures in the low 100s.  Given a total of 2 episodes of hematemesis, with persistent tachycardia and acute on chronic anemia with potential lag regarding 2 current hemoglobin value, will initiate transfusion of 1 unit PRBC at this time.  Of note, patient is currently asymptomatic. Will also request GI consult.       Plan: NPO. Refraining from pharmacologic DVT prophylaxis. Monitor on telemetry. Monitor continuous pulse-ox. Maintain at least 2 large bore IV's.  Transfuse 1 unit PRBC, with order placed for repeat hemoglobin following completion of this transfusion.  We will also order a every 4 hours H&H's through 9 AM on 04/28/2022. Will request consult from on-call GI.  Further evaluation and management of corresponding acute blood loss anemia, including adding-on iron studies to pre-transfusion specimen, as further detailed below. CMP in the AM. Protonix drip.  Hold home full dose aspirin.        #) Acute blood loss anemia superimposed on anemia of chronic disease: in the setting of suspected acute upper GI bleed, Hgb now noted to be 7.9, associated with hypochromic findings as well as elevated RDW, relative history of anemia of chronic disease with baseline hemoglobin range 9-11.  As described above, will initiate transfusion of 1 unit PRBC at this time.  INR noted to be 1.2.   Plan: work-up and management for presenting suspected acute upper GI bleed, as above.  Transfusing 1 unit PRBC with repeat hemoglobin to recheck following completion of transfusion of this 1 unit.  Serial H&H's, as above. Monitor on telemetry. NPO. Refraining from pharmacologic DVT prophylaxis.  Add on the following to initial lab specimen collected in the ED today: total iron, TIBC, ferritin, reticulocyte count. GI consult, as  above.  Hold home aspirin.          #) Leukocytosis: Mildly elevated white blood cell count of 11,900, stable relative to will blood cell count of 11,800 on postop day 1 following right breast reconstruction surgery, suggestive of reactive leukocytosis approximately 2 the surgical procedure.  Clinically, presentation does not appear to be consistent with underlying infectious process.  No specific underlying infection has been identified, including urinalysis which was inconsistent with UTI, while CTA chest as well as CT abdomen/pelvis showed no evidence of acute infectious process.  There may also be an element of elevation of white blood cell count on the basis of hemoconcentration given presenting intravascular depletion as a result of acute upper GI bleed.  Plan: Repeat CBC with differential in the morning.  Further evaluation and management of suspected acute upper GI bleed, as above.  Monitor strict I's and O's and daily weights.        DVT prophylaxis: SCD's   Code Status: Full code Family Communication: I discussed the patient's case with her husband, who  is present at bedside Disposition Plan: Per Rounding Team Consults called: none;  Admission status: Observation; PCU    PLEASE NOTE THAT DRAGON DICTATION SOFTWARE WAS USED IN THE CONSTRUCTION OF THIS NOTE.   [place] DO [facility]  From [place]   04/28/2022, 4:25 AM

## 2022-04-28 NOTE — Progress Notes (Signed)
Patient seen and examined personally, I reviewed the chart, history and physical and admission note, done by admitting physician this morning and agree with the same with following addendum.  Please refer to the morning admission note for more detailed plan of care.  Briefly,  28 F with history of right-sided breast cancer status posttreatment right mastectomy in June 2022 followed by recent breast reconstruction procedure chronic anemia who was discharged few days ago on aspirin to prevent DVT postoperatively presents with nausea, dark-colored emesis coffee-ground appearance epigastric discomfort.  Patient was in the ED was hemodynamically stable, underwent CT chest no acute PE noted postsurgical changes of the right mastectomy with reconstruction without evidence of drainable fluid collection or abscess CT abdomen pelvis sigmoid diverticulosis no evidence of diverticulitis or any acute pathology. Patient was placed on Protonix drip GI consulted and admitted.   Seen and examined this morning.  Resting comfortably denies current nausea vomiting Husband at the bedside. Abdomen drains in place and draining well  issues Acute upper GI bleeding with hematemesis x2 Acute blood loss anemia due to #1 along with anemia of chronic disease Sp 1 unit, trend H/H. Continue Protonix drip, and n.p.o., IV fluids awaiting GI evaluation for endoscopy.  Holding home aspirin '325mg'$ . Recent Labs  Lab 04/27/22 1416 04/27/22 2310 04/28/22 0207 04/28/22 0432  HGB 9.8* 9.5* 7.9* 7.8*  HCT 33.0* 30.4* 24.8* 24.6*    Leukocytosis: With recent surgery, unclear etiology.  Monitor closely for any signs of infection. Recent breast reconstruction surgery 8/18-she has drains in place abdominal site with glue in place nontender on exam.Patient is being monitored by her surgeon on outpatient basis.   Mild hyponatremia 133-monitor. On aldactone at home- resume as able.

## 2022-04-28 NOTE — Hospital Course (Addendum)
67 F with history of right-sided breast cancer status posttreatment right mastectomy in June 2022 followed by recent breast reconstruction procedure chronic anemia who was discharged few days ago on aspirin to prevent DVT postoperatively presents with nausea, dark-colored emesis coffee-ground appearance epigastric discomfort.  Patient was in the ED was hemodynamically stable, underwent CT chest no acute PE noted postsurgical changes of the right mastectomy with reconstruction without evidence of drainable fluid collection or abscess CT abdomen pelvis sigmoid diverticulosis no evidence of diverticulitis or any acute pathology. Patient was placed on Protonix drip GI consulted and admitted.Underwent EGD-that showed:-LA Grade B reflux esophagitis with no bleeding.Small hiatal hernia.Erosive gastropathy with no bleeding and no stigmata of recent bleeding. Biopsied, Non-bleeding duodenal ulcer with a nonbleeding, visible vessel (Forrest Class IIa).Treated with bipolar cautery.Non-bleeding duodenal ulcer with a clean ulcer base (Forrest Class III). Patient placed on diet advised to stay off aspirin NSAIDs, to continue antireflux measures.

## 2022-04-28 NOTE — Anesthesia Preprocedure Evaluation (Addendum)
Anesthesia Evaluation  Patient identified by MRN, date of birth, ID band Patient awake    Reviewed: Allergy & Precautions, NPO status , Patient's Chart, lab work & pertinent test results  History of Anesthesia Complications Negative for: history of anesthetic complications  Airway Mallampati: II  TM Distance: >3 FB Neck ROM: Full    Dental no notable dental hx.    Pulmonary neg pulmonary ROS,    Pulmonary exam normal        Cardiovascular negative cardio ROS Normal cardiovascular exam     Neuro/Psych negative neurological ROS  negative psych ROS   GI/Hepatic Neg liver ROS, Upper GI Bleed   Endo/Other  negative endocrine ROS  Renal/GU negative Renal ROS  negative genitourinary   Musculoskeletal negative musculoskeletal ROS (+)   Abdominal   Peds  Hematology  (+) Blood dyscrasia (Hgb 7.8), anemia ,   Anesthesia Other Findings Hx of right breast ca  Reproductive/Obstetrics negative OB ROS                            Anesthesia Physical Anesthesia Plan  ASA: 3  Anesthesia Plan: MAC   Post-op Pain Management: Minimal or no pain anticipated   Induction:   PONV Risk Score and Plan: 2 and Treatment may vary due to age or medical condition and Propofol infusion  Airway Management Planned: Natural Airway and Nasal Cannula  Additional Equipment: None  Intra-op Plan:   Post-operative Plan:   Informed Consent: I have reviewed the patients History and Physical, chart, labs and discussed the procedure including the risks, benefits and alternatives for the proposed anesthesia with the patient or authorized representative who has indicated his/her understanding and acceptance.       Plan Discussed with: CRNA  Anesthesia Plan Comments:        Anesthesia Quick Evaluation

## 2022-04-28 NOTE — Consult Note (Addendum)
Consultation  Referring Provider: Dr. Antonieta Pert   Primary Care Physician:  [name], [place] Primary Gastroenterologist:   Althia Forts      Reason for Consultation:  UGI bleed            HPI:   Penny Brown is a 51 y.o. female with a past medical history significant for right-sided breast cancer status posttreatment and right mastectomy in June 2022, followed by recent right breast reconstruction procedure, chronic anemia, who was admitted to the hospital on 04/27/2022 with upper GI bleed.    Recent history includes a right breast reconstruction procedure in [place] on Friday, 04/21/2022, upon discharge she was prescribed Aspirin 325 mg p.o. daily for thromboembolic prophylactic purposes in the postoperative setting.  She had been taking this daily.    At time of admission patient reported developing nausea and vomiting with an episode of dark-colored emesis on 8/24 associated coffee-ground appearance.  Associated symptoms include some sharp epigastric discomfort.    Today, patient is seen with her husband by her side who does assist with history, she explains that she normally does not take much medicine at all but was started on high-dose Aspirin after her recent breast reconstruction surgery and had been taking that daily over the past 5 to 6 days.  Yesterday she started with some generalized abdominal pain with nausea and had an episode of vomiting of dark-colored emesis that looked coffee-ground in appearance.  She then had another episode after arriving to the ER yesterday evening but has had none since.  Tells me they have been giving her meds for nausea which are working well.  No further abdominal discomfort either.      Past medical history of chronic anemia with baseline hemoglobin range 9-11.  Most recently 04/22/2022 hemoglobin 8.9.  Patient last ate on Wednesday, 04/26/2022.  All she had was some coffee to drink yesterday.    Denies fever, chills, weight loss, change in bowel  habits, history of heartburn or reflux or previous GI bleeds.  ED course: Heart rate 103-122, initially hemoglobin 9.8--> 9.5--> 7.9, CTA chest with no evidence of acute pulmonary embolism, CT abdomen pelvis with contrast showed sigmoid diverticulosis but no evidence of diverticulitis and showed no evidence of acute intra-abdominal or acute intrapelvic process  GI history: None  Past Medical History:  Diagnosis Date   Breast cancer ([place]) 2008   Right Breast Cancer   Breast cancer ([place]) 09/2020   Right Breast   Cancer ([place])    Family history of breast cancer    Family history of prostate cancer    Personal history of chemotherapy 2008   Right Breast Cancer   Personal history of radiation therapy 2008   Right Breast Cancer   Port-A-Cath in place 10/21/2020    Past Surgical History:  Procedure Laterality Date   BREAST LUMPECTOMY Right 2008   BREAST REDUCTION SURGERY Left 06/03/2021   Procedure: MAMMARY REDUCTION  (BREAST);  Surgeon: Irene Limbo, MD;  Location: [facility];  Service: Plastics;  Laterality: Left;   CESAREAN SECTION     IR CV LINE INJECTION  08/25/2021   MASTECTOMY W/ SENTINEL NODE BIOPSY Right 02/24/2021   Procedure: RIGHT MASTECTOMY WITH RIGHT AXILLARY SENTINEL LYMPH NODE BIOPSY;  Surgeon: [name], MD;  Location: [facility];  Service: General;  Laterality: Right;   PORTACATH PLACEMENT Left 10/13/2020   Procedure: INSERTION PORT-A-CATH;  Surgeon: [name], MD;  Location: [facility];  Service: General;  Laterality: Left;  LEAVE PORT ACCESSED CHEMO STARTS ON 2/10, LEFT SIDE    Family History  Problem Relation Age of Onset   Breast cancer Maternal Grandmother 78   Prostate cancer Maternal Grandfather        dx 43s, metastatic    Social History   Tobacco Use   Smoking status: Never   Smokeless tobacco: Never  Vaping Use   Vaping Use: Never used  Substance Use Topics   Alcohol use: No   Drug use: No    Prior  to Admission medications   Medication Sig Start Date End Date Taking? Authorizing Provider  albuterol (VENTOLIN HFA) 108 (90 Base) MCG/ACT inhaler Inhale 2 puffs into the lungs every 6 (six) hours as needed for wheezing or shortness of breath. 05/26/21   Gardenia Phlegm, NP  lidocaine-prilocaine (EMLA) cream Apply 1 application topically as needed (port access). 05/26/21   Gardenia Phlegm, NP  LORazepam (ATIVAN) 0.5 MG tablet Take one tablet before coming to cancer center for chemo treatment Patient taking differently: Take 0.5 mg by mouth See admin instructions. Take one tablet before coming to cancer center for chemo treatment 11/25/20   Magrinat, [name], MD  methocarbamol (ROBAXIN) 750 MG tablet Take 1 tablet (750 mg total) by mouth every 8 (eight) hours as needed (use for muscle cramps/pain). 02/25/21   [name], MD  traMADol (ULTRAM) 50 MG tablet Take 1 tablet (50 mg total) by mouth every 6 (six) hours as needed. Patient taking differently: Take 50 mg by mouth every 6 (six) hours as needed for severe pain. 10/13/20   [name], MD  prochlorperazine (COMPAZINE) 10 MG tablet Take 1 tablet (10 mg total) by mouth every 6 (six) hours as needed (Nausea or vomiting). 09/30/20 01/13/21  Magrinat, [name], MD    Current Facility-Administered Medications  Medication Dose Route Frequency Provider Last Rate Last Admin   acetaminophen (TYLENOL) tablet 650 mg  650 mg Oral Q6H PRN Howerter, Justin B, DO       Or   acetaminophen (TYLENOL) suppository 650 mg  650 mg Rectal Q6H PRN Howerter, Justin B, DO       ondansetron (ZOFRAN) injection 4 mg  4 mg Intravenous Q6H PRN Howerter, Justin B, DO   4 mg at 04/28/22 0445   [START ON 05/01/2022] pantoprazole (PROTONIX) injection 40 mg  40 mg Intravenous Q12H Howerter, Justin B, DO       pantoprozole (PROTONIX) 80 mg /NS 100 mL infusion  8 mg/hr Intravenous Continuous Howerter, Justin B, DO 10 mL/hr at 04/28/22 0525 8 mg/hr at 04/28/22  1610   Current Outpatient Medications  Medication Sig Dispense Refill   albuterol (VENTOLIN HFA) 108 (90 Base) MCG/ACT inhaler Inhale 2 puffs into the lungs every 6 (six) hours as needed for wheezing or shortness of breath. 8 g 2   lidocaine-prilocaine (EMLA) cream Apply 1 application topically as needed (port access). 30 g 1   LORazepam (ATIVAN) 0.5 MG tablet Take one tablet before coming to cancer center for chemo treatment (Patient taking differently: Take 0.5 mg by mouth See admin instructions. Take one tablet before coming to cancer center for chemo treatment) 30 tablet 0   methocarbamol (ROBAXIN) 750 MG tablet Take 1 tablet (750 mg total) by mouth every 8 (eight) hours as needed (use for muscle cramps/pain). 30 tablet 2   traMADol (ULTRAM) 50 MG tablet Take 1 tablet (50 mg total) by mouth every 6 (six) hours as needed. (Patient taking differently: Take  50 mg by mouth every 6 (six) hours as needed for severe pain.) 8 tablet 0    Allergies as of 04/27/2022   (No Known Allergies)     Review of Systems:    Constitutional: No weight loss, fever or chills Skin: No rash Cardiovascular: No chest pain Respiratory: No SOB  Gastrointestinal: See HPI and otherwise negative Genitourinary: No dysuria  Neurological: No headache, dizziness or syncope Musculoskeletal: No new muscle or joint pain Hematologic: No bruising Psychiatric: No history of depression or anxiety    Physical Exam:  Vital signs in last 24 hours: Temp:  [96.8 F (36 C)-97.9 F (36.6 C)] 97.4 F (36.3 C) (08/25 0901) Pulse Rate:  [100-124] 110 (08/25 0901) Resp:  [13-23] 17 (08/25 0901) BP: (98-138)/(65-88) 117/73 (08/25 0901) SpO2:  [97 %-100 %] 100 % (08/25 0901)   General:   Pleasant AA female appears to be in NAD, Well developed, Well nourished, alert and cooperative Head:  Normocephalic and atraumatic. Eyes:   PEERL, EOMI. No icterus. Conjunctiva pink. Ears:  Normal auditory acuity. Neck:  Supple Throat: Oral  cavity and pharynx without inflammation, swelling or lesion.  Lungs: Respirations even and unlabored. Lungs clear to auscultation bilaterally.   No wheezes, crackles, or rhonchi.  Heart: Normal S1, S2. No MRG. Regular rate and rhythm. No peripheral edema, cyanosis or pallor.  Abdomen:  Soft, nondistended, nontender. No rebound or guarding. Normal bowel sounds. No appreciable masses or hepatomegaly. Rectal:  Not performed.  Msk:  Symmetrical without gross deformities. Peripheral pulses intact.  Extremities:  Without edema, no deformity or joint abnormality.  Neurologic:  Alert and  oriented x4;  grossly normal neurologically.  Skin:   Dry and intact without significant lesions or rashes. Psychiatric: Demonstrates good judgement and reason without abnormal affect or behaviors.   LAB RESULTS: Recent Labs    04/27/22 1416 04/27/22 2310 04/28/22 0207 04/28/22 0432  WBC 11.9* 12.7*  --  12.7*  HGB 9.8* 9.5* 7.9* 7.8*  HCT 33.0* 30.4* 24.8* 24.6*  PLT 348 372  --  308   BMET Recent Labs    04/27/22 1416 04/28/22 0432  NA 134* 133*  K 4.2 4.0  CL 101 101  CO2 24 24  GLUCOSE 102* 107*  BUN 12 17  CREATININE 0.68 0.63  CALCIUM 9.1 8.4*   LFT Recent Labs    04/28/22 0432  PROT 5.8*  ALBUMIN 2.8*  AST 13*  ALT 12  ALKPHOS 54  BILITOT 0.4   PT/INR Recent Labs    04/27/22 2310  LABPROT 14.6  INR 1.2    STUDIES: CT Angio Chest PE W and/or Wo Contrast  Result Date: 04/28/2022 CLINICAL DATA:  Nausea and vomiting. Hematemesis. Concern for pulmonary embolism. History of breast cancer. EXAM: CT ANGIOGRAPHY CHEST CT ABDOMEN AND PELVIS WITH CONTRAST TECHNIQUE: Multidetector CT imaging of the chest was performed using the standard protocol during bolus administration of intravenous contrast. Multiplanar CT image reconstructions and MIPs were obtained to evaluate the vascular anatomy. Multidetector CT imaging of the abdomen and pelvis was performed using the standard protocol  during bolus administration of intravenous contrast. RADIATION DOSE REDUCTION: This exam was performed according to the departmental dose-optimization program which includes automated exposure control, adjustment of the mA and/or kV according to patient size and/or use of iterative reconstruction technique. CONTRAST:  129m OMNIPAQUE IOHEXOL 350 MG/ML SOLN COMPARISON:  CT of the chest abdomen pelvis dated 06/08/2010. FINDINGS: CTA CHEST FINDINGS Cardiovascular: There is no cardiomegaly or pericardial effusion. The  thoracic aorta is unremarkable. The origins of the great vessels of the aortic arch appear patent as visualized. No pulmonary artery embolus identified. Mediastinum/Nodes: No hilar or mediastinal the lump. Small hiatal hernia. The esophagus is grossly unremarkable. No mediastinal fluid collection. Lungs/Pleura: Probable trace bilateral pleural effusions versus pleural thickening or minimal atelectasis. Bibasilar linear atelectasis. No consolidative changes. No pneumothorax. There is a 3 mm right upper lobe nodule (52/5). Additional small nodules along the minor fissure anteriorly measure up to 3 mm (70/5). The central airways are patent. Musculoskeletal: Postsurgical changes of the right mastectomy with reconstruction. Multiple surgical clips noted. There is small pocket of air in the subcutaneous soft tissues superficial to the pectoralis major. No drainable fluid collection or abscess. No acute osseous pathology. No suspicious bone lesions. Review of the MIP images confirms the above findings. CT ABDOMEN and PELVIS FINDINGS No intra-abdominal free air or free fluid. Hepatobiliary: No focal liver abnormality is seen. No gallstones, gallbladder wall thickening, or biliary dilatation. Pancreas: Unremarkable. No pancreatic ductal dilatation or surrounding inflammatory changes. Spleen: Normal in size without focal abnormality. Adrenals/Urinary Tract: The adrenal glands unremarkable. There is no hydronephrosis  on either side. There is symmetric enhancement and excretion of contrast by both kidneys. Multiple small bilateral parapelvic cysts noted. The visualized ureters and urinary bladder appear unremarkable. Stomach/Bowel: Small hiatal hernia. There is no bowel obstruction or active inflammation. There is sigmoid diverticulosis without active inflammatory changes. Moderate stool throughout the colon. The appendix is normal. Vascular/Lymphatic: The abdominal aorta and IVC unremarkable. No portal venous gas. There is no adenopathy. Reproductive: The uterus is anteverted. Multiple small uterine fibroids. No adnexal masses. Other: Postsurgical changes with stranding of the subcutaneous soft tissues of the anterior abdominal wall. A drainage catheter noted in the subcutaneous soft tissues of the anterior abdominal wall. No drainable fluid collection. Musculoskeletal: No acute or significant osseous findings. Review of the MIP images confirms the above findings. IMPRESSION: 1. No acute intrathoracic pathology. No pulmonary artery embolus identified. 2. Postsurgical changes of the right mastectomy with reconstruction. No drainable fluid collection or abscess. 3. Sigmoid diverticulosis. No bowel obstruction. Normal appendix. Electronically Signed   By: Anner Crete M.D.   On: 04/28/2022 00:43   CT ABDOMEN PELVIS W CONTRAST  Result Date: 04/28/2022 CLINICAL DATA:  Nausea and vomiting. Hematemesis. Concern for pulmonary embolism. History of breast cancer. EXAM: CT ANGIOGRAPHY CHEST CT ABDOMEN AND PELVIS WITH CONTRAST TECHNIQUE: Multidetector CT imaging of the chest was performed using the standard protocol during bolus administration of intravenous contrast. Multiplanar CT image reconstructions and MIPs were obtained to evaluate the vascular anatomy. Multidetector CT imaging of the abdomen and pelvis was performed using the standard protocol during bolus administration of intravenous contrast. RADIATION DOSE REDUCTION:  This exam was performed according to the departmental dose-optimization program which includes automated exposure control, adjustment of the mA and/or kV according to patient size and/or use of iterative reconstruction technique. CONTRAST:  185m OMNIPAQUE IOHEXOL 350 MG/ML SOLN COMPARISON:  CT of the chest abdomen pelvis dated 06/08/2010. FINDINGS: CTA CHEST FINDINGS Cardiovascular: There is no cardiomegaly or pericardial effusion. The thoracic aorta is unremarkable. The origins of the great vessels of the aortic arch appear patent as visualized. No pulmonary artery embolus identified. Mediastinum/Nodes: No hilar or mediastinal the lump. Small hiatal hernia. The esophagus is grossly unremarkable. No mediastinal fluid collection. Lungs/Pleura: Probable trace bilateral pleural effusions versus pleural thickening or minimal atelectasis. Bibasilar linear atelectasis. No consolidative changes. No pneumothorax. There is a 3 mm  right upper lobe nodule (52/5). Additional small nodules along the minor fissure anteriorly measure up to 3 mm (70/5). The central airways are patent. Musculoskeletal: Postsurgical changes of the right mastectomy with reconstruction. Multiple surgical clips noted. There is small pocket of air in the subcutaneous soft tissues superficial to the pectoralis major. No drainable fluid collection or abscess. No acute osseous pathology. No suspicious bone lesions. Review of the MIP images confirms the above findings. CT ABDOMEN and PELVIS FINDINGS No intra-abdominal free air or free fluid. Hepatobiliary: No focal liver abnormality is seen. No gallstones, gallbladder wall thickening, or biliary dilatation. Pancreas: Unremarkable. No pancreatic ductal dilatation or surrounding inflammatory changes. Spleen: Normal in size without focal abnormality. Adrenals/Urinary Tract: The adrenal glands unremarkable. There is no hydronephrosis on either side. There is symmetric enhancement and excretion of contrast by  both kidneys. Multiple small bilateral parapelvic cysts noted. The visualized ureters and urinary bladder appear unremarkable. Stomach/Bowel: Small hiatal hernia. There is no bowel obstruction or active inflammation. There is sigmoid diverticulosis without active inflammatory changes. Moderate stool throughout the colon. The appendix is normal. Vascular/Lymphatic: The abdominal aorta and IVC unremarkable. No portal venous gas. There is no adenopathy. Reproductive: The uterus is anteverted. Multiple small uterine fibroids. No adnexal masses. Other: Postsurgical changes with stranding of the subcutaneous soft tissues of the anterior abdominal wall. A drainage catheter noted in the subcutaneous soft tissues of the anterior abdominal wall. No drainable fluid collection. Musculoskeletal: No acute or significant osseous findings. Review of the MIP images confirms the above findings. IMPRESSION: 1. No acute intrathoracic pathology. No pulmonary artery embolus identified. 2. Postsurgical changes of the right mastectomy with reconstruction. No drainable fluid collection or abscess. 3. Sigmoid diverticulosis. No bowel obstruction. Normal appendix. Electronically Signed   By: Anner Crete M.D.   On: 04/28/2022 00:43      Impression / Plan:   Impression: 1.  Acute upper GI bleed: 2 episodes of dark coffee-ground emesis over the past 24 hours since starting high-dose aspirin after recent breast construction 8/18, no prior history of heartburn reflux, some associated abdominal pain; likely upper GI bleed related to duodenal ulcer from NSAIDs versus gastritis or other given history of chemo 2.  Acute on chronic anemia: Related to above 3.  Recent breast construction 8/18  Plan: 1.  Plan for EGD today with Dr. Fuller Plan.  Did discuss risks, benefits, limitations and alternatives and patient agrees to proceed. 2.  Patient will be n.p.o. until after time of procedure. 3.  Agree with IV PPI twice daily 4.  Continue to  monitor hemoglobin and transfusion as needed less than 7 5.  Please await further recommendations after time of EGD today.  Thank you for your kind consultation, we will continue to follow.  Lavone Nian [facility]  04/28/2022, 9:03 AM    Attending Physician Note   I have taken a history, reviewed the chart and examined the patient. I performed a substantive portion of this encounter, including complete performance of at least one of the key components, in conjunction with the APP. I agree with the APP's note, impression and recommendations with my edits. My additional impressions and recommendations are as follows.   *Acute UGI bleed with abdominal pain, hematemesis. R/O ulcer, MW tear, other source. IV Pantoprazole 40 mg bid. Avoid ASA/NSAIDs. EGD today.  *ABL anemia. Trend CBC. Transfusions for Hgb < 7.   *S/P right breast reconstruction on 8/18 following right mastectomy in June for breast cancer  *Screening colonoscopy electively as outpatient.  Lucio Edward, MD [facility] See [facility], [facility], for our on call provider

## 2022-04-28 NOTE — Transfer of Care (Signed)
Immediate Anesthesia Transfer of Care Note  Patient: Penny Brown  Procedure(s) Performed: ESOPHAGOGASTRODUODENOSCOPY (EGD) WITH PROPOFOL HOT HEMOSTASIS (ARGON PLASMA COAGULATION/BICAP)  Patient Location: Endoscopy Unit  Anesthesia Type:MAC  Level of Consciousness: awake, alert  and oriented  Airway & Oxygen Therapy: Patient Spontanous Breathing and Patient connected to nasal cannula oxygen  Post-op Assessment: Report given to RN and Post -op Vital signs reviewed and stable  Post vital signs: Reviewed and stable  Last Vitals:  Vitals Value Taken Time  BP 110/69 04/28/22 1321  Temp 36.6 C 04/28/22 1321  Pulse 113 04/28/22 1325  Resp 22 04/28/22 1325  SpO2 100 % 04/28/22 1325  Vitals shown include unvalidated device data.  Last Pain:  Vitals:   04/28/22 1321  TempSrc: Temporal  PainSc: 0-No pain         Complications: No notable events documented.

## 2022-04-28 NOTE — ED Provider Notes (Signed)
Hx breast cancer and and reconstructive surgery  Today had nausea and episode of hematemesis. Second episode this evening. Lighter in color still bloody. Surgeon from [employer] told her to come in for "scans".  Follow up on Ct abd/pelvis  'likely needs admission for tele monitoring , Serial H&H  Physical Exam  BP 108/69   Pulse (!) 116   Temp 97.9 F (36.6 C)   Resp 16   SpO2 99%   Physical Exam  Procedures  .Critical Care  Performed by: Margarita Mail, PA-C Authorized by: Margarita Mail, PA-C   Critical care provider statement:    Critical care time (minutes):  46   Critical care was necessary to treat or prevent imminent or life-threatening deterioration of the following conditions:  Circulatory failure   Critical care was time spent personally by me on the following activities:  Development of treatment plan with patient or surrogate, discussions with consultants, evaluation of patient's response to treatment, examination of patient, ordering and review of laboratory studies, ordering and review of radiographic studies, ordering and performing treatments and interventions, pulse oximetry, re-evaluation of patient's condition and review of old charts   ED Course / MDM   Clinical Course as of 04/28/22 0009  Thu Apr 27, 2022  2250 Episode of vomiting with small amount of blood mixed into vomitus. Ordered Zofran [LA]    Clinical Course User Index [LA] Mickie Hillier, PA-C   Medical Decision Making Amount and/or Complexity of Data Reviewed Labs: ordered. Radiology: ordered.  Risk Prescription drug management. Decision regarding hospitalization.  Patient with hematemesis- she has persistent tachycardia, hypotension and down trending HGb- discussed hospitalist.  Patient will need admission.       Margarita Mail, PA-C 04/29/22 1738    Fatima Blank, MD 04/30/22 1816

## 2022-04-28 NOTE — Op Note (Signed)
[facility] Patient Name: Penny Brown Procedure Date : 04/28/2022 MRN: [id number] Attending MD: Ladene Artist , MD Date of Birth: [birth year] CSN: [id number] Age: 51 Admit Type: Inpatient Procedure:                Upper GI endoscopy Indications:              Hematemesis, UGI bleeding, ABL anemia Providers:                Pricilla Riffle. Fuller Plan, MD, Dulcy Fanny, Cletis Athens, Technician Referring MD:             [facility] Medicines:                Monitored Anesthesia Care Complications:            No immediate complications. Estimated Blood Loss:     Estimated blood loss: none. Procedure:                Pre-Anesthesia Assessment:                           - Prior to the procedure, a History and Physical                            was performed, and patient medications and                            allergies were reviewed. The patient's tolerance of                            previous anesthesia was also reviewed. The risks                            and benefits of the procedure and the sedation                            options and risks were discussed with the patient.                            All questions were answered, and informed consent                            was obtained. Prior Anticoagulants: The patient has                            taken no previous anticoagulant or antiplatelet                            agents. ASA Grade Assessment: II - A patient with                            mild systemic disease. After reviewing the risks  and benefits, the patient was deemed in                            satisfactory condition to undergo the procedure.                           After obtaining informed consent, the endoscope was                            passed under direct vision. Throughout the                            procedure, the patient's blood pressure, pulse, and                            oxygen  saturations were monitored continuously. The                            [id number]-H190 ([id number]) Olympus endoscope was introduced                            through the mouth, and advanced to the second part                            of duodenum. The upper GI endoscopy was                            accomplished without difficulty. The patient                            tolerated the procedure well. Scope In: Scope Out: Findings:      LA Grade B (one or more mucosal breaks greater than 5 mm, not extending       between the tops of two mucosal folds) esophagitis with no bleeding was       found at the gastroesophageal junction.      The exam of the esophagus was otherwise normal.      A small hiatal hernia was present.      Multiple localized 3 to 5 mm erosions with no bleeding and no stigmata       of recent bleeding were found in the gastric antrum. Biopsies were taken       with a cold forceps for histology.      The exam of the stomach was otherwise normal.      One non-bleeding cratered duodenal ulcer with a nonbleeding visible       vessel (Forrest Class IIa) was found in the duodenal bulb. The lesion       was 10 mm in largest dimension. Coagulation for bleeding prevention       using bipolar probe was successful.      One non-bleeding cratered duodenal ulcer with a clean ulcer base       (Forrest Class III) was found in the duodenal bulb. The lesion was 10 mm       in largest dimension.      The exam of the duodenum was otherwise normal. Impression:               -  LA Grade B reflux esophagitis with no bleeding.                           - Small hiatal hernia.                           - Erosive gastropathy with no bleeding and no                            stigmata of recent bleeding. Biopsied.                           - Non-bleeding duodenal ulcer with a nonbleeding                            visible vessel (Forrest Class IIa). Treated with                            bipolar  cautery.                           - Non-bleeding duodenal ulcer with a clean ulcer                            base (Forrest Class III). Recommendation:           - Return patient to hospital ward for ongoing care.                           - Full liquid diet today.                           - Follow antireflux measures long term.                           - Continue present medications.                           - Await pathology results.                           - No aspirin, ibuprofen, naproxen, or other                            non-steroidal anti-inflammatory drugs. Procedure Code(s):        --- Professional ---                           29476, 59, Esophagogastroduodenoscopy, flexible,                            transoral; with control of bleeding, any method                           43239, Esophagogastroduodenoscopy, flexible,  transoral; with biopsy, single or multiple Diagnosis Code(s):        --- Professional ---                           K21.00, Gastro-esophageal reflux disease with                            esophagitis, without bleeding                           K44.9, Diaphragmatic hernia without obstruction or                            gangrene                           K31.89, Other diseases of stomach and duodenum                           K26.4, Chronic or unspecified duodenal ulcer with                            hemorrhage                           K26.9, Duodenal ulcer, unspecified as acute or                            chronic, without hemorrhage or perforation                           K92.0, Hematemesis                           K92.2, Gastrointestinal hemorrhage, unspecified CPT copyright 2019 [employer]. All rights reserved. The codes documented in this report are preliminary and upon coder review may  be revised to meet current compliance requirements. Ladene Artist, MD 04/28/2022 1:25:46 PM This report has  been signed electronically. Number of Addenda: 0

## 2022-04-28 NOTE — ED Notes (Signed)
2nd blood type sample sent to Lab

## 2022-04-28 NOTE — ED Notes (Signed)
ED TO INPATIENT HANDOFF REPORT  ED Nurse Name and Phone #: Vin Yonke RN [phone]  S Name/Age/Gender Penny Brown 51 y.o. female Room/Bed: 019C/019C  Code Status   Code Status: Full Code  Home/SNF/Other Home Patient oriented to: self, place, time, and situation Is this baseline? Yes   Triage Complete: Triage complete  Chief Complaint Acute upper GI bleed [K92.2] Hematemesis, unspecified whether nausea present [K92.0]  Triage Note Pt had D flap procedure last Friday by Dr. Deatra James in [place] and has been recovering without complication; however, approximately 30 mins PTA patient had one episode of hematemesis. Pt denies pain.    Allergies No Known Allergies  Level of Care/Admitting Diagnosis ED Disposition     ED Disposition  Admit   Condition  --   Comment  Hospital Area: [facility] [[id number]]  Level of Care: Progressive [102]  Admit to Progressive based on following criteria: MULTISYSTEM THREATS such as stable sepsis, metabolic/electrolyte imbalance with or without encephalopathy that is responding to early treatment.  Admit to Progressive based on following criteria: GI, ENDOCRINE disease patients with GI bleeding, acute liver failure or pancreatitis, stable with diabetic ketoacidosis or thyrotoxicosis (hypothyroid) state.  May admit patient to [facility] if equivalent level of care is available:: No  Covid Evaluation: Asymptomatic - no recent exposure (last 10 days) testing not required  Diagnosis: Acute upper GI bleed [694854]  Admitting Physician: Rhetta Mura [[id number]]  Attending Physician: Antonieta Pert [[id number]]  Certification:: I certify this patient will need inpatient services for at least 2 midnights  Estimated Length of Stay: 2          B Medical/Surgery History Past Medical History:  Diagnosis Date   Breast cancer ([place]) 2008   Right Breast Cancer   Breast cancer ([facility]) 09/2020   Right Breast   Cancer ([place])     Family history of breast cancer    Family history of prostate cancer    Personal history of chemotherapy 2008   Right Breast Cancer   Personal history of radiation therapy 2008   Right Breast Cancer   Port-A-Cath in place 10/21/2020   Past Surgical History:  Procedure Laterality Date   BREAST LUMPECTOMY Right 2008   BREAST REDUCTION SURGERY Left 06/03/2021   Procedure: MAMMARY REDUCTION  (BREAST);  Surgeon: Irene Limbo, MD;  Location: [facility];  Service: Plastics;  Laterality: Left;   CESAREAN SECTION     IR CV LINE INJECTION  08/25/2021   MASTECTOMY W/ SENTINEL NODE BIOPSY Right 02/24/2021   Procedure: RIGHT MASTECTOMY WITH RIGHT AXILLARY SENTINEL LYMPH NODE BIOPSY;  Surgeon: [name], MD;  Location: [facility];  Service: General;  Laterality: Right;   PORTACATH PLACEMENT Left 10/13/2020   Procedure: INSERTION PORT-A-CATH;  Surgeon: [name], MD;  Location: [facility];  Service: General;  Laterality: Left;  LEAVE PORT ACCESSED CHEMO STARTS ON 2/10, LEFT SIDE     A IV Location/Drains/Wounds Patient Lines/Drains/Airways Status     Active Line/Drains/Airways     Name Placement date Placement time Site Days   Peripheral IV 04/27/22 18 G Left Antecubital 04/27/22  2303  Antecubital  1   Peripheral IV 04/28/22 22 G Left;Proximal Wrist 04/28/22  0508  Wrist  less than 1   Closed System Drain 1 Lateral;Right Abdomen Bulb (JP) 04/28/22  0659  Abdomen  less than 1   Closed System Drain 2 Lateral;Left Abdomen Bulb (JP) 04/28/22  0700  Abdomen  less than  1            Intake/Output Last 24 hours  Intake/Output Summary (Last 24 hours) at 04/28/2022 1659 Last data filed at 04/28/2022 1400 Gross per 24 hour  Intake 1062.02 ml  Output --  Net 1062.02 ml    Labs/Imaging Results for orders placed or performed during the hospital encounter of 04/27/22 (from the past 48 hour(s))  CBC with Differential     Status: Abnormal   Collection  Time: 04/27/22  2:16 PM  Result Value Ref Range   WBC 11.9 (H) 4.0 - 10.5 K/uL   RBC 3.61 (L) 3.87 - 5.11 MIL/uL   Hemoglobin 9.8 (L) 12.0 - 15.0 g/dL   HCT 33.0 (L) 36.0 - 46.0 %   MCV 91.4 80.0 - 100.0 fL   MCH 27.1 26.0 - 34.0 pg   MCHC 29.7 (L) 30.0 - 36.0 g/dL   RDW 15.6 (H) 11.5 - 15.5 %   Platelets 348 150 - 400 K/uL   nRBC 0.0 0.0 - 0.2 %   Neutrophils Relative % 68 %   Neutro Abs 8.0 (H) 1.7 - 7.7 K/uL   Lymphocytes Relative 17 %   Lymphs Abs 2.0 0.7 - 4.0 K/uL   Monocytes Relative 6 %   Monocytes Absolute 0.7 0.1 - 1.0 K/uL   Eosinophils Relative 8 %   Eosinophils Absolute 1.0 (H) 0.0 - 0.5 K/uL   Basophils Relative 0 %   Basophils Absolute 0.0 0.0 - 0.1 K/uL   Immature Granulocytes 1 %   Abs Immature Granulocytes 0.16 (H) 0.00 - 0.07 K/uL    Comment: Performed at [facility], [address] [place], [place] [address]  Comprehensive metabolic panel     Status: Abnormal   Collection Time: 04/27/22  2:16 PM  Result Value Ref Range   Sodium 134 (L) 135 - 145 mmol/L   Potassium 4.2 3.5 - 5.1 mmol/L   Chloride 101 98 - 111 mmol/L   CO2 24 22 - 32 mmol/L   Glucose, Bld 102 (H) 70 - 99 mg/dL    Comment: Glucose reference range applies only to samples taken after fasting for at least 8 hours.   BUN 12 6 - 20 mg/dL   Creatinine, Ser 0.68 0.44 - 1.00 mg/dL   Calcium 9.1 8.9 - 10.3 mg/dL   Total Protein 6.9 6.5 - 8.1 g/dL   Albumin 3.2 (L) 3.5 - 5.0 g/dL   AST 17 15 - 41 U/L   ALT 12 0 - 44 U/L   Alkaline Phosphatase 61 38 - 126 U/L   Total Bilirubin 0.3 0.3 - 1.2 mg/dL   GFR, Estimated >60 >60 mL/min    Comment: (NOTE) Calculated using the CKD-EPI Creatinine Equation (2021)    Anion gap 9 5 - 15    Comment: Performed at [facility] [address] [place], [place] [address]  Lipase, blood     Status: None   Collection Time: 04/27/22  2:16 PM  Result Value Ref Range   Lipase 28 11 - 51 U/L    Comment: Performed at [facility], [place]  [address] [place], [place] [address]  Protime-INR     Status: None   Collection Time: 04/27/22 11:10 PM  Result Value Ref Range   Prothrombin Time 14.6 11.4 - 15.2 seconds   INR 1.2 0.8 - 1.2    Comment: (NOTE) INR goal varies based on device and disease states. Performed at [facility],  [address] [place], [place] [address]   CBC with Differential     Status: Abnormal   Collection Time: 04/27/22 11:10 PM  Result Value Ref Range   WBC 12.7 (H) 4.0 - 10.5 K/uL   RBC 3.43 (L) 3.87 - 5.11 MIL/uL   Hemoglobin 9.5 (L) 12.0 - 15.0 g/dL   HCT 30.4 (L) 36.0 - 46.0 %   MCV 88.6 80.0 - 100.0 fL   MCH 27.7 26.0 - 34.0 pg   MCHC 31.3 30.0 - 36.0 g/dL   RDW 15.7 (H) 11.5 - 15.5 %   Platelets 372 150 - 400 K/uL   nRBC 0.0 0.0 - 0.2 %   Neutrophils Relative % 63 %   Neutro Abs 7.9 (H) 1.7 - 7.7 K/uL   Lymphocytes Relative 21 %   Lymphs Abs 2.7 0.7 - 4.0 K/uL   Monocytes Relative 6 %   Monocytes Absolute 0.8 0.1 - 1.0 K/uL   Eosinophils Relative 8 %   Eosinophils Absolute 1.0 (H) 0.0 - 0.5 K/uL   Basophils Relative 0 %   Basophils Absolute 0.0 0.0 - 0.1 K/uL   Immature Granulocytes 2 %   Abs Immature Granulocytes 0.29 (H) 0.00 - 0.07 K/uL    Comment: Performed at [facility] [address] [place], [place] [address]  Type and screen [place]     Status: None (Preliminary result)   Collection Time: 04/27/22 11:12 PM  Result Value Ref Range   ABO/RH(D) A POS    Antibody Screen NEG    Sample Expiration 04/30/2022,2359    Unit Number [id number]    Blood Component Type RED CELLS,LR    Unit division 00    Status of Unit ISSUED    Transfusion Status OK TO TRANSFUSE    Crossmatch Result      Compatible Performed at [facility], [place] [address] [place], [place] [address]   Urinalysis, Routine w reflex microscopic Urine, Clean Catch     Status: Abnormal   Collection Time: 04/28/22  1:21 AM  Result Value Ref Range   Color, Urine YELLOW YELLOW    APPearance HAZY (A) CLEAR   Specific Gravity, Urine 1.038 (H) 1.005 - 1.030   pH 5.0 5.0 - 8.0   Glucose, UA NEGATIVE NEGATIVE mg/dL   Hgb urine dipstick SMALL (A) NEGATIVE   Bilirubin Urine NEGATIVE NEGATIVE   Ketones, ur 20 (A) NEGATIVE mg/dL   Protein, ur NEGATIVE NEGATIVE mg/dL   Nitrite NEGATIVE NEGATIVE   Leukocytes,Ua NEGATIVE NEGATIVE   RBC / HPF 0-5 0 - 5 RBC/hpf   WBC, UA 0-5 0 - 5 WBC/hpf   Bacteria, UA NONE SEEN NONE SEEN   Squamous Epithelial / LPF 0-5 0 - 5   Mucus PRESENT     Comment: Performed at [facility], [place] [address] [place], [place] [address]  Hemoglobin and hematocrit, blood     Status: Abnormal   Collection Time: 04/28/22  2:07 AM  Result Value Ref Range   Hemoglobin 7.9 (L) 12.0 - 15.0 g/dL   HCT 24.8 (L) 36.0 - 46.0 %    Comment: Performed at [facility], [place] [address], [place], [place] [address]  Prepare RBC (crossmatch)     Status: None   Collection Time: 04/28/22  4:24 AM  Result Value Ref Range   Order Confirmation      ORDER PROCESSED BY BLOOD BANK Performed at [facility], [place]  [address], [place], [place] [address]   CBC with Differential/Platelet     Status: Abnormal   Collection Time: 04/28/22  4:32 AM  Result Value Ref Range   WBC 12.7 (H) 4.0 - 10.5 K/uL   RBC 2.80 (L) 3.87 - 5.11 MIL/uL   Hemoglobin 7.8 (L) 12.0 - 15.0 g/dL   HCT 24.6 (L) 36.0 - 46.0 %   MCV 87.9 80.0 - 100.0 fL   MCH 27.9 26.0 - 34.0 pg   MCHC 31.7 30.0 - 36.0 g/dL   RDW 15.8 (H) 11.5 - 15.5 %   Platelets 308 150 - 400 K/uL   nRBC 0.0 0.0 - 0.2 %   Neutrophils Relative % 67 %   Neutro Abs 8.5 (H) 1.7 - 7.7 K/uL   Lymphocytes Relative 17 %   Lymphs Abs 2.2 0.7 - 4.0 K/uL   Monocytes Relative 8 %   Monocytes Absolute 1.0 0.1 - 1.0 K/uL   Eosinophils Relative 6 %   Eosinophils Absolute 0.8 (H) 0.0 - 0.5 K/uL   Basophils Relative 0 %   Basophils Absolute 0.0 0.0 - 0.1 K/uL   Immature Granulocytes 2 %   Abs Immature Granulocytes 0.24 (H)  0.00 - 0.07 K/uL    Comment: Performed at [facility], [place] [address], [place], [place] [address]  Comprehensive metabolic panel     Status: Abnormal   Collection Time: 04/28/22  4:32 AM  Result Value Ref Range   Sodium 133 (L) 135 - 145 mmol/L   Potassium 4.0 3.5 - 5.1 mmol/L   Chloride 101 98 - 111 mmol/L   CO2 24 22 - 32 mmol/L   Glucose, Bld 107 (H) 70 - 99 mg/dL    Comment: Glucose reference range applies only to samples taken after fasting for at least 8 hours.   BUN 17 6 - 20 mg/dL   Creatinine, Ser 0.63 0.44 - 1.00 mg/dL   Calcium 8.4 (L) 8.9 - 10.3 mg/dL   Total Protein 5.8 (L) 6.5 - 8.1 g/dL   Albumin 2.8 (L) 3.5 - 5.0 g/dL   AST 13 (L) 15 - 41 U/L   ALT 12 0 - 44 U/L   Alkaline Phosphatase 54 38 - 126 U/L   Total Bilirubin 0.4 0.3 - 1.2 mg/dL   GFR, Estimated >60 >60 mL/min    Comment: (NOTE) Calculated using the CKD-EPI Creatinine Equation (2021)    Anion gap 8 5 - 15    Comment: Performed at [facility], [place] [address] [place], [place] [address]  Magnesium     Status: None   Collection Time: 04/28/22  4:32 AM  Result Value Ref Range   Magnesium 1.7 1.7 - 2.4 mg/dL    Comment: Performed at [facility] [address] [place], [place] [address]  Ferritin     Status: None   Collection Time: 04/28/22  4:32 AM  Result Value Ref Range   Ferritin 41 11 - 307 ng/mL    Comment: Performed at [facility], [place] [address], [place], [place] [address]  Iron and TIBC     Status: None   Collection Time: 04/28/22  4:32 AM  Result Value Ref Range   Iron 37 28 - 170 ug/dL   TIBC 277 250 - 450 ug/dL   Saturation Ratios 13 10.4 - 31.8 %   UIBC 240 ug/dL    Comment: Performed at [facility], [place] [address] [place], [place] [address]  Reticulocytes  Status: Abnormal   Collection Time: 04/28/22  4:32 AM  Result Value Ref Range   Retic Ct Pct 2.3 0.4 - 3.1 %   RBC. 2.82 (L) 3.87 - 5.11 MIL/uL   Retic Count, Absolute 66.0 19.0 - 186.0 K/uL    Immature Retic Fract 39.5 (H) 2.3 - 15.9 %    Comment: Performed at [facility] [address], [place], [place] [address]  ABO/Rh     Status: None   Collection Time: 04/28/22  4:45 AM  Result Value Ref Range   ABO/RH(D)      A POS Performed at [facility] [address], [name], [place] [address]   Hemoglobin and hematocrit, blood     Status: Abnormal   Collection Time: 04/28/22 11:30 AM  Result Value Ref Range   Hemoglobin 9.0 (L) 12.0 - 15.0 g/dL   HCT 27.8 (L) 36.0 - 46.0 %    Comment: Performed at [facility], [place] [address] [place], [place] [address]   CT Angio Chest PE W and/or Wo Contrast  Result Date: 04/28/2022 CLINICAL DATA:  Nausea and vomiting. Hematemesis. Concern for pulmonary embolism. History of breast cancer. EXAM: CT ANGIOGRAPHY CHEST CT ABDOMEN AND PELVIS WITH CONTRAST TECHNIQUE: Multidetector CT imaging of the chest was performed using the standard protocol during bolus administration of intravenous contrast. Multiplanar CT image reconstructions and MIPs were obtained to evaluate the vascular anatomy. Multidetector CT imaging of the abdomen and pelvis was performed using the standard protocol during bolus administration of intravenous contrast. RADIATION DOSE REDUCTION: This exam was performed according to the departmental dose-optimization program which includes automated exposure control, adjustment of the mA and/or kV according to patient size and/or use of iterative reconstruction technique. CONTRAST:  125m OMNIPAQUE IOHEXOL 350 MG/ML SOLN COMPARISON:  CT of the chest abdomen pelvis dated 06/08/2010. FINDINGS: CTA CHEST FINDINGS Cardiovascular: There is no cardiomegaly or pericardial effusion. The thoracic aorta is unremarkable. The origins of the great vessels of the aortic arch appear patent as visualized. No pulmonary artery embolus identified. Mediastinum/Nodes: No hilar or mediastinal the lump. Small hiatal hernia. The esophagus is grossly  unremarkable. No mediastinal fluid collection. Lungs/Pleura: Probable trace bilateral pleural effusions versus pleural thickening or minimal atelectasis. Bibasilar linear atelectasis. No consolidative changes. No pneumothorax. There is a 3 mm right upper lobe nodule (52/5). Additional small nodules along the minor fissure anteriorly measure up to 3 mm (70/5). The central airways are patent. Musculoskeletal: Postsurgical changes of the right mastectomy with reconstruction. Multiple surgical clips noted. There is small pocket of air in the subcutaneous soft tissues superficial to the pectoralis major. No drainable fluid collection or abscess. No acute osseous pathology. No suspicious bone lesions. Review of the MIP images confirms the above findings. CT ABDOMEN and PELVIS FINDINGS No intra-abdominal free air or free fluid. Hepatobiliary: No focal liver abnormality is seen. No gallstones, gallbladder wall thickening, or biliary dilatation. Pancreas: Unremarkable. No pancreatic ductal dilatation or surrounding inflammatory changes. Spleen: Normal in size without focal abnormality. Adrenals/Urinary Tract: The adrenal glands unremarkable. There is no hydronephrosis on either side. There is symmetric enhancement and excretion of contrast by both kidneys. Multiple small bilateral parapelvic cysts noted. The visualized ureters and urinary bladder appear unremarkable. Stomach/Bowel: Small hiatal hernia. There is no bowel obstruction or active inflammation. There is sigmoid diverticulosis without active inflammatory changes. Moderate stool throughout the colon. The appendix is normal. Vascular/Lymphatic: The abdominal aorta and IVC unremarkable. No portal venous gas. There is  no adenopathy. Reproductive: The uterus is anteverted. Multiple small uterine fibroids. No adnexal masses. Other: Postsurgical changes with stranding of the subcutaneous soft tissues of the anterior abdominal wall. A drainage catheter noted in the  subcutaneous soft tissues of the anterior abdominal wall. No drainable fluid collection. Musculoskeletal: No acute or significant osseous findings. Review of the MIP images confirms the above findings. IMPRESSION: 1. No acute intrathoracic pathology. No pulmonary artery embolus identified. 2. Postsurgical changes of the right mastectomy with reconstruction. No drainable fluid collection or abscess. 3. Sigmoid diverticulosis. No bowel obstruction. Normal appendix. Electronically Signed   By: Anner Crete M.D.   On: 04/28/2022 00:43   CT ABDOMEN PELVIS W CONTRAST  Result Date: 04/28/2022 CLINICAL DATA:  Nausea and vomiting. Hematemesis. Concern for pulmonary embolism. History of breast cancer. EXAM: CT ANGIOGRAPHY CHEST CT ABDOMEN AND PELVIS WITH CONTRAST TECHNIQUE: Multidetector CT imaging of the chest was performed using the standard protocol during bolus administration of intravenous contrast. Multiplanar CT image reconstructions and MIPs were obtained to evaluate the vascular anatomy. Multidetector CT imaging of the abdomen and pelvis was performed using the standard protocol during bolus administration of intravenous contrast. RADIATION DOSE REDUCTION: This exam was performed according to the departmental dose-optimization program which includes automated exposure control, adjustment of the mA and/or kV according to patient size and/or use of iterative reconstruction technique. CONTRAST:  112m OMNIPAQUE IOHEXOL 350 MG/ML SOLN COMPARISON:  CT of the chest abdomen pelvis dated 06/08/2010. FINDINGS: CTA CHEST FINDINGS Cardiovascular: There is no cardiomegaly or pericardial effusion. The thoracic aorta is unremarkable. The origins of the great vessels of the aortic arch appear patent as visualized. No pulmonary artery embolus identified. Mediastinum/Nodes: No hilar or mediastinal the lump. Small hiatal hernia. The esophagus is grossly unremarkable. No mediastinal fluid collection. Lungs/Pleura: Probable  trace bilateral pleural effusions versus pleural thickening or minimal atelectasis. Bibasilar linear atelectasis. No consolidative changes. No pneumothorax. There is a 3 mm right upper lobe nodule (52/5). Additional small nodules along the minor fissure anteriorly measure up to 3 mm (70/5). The central airways are patent. Musculoskeletal: Postsurgical changes of the right mastectomy with reconstruction. Multiple surgical clips noted. There is small pocket of air in the subcutaneous soft tissues superficial to the pectoralis major. No drainable fluid collection or abscess. No acute osseous pathology. No suspicious bone lesions. Review of the MIP images confirms the above findings. CT ABDOMEN and PELVIS FINDINGS No intra-abdominal free air or free fluid. Hepatobiliary: No focal liver abnormality is seen. No gallstones, gallbladder wall thickening, or biliary dilatation. Pancreas: Unremarkable. No pancreatic ductal dilatation or surrounding inflammatory changes. Spleen: Normal in size without focal abnormality. Adrenals/Urinary Tract: The adrenal glands unremarkable. There is no hydronephrosis on either side. There is symmetric enhancement and excretion of contrast by both kidneys. Multiple small bilateral parapelvic cysts noted. The visualized ureters and urinary bladder appear unremarkable. Stomach/Bowel: Small hiatal hernia. There is no bowel obstruction or active inflammation. There is sigmoid diverticulosis without active inflammatory changes. Moderate stool throughout the colon. The appendix is normal. Vascular/Lymphatic: The abdominal aorta and IVC unremarkable. No portal venous gas. There is no adenopathy. Reproductive: The uterus is anteverted. Multiple small uterine fibroids. No adnexal masses. Other: Postsurgical changes with stranding of the subcutaneous soft tissues of the anterior abdominal wall. A drainage catheter noted in the subcutaneous soft tissues of the anterior abdominal wall. No drainable fluid  collection. Musculoskeletal: No acute or significant osseous findings. Review of the MIP images confirms the above findings. IMPRESSION: 1. No  acute intrathoracic pathology. No pulmonary artery embolus identified. 2. Postsurgical changes of the right mastectomy with reconstruction. No drainable fluid collection or abscess. 3. Sigmoid diverticulosis. No bowel obstruction. Normal appendix. Electronically Signed   By: Anner Crete M.D.   On: 04/28/2022 00:43    Pending Labs Unresulted Labs (From admission, onward)     Start     Ordered   04/29/22 1540  Basic metabolic panel  Daily at 5am,   R      04/28/22 1117   04/29/22 0500  CBC  Daily at 5am,   R      04/28/22 1117   04/28/22 2000  Hemoglobin and hematocrit, blood  Once-Timed,   TIMED        04/28/22 1117            Vitals/Pain Today's Vitals   04/28/22 1340 04/28/22 1350 04/28/22 1403 04/28/22 1408  BP: 132/77 139/83 133/75   Pulse: (!) 104 (!) 105 (!) 103   Resp: 20 (!) 24 15   Temp:    98.2 F (36.8 C)  TempSrc:    Oral  SpO2: 100% 100% 100%   Weight:      Height:      PainSc: 0-No pain 0-No pain      Isolation Precautions No active isolations  Medications Medications  acetaminophen (TYLENOL) tablet 650 mg ( Oral MAR Unhold 04/28/22 1358)    Or  acetaminophen (TYLENOL) suppository 650 mg ( Rectal MAR Unhold 04/28/22 1358)  ondansetron (ZOFRAN) injection 4 mg ( Intravenous MAR Unhold 04/28/22 1358)  pantoprozole (PROTONIX) 80 mg /NS 100 mL infusion (8 mg/hr Intravenous New Bag/Given 04/28/22 0525)  pantoprazole (PROTONIX) injection 40 mg ( Intravenous MAR Unhold 04/28/22 1358)  gabapentin (NEURONTIN) capsule 100 mg ( Oral MAR Unhold 04/28/22 1358)  linaclotide (LINZESS) capsule 72 mcg ( Oral MAR Unhold 04/28/22 1358)  oxyCODONE (Oxy IR/ROXICODONE) immediate release tablet 5 mg ( Oral MAR Unhold 04/28/22 1358)  ondansetron (ZOFRAN) injection 4 mg (4 mg Intravenous Given 04/27/22 2303)  lactated ringers bolus 1,000 mL (0  mLs Intravenous Stopped 04/28/22 0017)  iohexol (OMNIPAQUE) 350 MG/ML injection 100 mL (100 mLs Intravenous Contrast Given 04/28/22 0022)  pantoprazole (PROTONIX) 80 mg /NS 100 mL IVPB (0 mg Intravenous Stopped 04/28/22 0525)  0.9 %  sodium chloride infusion (Manually program via Guardrails IV Fluids) (0 mLs Intravenous Stopped 04/28/22 0858)    Mobility walks with person assist Moderate fall risk    R Recommendations: See Admitting Provider Note  Report given to: Cloretta Ned RN  Additional Notes:

## 2022-04-28 NOTE — ED Notes (Signed)
ED Provider at bedside to discuss disposition

## 2022-04-29 DIAGNOSIS — K922 Gastrointestinal hemorrhage, unspecified: Secondary | ICD-10-CM | POA: Diagnosis not present

## 2022-04-29 LAB — HEMOGLOBIN AND HEMATOCRIT, BLOOD
HCT: 26.7 % — ABNORMAL LOW (ref 36.0–46.0)
HCT: 27.6 % — ABNORMAL LOW (ref 36.0–46.0)
Hemoglobin: 8.8 g/dL — ABNORMAL LOW (ref 12.0–15.0)
Hemoglobin: 8.8 g/dL — ABNORMAL LOW (ref 12.0–15.0)

## 2022-04-29 LAB — BASIC METABOLIC PANEL
Anion gap: 8 (ref 5–15)
BUN: 11 mg/dL (ref 6–20)
CO2: 24 mmol/L (ref 22–32)
Calcium: 8.5 mg/dL — ABNORMAL LOW (ref 8.9–10.3)
Chloride: 103 mmol/L (ref 98–111)
Creatinine, Ser: 0.7 mg/dL (ref 0.44–1.00)
GFR, Estimated: >60 mL/min (ref >60)
Glucose, Bld: 93 mg/dL (ref 70–99)
Potassium: 4 mmol/L (ref 3.5–5.1)
Sodium: 135 mmol/L (ref 135–145)

## 2022-04-29 LAB — TYPE AND SCREEN
ABO/RH(D): A POS
Antibody Screen: NEGATIVE
Unit division: 0

## 2022-04-29 LAB — CBC
HCT: 23.8 % — ABNORMAL LOW (ref 36.0–46.0)
Hemoglobin: 7.9 g/dL — ABNORMAL LOW (ref 12.0–15.0)
MCH: 28.4 pg (ref 26.0–34.0)
MCHC: 33.2 g/dL (ref 30.0–36.0)
MCV: 85.6 fL (ref 80.0–100.0)
Platelets: 288 10*3/uL (ref 150–400)
RBC: 2.78 MIL/uL — ABNORMAL LOW (ref 3.87–5.11)
RDW: 15.9 % — ABNORMAL HIGH (ref 11.5–15.5)
WBC: 11.4 10*3/uL — ABNORMAL HIGH (ref 4.0–10.5)
nRBC: 0 % (ref 0.0–0.2)

## 2022-04-29 LAB — BPAM RBC
Blood Product Expiration Date: 202309012359
ISSUE DATE / TIME: 202308250612
Unit Type and Rh: 6200

## 2022-04-29 LAB — GLUCOSE, CAPILLARY: Glucose-Capillary: 115 mg/dL — ABNORMAL HIGH (ref 70–99)

## 2022-04-29 NOTE — Progress Notes (Signed)
PROGRESS NOTE FOR [facility]  Subjective: Feeling well.  No reports of hematemesis, hematochezia, or melena.  Objective: Vital signs in last 24 hours: Temp:  [97.4 F (36.3 C)-98.3 F (36.8 C)] 98 F (36.7 C) (08/26 0425) Pulse Rate:  [91-118] 109 (08/26 0425) Resp:  [15-24] 16 (08/26 0425) BP: (104-139)/(60-83) 104/66 (08/26 0425) SpO2:  [98 %-100 %] 98 % (08/26 0425) Weight:  [73.9 kg-75.8 kg] 73.9 kg (08/26 0425) Last BM Date : 04/28/22  Intake/Output from previous day: 08/25 0701 - 08/26 0700 In: 862.1 [I.V.:547.1; Blood:315] Out: 70 [Drains:70] Intake/Output this shift: No intake/output data recorded.  General appearance: alert and no distress GI: soft, non-tender; bowel sounds normal; no masses,  no organomegaly  Lab Results: Recent Labs    04/27/22 2310 04/28/22 0207 04/28/22 0432 04/28/22 1130 04/28/22 1958 04/29/22 0413  WBC 12.7*  --  12.7*  --   --  11.4*  HGB 9.5*   < > 7.8* 9.0* 8.8* 7.9*  HCT 30.4*   < > 24.6* 27.8* 26.3* 23.8*  PLT 372  --  308  --   --  288   < > = values in this interval not displayed.   BMET Recent Labs    04/27/22 1416 04/28/22 0432 04/29/22 0413  NA 134* 133* 135  K 4.2 4.0 4.0  CL 101 101 103  CO2 '24 24 24  '$ GLUCOSE 102* 107* 93  BUN '12 17 11  '$ CREATININE 0.68 0.63 0.70  CALCIUM 9.1 8.4* 8.5*   LFT Recent Labs    04/28/22 0432  PROT 5.8*  ALBUMIN 2.8*  AST 13*  ALT 12  ALKPHOS 54  BILITOT 0.4   PT/INR Recent Labs    04/27/22 2310  LABPROT 14.6  INR 1.2   Hepatitis Panel No results for input(s): "HEPBSAG", "HCVAB", "HEPAIGM", "HEPBIGM" in the last 72 hours. C-Diff No results for input(s): "CDIFFTOX" in the last 72 hours. Fecal Lactopherrin No results for input(s): "FECLLACTOFRN" in the last 72 hours.  Studies/Results: CT Angio Chest PE W and/or Wo Contrast  Result Date: 04/28/2022 CLINICAL DATA:  Nausea and vomiting. Hematemesis. Concern for pulmonary embolism. History of breast cancer. EXAM: CT  ANGIOGRAPHY CHEST CT ABDOMEN AND PELVIS WITH CONTRAST TECHNIQUE: Multidetector CT imaging of the chest was performed using the standard protocol during bolus administration of intravenous contrast. Multiplanar CT image reconstructions and MIPs were obtained to evaluate the vascular anatomy. Multidetector CT imaging of the abdomen and pelvis was performed using the standard protocol during bolus administration of intravenous contrast. RADIATION DOSE REDUCTION: This exam was performed according to the departmental dose-optimization program which includes automated exposure control, adjustment of the mA and/or kV according to patient size and/or use of iterative reconstruction technique. CONTRAST:  182m OMNIPAQUE IOHEXOL 350 MG/ML SOLN COMPARISON:  CT of the chest abdomen pelvis dated 06/08/2010. FINDINGS: CTA CHEST FINDINGS Cardiovascular: There is no cardiomegaly or pericardial effusion. The thoracic aorta is unremarkable. The origins of the great vessels of the aortic arch appear patent as visualized. No pulmonary artery embolus identified. Mediastinum/Nodes: No hilar or mediastinal the lump. Small hiatal hernia. The esophagus is grossly unremarkable. No mediastinal fluid collection. Lungs/Pleura: Probable trace bilateral pleural effusions versus pleural thickening or minimal atelectasis. Bibasilar linear atelectasis. No consolidative changes. No pneumothorax. There is a 3 mm right upper lobe nodule (52/5). Additional small nodules along the minor fissure anteriorly measure up to 3 mm (70/5). The central airways are patent. Musculoskeletal: Postsurgical changes of the right mastectomy with reconstruction. Multiple  surgical clips noted. There is small pocket of air in the subcutaneous soft tissues superficial to the pectoralis major. No drainable fluid collection or abscess. No acute osseous pathology. No suspicious bone lesions. Review of the MIP images confirms the above findings. CT ABDOMEN and PELVIS FINDINGS No  intra-abdominal free air or free fluid. Hepatobiliary: No focal liver abnormality is seen. No gallstones, gallbladder wall thickening, or biliary dilatation. Pancreas: Unremarkable. No pancreatic ductal dilatation or surrounding inflammatory changes. Spleen: Normal in size without focal abnormality. Adrenals/Urinary Tract: The adrenal glands unremarkable. There is no hydronephrosis on either side. There is symmetric enhancement and excretion of contrast by both kidneys. Multiple small bilateral parapelvic cysts noted. The visualized ureters and urinary bladder appear unremarkable. Stomach/Bowel: Small hiatal hernia. There is no bowel obstruction or active inflammation. There is sigmoid diverticulosis without active inflammatory changes. Moderate stool throughout the colon. The appendix is normal. Vascular/Lymphatic: The abdominal aorta and IVC unremarkable. No portal venous gas. There is no adenopathy. Reproductive: The uterus is anteverted. Multiple small uterine fibroids. No adnexal masses. Other: Postsurgical changes with stranding of the subcutaneous soft tissues of the anterior abdominal wall. A drainage catheter noted in the subcutaneous soft tissues of the anterior abdominal wall. No drainable fluid collection. Musculoskeletal: No acute or significant osseous findings. Review of the MIP images confirms the above findings. IMPRESSION: 1. No acute intrathoracic pathology. No pulmonary artery embolus identified. 2. Postsurgical changes of the right mastectomy with reconstruction. No drainable fluid collection or abscess. 3. Sigmoid diverticulosis. No bowel obstruction. Normal appendix. Electronically Signed   By: Anner Crete M.D.   On: 04/28/2022 00:43   CT ABDOMEN PELVIS W CONTRAST  Result Date: 04/28/2022 CLINICAL DATA:  Nausea and vomiting. Hematemesis. Concern for pulmonary embolism. History of breast cancer. EXAM: CT ANGIOGRAPHY CHEST CT ABDOMEN AND PELVIS WITH CONTRAST TECHNIQUE: Multidetector CT  imaging of the chest was performed using the standard protocol during bolus administration of intravenous contrast. Multiplanar CT image reconstructions and MIPs were obtained to evaluate the vascular anatomy. Multidetector CT imaging of the abdomen and pelvis was performed using the standard protocol during bolus administration of intravenous contrast. RADIATION DOSE REDUCTION: This exam was performed according to the departmental dose-optimization program which includes automated exposure control, adjustment of the mA and/or kV according to patient size and/or use of iterative reconstruction technique. CONTRAST:  113m OMNIPAQUE IOHEXOL 350 MG/ML SOLN COMPARISON:  CT of the chest abdomen pelvis dated 06/08/2010. FINDINGS: CTA CHEST FINDINGS Cardiovascular: There is no cardiomegaly or pericardial effusion. The thoracic aorta is unremarkable. The origins of the great vessels of the aortic arch appear patent as visualized. No pulmonary artery embolus identified. Mediastinum/Nodes: No hilar or mediastinal the lump. Small hiatal hernia. The esophagus is grossly unremarkable. No mediastinal fluid collection. Lungs/Pleura: Probable trace bilateral pleural effusions versus pleural thickening or minimal atelectasis. Bibasilar linear atelectasis. No consolidative changes. No pneumothorax. There is a 3 mm right upper lobe nodule (52/5). Additional small nodules along the minor fissure anteriorly measure up to 3 mm (70/5). The central airways are patent. Musculoskeletal: Postsurgical changes of the right mastectomy with reconstruction. Multiple surgical clips noted. There is small pocket of air in the subcutaneous soft tissues superficial to the pectoralis major. No drainable fluid collection or abscess. No acute osseous pathology. No suspicious bone lesions. Review of the MIP images confirms the above findings. CT ABDOMEN and PELVIS FINDINGS No intra-abdominal free air or free fluid. Hepatobiliary: No focal liver abnormality  is seen. No gallstones, gallbladder wall thickening,  or biliary dilatation. Pancreas: Unremarkable. No pancreatic ductal dilatation or surrounding inflammatory changes. Spleen: Normal in size without focal abnormality. Adrenals/Urinary Tract: The adrenal glands unremarkable. There is no hydronephrosis on either side. There is symmetric enhancement and excretion of contrast by both kidneys. Multiple small bilateral parapelvic cysts noted. The visualized ureters and urinary bladder appear unremarkable. Stomach/Bowel: Small hiatal hernia. There is no bowel obstruction or active inflammation. There is sigmoid diverticulosis without active inflammatory changes. Moderate stool throughout the colon. The appendix is normal. Vascular/Lymphatic: The abdominal aorta and IVC unremarkable. No portal venous gas. There is no adenopathy. Reproductive: The uterus is anteverted. Multiple small uterine fibroids. No adnexal masses. Other: Postsurgical changes with stranding of the subcutaneous soft tissues of the anterior abdominal wall. A drainage catheter noted in the subcutaneous soft tissues of the anterior abdominal wall. No drainable fluid collection. Musculoskeletal: No acute or significant osseous findings. Review of the MIP images confirms the above findings. IMPRESSION: 1. No acute intrathoracic pathology. No pulmonary artery embolus identified. 2. Postsurgical changes of the right mastectomy with reconstruction. No drainable fluid collection or abscess. 3. Sigmoid diverticulosis. No bowel obstruction. Normal appendix. Electronically Signed   By: Anner Crete M.D.   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 2) Anemia.   The HGB did drop down, but there was no obvious bleeding.  It is felt to  be equilibration.  She feels well.  Plan: 1) Advance to a regular diet. 2) Follow HGB and transfuse if needed. 3) Maintain pantoprazole.  LOS: 1 day   Penny Brown D 04/29/2022, 7:38 AM

## 2022-04-29 NOTE — Progress Notes (Signed)
PROGRESS NOTE Penny Brown  YBO:[id number] DOB: 12-Jan-1971 DOA: 04/27/2022 PCP: [name], FNP   Brief Narrative/Hospital Course: 67 F with history of right-sided breast cancer status posttreatment right mastectomy in June 2022 followed by recent breast reconstruction procedure chronic anemia who was discharged few days ago on aspirin to prevent DVT postoperatively presents with nausea, dark-colored emesis coffee-ground appearance epigastric discomfort.  Patient was in the ED was hemodynamically stable, underwent CT chest no acute PE noted postsurgical changes of the right mastectomy with reconstruction without evidence of drainable fluid collection or abscess CT abdomen pelvis sigmoid diverticulosis no evidence of diverticulitis or any acute pathology. Patient was placed on Protonix drip GI consulted and admitted.Underwent EGD-that showed:-LA Grade B reflux esophagitis with no bleeding.Small hiatal hernia.Erosive gastropathy with no bleeding and no stigmata of recent bleeding. Biopsied, Non-bleeding duodenal ulcer with a nonbleeding, visible vessel (Forrest Class IIa).Treated with bipolar cautery.Non-bleeding duodenal ulcer with a clean ulcer base (Forrest Class III). Patient placed on diet advised to stay off aspirin NSAIDs, to continue antireflux measures.    Subjective: Seen examined Had BM regular H/h dropped in 7.9 gm  Assessment and Plan: Principal Problem:   Acute upper GI bleed Active Problems:   Acute blood loss anemia   Leukocytosis   Hematemesis   Duodenal ulcer   Erosive gastritis   Acute upper GI bleeding with hematemesis x2 Acute blood loss anemia due to #1 along with anemia of chronic disease: S/P egd- LA Grade B reflux esophagitis with no bleeding.Small hiatal hernia.Erosive gastropathy with no bleeding and no stigmata of recent bleeding. Biopsied, Non-bleeding duodenal ulcer with a nonbleeding, visible vessel (Forrest Class IIa).Treated with bipolar  cautery.Non-bleeding duodenal ulcer with a clean ulcer base (Forrest Class III).  Continue PPI.  Advancing diet, noted some drop in hemoglobin we will check serial H&H, GI following closely and appreciate input.  Some of the drop could be due to patient's serosanguinuous abdominal drain. She had 1 unit transfused 8/25. Recent Labs  Lab 04/28/22 0207 04/28/22 0432 04/28/22 1130 04/28/22 1958 04/29/22 0413  HGB 7.9* 7.8* 9.0* 8.8* 7.9*  HCT 24.8* 24.6* 27.8* 26.3* 23.8*   Leukocytosis: With recent surgery, unclear etiology.  Monitor closely for any signs of infection.  Resolving. Recent breast reconstruction surgery 8/18-she has drains in place abdominal site with glue in place nontender on exam.Patient is being monitored by her surgeon on outpatient basis.  Has serosanguineous drainage. Mild hyponatremia resolved.   Overweight with Body mass index is 29.79 kg/m. : Will benefit with PCP follow-up, weight loss  healthy lifestyle and outpatient sleep evaluation.  DVT prophylaxis: SCDs Start: 04/28/22 2778 Code Status:   Code Status: Full Code Family Communication: plan of care discussed with patient/husband at bedside. Patient status is: Inpatient because of monitoring of hemoglobin due to GI bleeding with emesis Level of care: Progressive   Dispo: The patient is from: Home            Anticipated disposition: Home in 24 hrs if hemoglobin stable and tolerating diet  Mobility Assessment (last 72 hours)     Mobility Assessment   No documentation.            Objective: Vitals last 24 hrs: Vitals:   04/28/22 2041 04/28/22 2121 04/29/22 0425 04/29/22 0836  BP: 112/80 123/79 104/66 103/67  Pulse: (!) 118 94 (!) 109 78  Resp: '18  16 18  '$ Temp: 98.3 F (36.8 C) 98.2 F (36.8 C) 98 F (36.7 C) 98.1 F (36.7 C)  TempSrc: Oral Oral Oral Oral  SpO2: 99% 98% 98%   Weight:   73.9 kg   Height:       Weight change:   Physical Examination: General exam: alert awake,older than stated  age, weak appearing. HEENT:Oral mucosa moist, Ear/Nose WNL grossly, dentition normal. Respiratory system: bilaterally clear BS, no use of accessory muscle Cardiovascular system: S1 & S2 +, No JVD. Gastrointestinal system: Abdomen soft,drain in place w/ serosanguineous output, minimally tender,ND, BS+ Nervous System:Alert, awake, moving extremities and grossly nonfocal Extremities: LE edema neg,distal peripheral pulses palpable.  Skin: No rashes,no icterus. MSK: Normal muscle bulk,tone, power  Medications reviewed:  Scheduled Meds:  gabapentin  100 mg Oral TID   [START ON 05/01/2022] pantoprazole  40 mg Intravenous Q12H   Continuous Infusions:  pantoprazole 8 mg/hr (04/29/22 0558)      Diet Order             Diet regular Room service appropriate? Yes; Fluid consistency: Thin  Diet effective now                   Intake/Output Summary (Last 24 hours) at 04/29/2022 0949 Last data filed at 04/29/2022 0448 Gross per 24 hour  Intake 547.12 ml  Output 70 ml  Net 477.12 ml   Net IO Since Admission: 1,139.14 mL [04/29/22 0949]  Wt Readings from Last 3 Encounters:  04/29/22 73.9 kg  11/11/21 76.9 kg  10/20/21 75.4 kg     Unresulted Labs (From admission, onward)     Start     Ordered   04/30/22 0500  CBC  Daily at 5am,   R      04/29/22 0910   04/29/22 1200  Hemoglobin and hematocrit, blood  Now then every 8 hours,   R      04/29/22 0910   04/29/22 4081  Basic metabolic panel  Daily at 5am,   R      04/28/22 1117   04/29/22 0500  CBC  Daily at 5am,   R      04/28/22 1117          Data Reviewed: I have personally reviewed following labs and imaging studies CBC: Recent Labs  Lab 04/27/22 1416 04/27/22 2310 04/28/22 0207 04/28/22 0432 04/28/22 1130 04/28/22 1958 04/29/22 0413  WBC 11.9* 12.7*  --  12.7*  --   --  11.4*  NEUTROABS 8.0* 7.9*  --  8.5*  --   --   --   HGB 9.8* 9.5* 7.9* 7.8* 9.0* 8.8* 7.9*  HCT 33.0* 30.4* 24.8* 24.6* 27.8* 26.3* 23.8*  MCV  91.4 88.6  --  87.9  --   --  85.6  PLT 348 372  --  308  --   --  448   Basic Metabolic Panel: Recent Labs  Lab 04/27/22 1416 04/28/22 0432 04/29/22 0413  NA 134* 133* 135  K 4.2 4.0 4.0  CL 101 101 103  CO2 '24 24 24  '$ GLUCOSE 102* 107* 93  BUN '12 17 11  '$ CREATININE 0.68 0.63 0.70  CALCIUM 9.1 8.4* 8.5*  MG  --  1.7  --    GFR: Estimated Creatinine Clearance: 78.3 mL/min (by C-G formula based on SCr of 0.7 mg/dL). Liver Function Tests: Recent Labs  Lab 04/27/22 1416 04/28/22 0432  AST 17 13*  ALT 12 12  ALKPHOS 61 54  BILITOT 0.3 0.4  PROT 6.9 5.8*  ALBUMIN 3.2* 2.8*   Recent Labs  Lab 04/27/22 1416  LIPASE  28   No results for input(s): "AMMONIA" in the last 168 hours. Coagulation Profile: Recent Labs  Lab 04/27/22 2310  INR 1.2  other culture-see note  Radiology Studies: CT Angio Chest PE W and/or Wo Contrast  Result Date: 04/28/2022 CLINICAL DATA:  Nausea and vomiting. Hematemesis. Concern for pulmonary embolism. History of breast cancer. EXAM: CT ANGIOGRAPHY CHEST CT ABDOMEN AND PELVIS WITH CONTRAST TECHNIQUE: Multidetector CT imaging of the chest was performed using the standard protocol during bolus administration of intravenous contrast. Multiplanar CT image reconstructions and MIPs were obtained to evaluate the vascular anatomy. Multidetector CT imaging of the abdomen and pelvis was performed using the standard protocol during bolus administration of intravenous contrast. RADIATION DOSE REDUCTION: This exam was performed according to the departmental dose-optimization program which includes automated exposure control, adjustment of the mA and/or kV according to patient size and/or use of iterative reconstruction technique. CONTRAST:  125m OMNIPAQUE IOHEXOL 350 MG/ML SOLN COMPARISON:  CT of the chest abdomen pelvis dated 06/08/2010. FINDINGS: CTA CHEST FINDINGS Cardiovascular: There is no cardiomegaly or pericardial effusion. The thoracic aorta is unremarkable.  The origins of the great vessels of the aortic arch appear patent as visualized. No pulmonary artery embolus identified. Mediastinum/Nodes: No hilar or mediastinal the lump. Small hiatal hernia. The esophagus is grossly unremarkable. No mediastinal fluid collection. Lungs/Pleura: Probable trace bilateral pleural effusions versus pleural thickening or minimal atelectasis. Bibasilar linear atelectasis. No consolidative changes. No pneumothorax. There is a 3 mm right upper lobe nodule (52/5). Additional small nodules along the minor fissure anteriorly measure up to 3 mm (70/5). The central airways are patent. Musculoskeletal: Postsurgical changes of the right mastectomy with reconstruction. Multiple surgical clips noted. There is small pocket of air in the subcutaneous soft tissues superficial to the pectoralis major. No drainable fluid collection or abscess. No acute osseous pathology. No suspicious bone lesions. Review of the MIP images confirms the above findings. CT ABDOMEN and PELVIS FINDINGS No intra-abdominal free air or free fluid. Hepatobiliary: No focal liver abnormality is seen. No gallstones, gallbladder wall thickening, or biliary dilatation. Pancreas: Unremarkable. No pancreatic ductal dilatation or surrounding inflammatory changes. Spleen: Normal in size without focal abnormality. Adrenals/Urinary Tract: The adrenal glands unremarkable. There is no hydronephrosis on either side. There is symmetric enhancement and excretion of contrast by both kidneys. Multiple small bilateral parapelvic cysts noted. The visualized ureters and urinary bladder appear unremarkable. Stomach/Bowel: Small hiatal hernia. There is no bowel obstruction or active inflammation. There is sigmoid diverticulosis without active inflammatory changes. Moderate stool throughout the colon. The appendix is normal. Vascular/Lymphatic: The abdominal aorta and IVC unremarkable. No portal venous gas. There is no adenopathy. Reproductive: The  uterus is anteverted. Multiple small uterine fibroids. No adnexal masses. Other: Postsurgical changes with stranding of the subcutaneous soft tissues of the anterior abdominal wall. A drainage catheter noted in the subcutaneous soft tissues of the anterior abdominal wall. No drainable fluid collection. Musculoskeletal: No acute or significant osseous findings. Review of the MIP images confirms the above findings. IMPRESSION: 1. No acute intrathoracic pathology. No pulmonary artery embolus identified. 2. Postsurgical changes of the right mastectomy with reconstruction. No drainable fluid collection or abscess. 3. Sigmoid diverticulosis. No bowel obstruction. Normal appendix. Electronically Signed   By: AAnner CreteM.D.   On: 04/28/2022 00:43   CT ABDOMEN PELVIS W CONTRAST  Result Date: 04/28/2022 CLINICAL DATA:  Nausea and vomiting. Hematemesis. Concern for pulmonary embolism. History of breast cancer. EXAM: CT ANGIOGRAPHY CHEST CT ABDOMEN AND PELVIS WITH  CONTRAST TECHNIQUE: Multidetector CT imaging of the chest was performed using the standard protocol during bolus administration of intravenous contrast. Multiplanar CT image reconstructions and MIPs were obtained to evaluate the vascular anatomy. Multidetector CT imaging of the abdomen and pelvis was performed using the standard protocol during bolus administration of intravenous contrast. RADIATION DOSE REDUCTION: This exam was performed according to the departmental dose-optimization program which includes automated exposure control, adjustment of the mA and/or kV according to patient size and/or use of iterative reconstruction technique. CONTRAST:  171m OMNIPAQUE IOHEXOL 350 MG/ML SOLN COMPARISON:  CT of the chest abdomen pelvis dated 06/08/2010. FINDINGS: CTA CHEST FINDINGS Cardiovascular: There is no cardiomegaly or pericardial effusion. The thoracic aorta is unremarkable. The origins of the great vessels of the aortic arch appear patent as  visualized. No pulmonary artery embolus identified. Mediastinum/Nodes: No hilar or mediastinal the lump. Small hiatal hernia. The esophagus is grossly unremarkable. No mediastinal fluid collection. Lungs/Pleura: Probable trace bilateral pleural effusions versus pleural thickening or minimal atelectasis. Bibasilar linear atelectasis. No consolidative changes. No pneumothorax. There is a 3 mm right upper lobe nodule (52/5). Additional small nodules along the minor fissure anteriorly measure up to 3 mm (70/5). The central airways are patent. Musculoskeletal: Postsurgical changes of the right mastectomy with reconstruction. Multiple surgical clips noted. There is small pocket of air in the subcutaneous soft tissues superficial to the pectoralis major. No drainable fluid collection or abscess. No acute osseous pathology. No suspicious bone lesions. Review of the MIP images confirms the above findings. CT ABDOMEN and PELVIS FINDINGS No intra-abdominal free air or free fluid. Hepatobiliary: No focal liver abnormality is seen. No gallstones, gallbladder wall thickening, or biliary dilatation. Pancreas: Unremarkable. No pancreatic ductal dilatation or surrounding inflammatory changes. Spleen: Normal in size without focal abnormality. Adrenals/Urinary Tract: The adrenal glands unremarkable. There is no hydronephrosis on either side. There is symmetric enhancement and excretion of contrast by both kidneys. Multiple small bilateral parapelvic cysts noted. The visualized ureters and urinary bladder appear unremarkable. Stomach/Bowel: Small hiatal hernia. There is no bowel obstruction or active inflammation. There is sigmoid diverticulosis without active inflammatory changes. Moderate stool throughout the colon. The appendix is normal. Vascular/Lymphatic: The abdominal aorta and IVC unremarkable. No portal venous gas. There is no adenopathy. Reproductive: The uterus is anteverted. Multiple small uterine fibroids. No adnexal  masses. Other: Postsurgical changes with stranding of the subcutaneous soft tissues of the anterior abdominal wall. A drainage catheter noted in the subcutaneous soft tissues of the anterior abdominal wall. No drainable fluid collection. Musculoskeletal: No acute or significant osseous findings. Review of the MIP images confirms the above findings. IMPRESSION: 1. No acute intrathoracic pathology. No pulmonary artery embolus identified. 2. Postsurgical changes of the right mastectomy with reconstruction. No drainable fluid collection or abscess. 3. Sigmoid diverticulosis. No bowel obstruction. Normal appendix. Electronically Signed   By: AAnner CreteM.D.   On: 04/28/2022 00:43     LOS: 1 day   RAntonieta Pert MD [facility]  04/29/2022, 9:49 AM

## 2022-04-30 DIAGNOSIS — K922 Gastrointestinal hemorrhage, unspecified: Secondary | ICD-10-CM | POA: Diagnosis not present

## 2022-04-30 LAB — BASIC METABOLIC PANEL
Anion gap: 9 (ref 5–15)
BUN: 8 mg/dL (ref 6–20)
CO2: 23 mmol/L (ref 22–32)
Calcium: 8.3 mg/dL — ABNORMAL LOW (ref 8.9–10.3)
Chloride: 103 mmol/L (ref 98–111)
Creatinine, Ser: 0.7 mg/dL (ref 0.44–1.00)
GFR, Estimated: >60 mL/min (ref >60)
Glucose, Bld: 95 mg/dL (ref 70–99)
Potassium: 3.8 mmol/L (ref 3.5–5.1)
Sodium: 135 mmol/L (ref 135–145)

## 2022-04-30 LAB — CBC
HCT: 25.3 % — ABNORMAL LOW (ref 36.0–46.0)
Hemoglobin: 8.1 g/dL — ABNORMAL LOW (ref 12.0–15.0)
MCH: 28.1 pg (ref 26.0–34.0)
MCHC: 32 g/dL (ref 30.0–36.0)
MCV: 87.8 fL (ref 80.0–100.0)
Platelets: 341 10*3/uL (ref 150–400)
RBC: 2.88 MIL/uL — ABNORMAL LOW (ref 3.87–5.11)
RDW: 15.6 % — ABNORMAL HIGH (ref 11.5–15.5)
WBC: 12.6 10*3/uL — ABNORMAL HIGH (ref 4.0–10.5)
nRBC: 0 % (ref 0.0–0.2)

## 2022-04-30 LAB — HEMOGLOBIN AND HEMATOCRIT, BLOOD
HCT: 28.7 % — ABNORMAL LOW (ref 36.0–46.0)
Hemoglobin: 9.2 g/dL — ABNORMAL LOW (ref 12.0–15.0)

## 2022-04-30 NOTE — Progress Notes (Signed)
PROGRESS NOTE Penny Brown  BHA:[id number] DOB: [birth year] DOA: 04/27/2022 PCP: [name], FNP   Brief Narrative/Hospital Course: 84 F with history of right-sided breast cancer status posttreatment right mastectomy in June 2022 followed by recent breast reconstruction procedure chronic anemia who was discharged few days ago on aspirin to prevent DVT postoperatively presents with nausea, dark-colored emesis coffee-ground appearance epigastric discomfort.  Patient was in the ED was hemodynamically stable, underwent CT chest no acute PE noted postsurgical changes of the right mastectomy with reconstruction without evidence of drainable fluid collection or abscess CT abdomen pelvis sigmoid diverticulosis no evidence of diverticulitis or any acute pathology. Patient was placed on Protonix drip GI consulted and admitted.Underwent EGD-that showed:-LA Grade B reflux esophagitis with no bleeding.Small hiatal hernia.Erosive gastropathy with no bleeding and no stigmata of recent bleeding. Biopsied, Non-bleeding duodenal ulcer with a nonbleeding, visible vessel (Forrest Class IIa).Treated with bipolar cautery.Non-bleeding duodenal ulcer with a clean ulcer base (Forrest Class III). Patient placed on diet advised to stay off aspirin NSAIDs, to continue antireflux measures.    Subjective: Seen and examined.  Husband at the bedside Tolerating diet had regular bowel movement no nausea vomiting Overnight no fever, BP stable no reports of bleeding Fluctuating hemoglobin  Assessment and Plan: Principal Problem:   Acute upper GI bleed Active Problems:   Acute blood loss anemia   Leukocytosis   Hematemesis   Duodenal ulcer   Erosive gastritis   Acute upper GI bleeding with hematemesis x2 Acute blood loss anemia due to #1 along with anemia of chronic disease: S/P egd- LA Grade B reflux esophagitis with no bleeding.Small hiatal hernia.Erosive gastropathy with no bleeding and no stigmata of recent  bleeding. Biopsied, Non-bleeding duodenal ulcer with a nonbleeding, visible vessel (Forrest Class IIa).Treated with bipolar cautery.Non-bleeding duodenal ulcer with a clean ulcer base (Forrest Class III).  Appreciate GI input continue current PPI, diet.  GI recommends to monitor 1 more day due to fluctuating hemoglobin that dropped to 8.1 g.  Continue to monitor serial hemoglobin. Some of the drop could be due to patient's serosanguinuous abdominal drain. She had 1 unit transfused 8/25. Recent Labs  Lab 04/28/22 1958 04/29/22 0413 04/29/22 1149 04/29/22 2150 04/30/22 0237  HGB 8.8* 7.9* 8.8* 8.8* 8.1*  HCT 26.3* 23.8* 26.7* 27.6* 25.3*   Leukocytosis: With recent surgery, unclear etiology.Monitor closely for any signs of infection. Recent breast reconstruction surgery 8/18-she has drains in place abdominal site with glue in place, serosanguineous drainage.  Follow-up outpatient surgery  Mild hyponatremia resolved.   Overweight with Body mass index is 29.79 kg/m. : Will benefit with PCP follow-up, weight loss  healthy lifestyle and outpatient sleep evaluation.  DVT prophylaxis: SCDs Start: 04/28/22 9735 Code Status:   Code Status: Full Code Family Communication: plan of care discussed with patient/husband at bedside. Patient status is: Inpatient because of monitoring of hemoglobin due to GI bleeding with emesis Level of care: Progressive   Dispo: The patient is from: Home            Anticipated disposition: Home in 24 hrs once cleared by GI and if hemoglobin remains stable  Mobility Assessment (last 72 hours)     Mobility Assessment     Row Name 04/29/22 2044 04/29/22 0836         Does patient have an order for bedrest or is patient medically unstable No - Continue assessment No - Continue assessment      What is the highest level of mobility based on the progressive  mobility assessment? Level 6 (Walks independently in room and hall) - Balance while walking in room without assist -  Complete Level 6 (Walks independently in room and hall) - Balance while walking in room without assist - Complete                Objective: Vitals last 24 hrs: Vitals:   04/29/22 0425 04/29/22 0836 04/29/22 2044 04/30/22 0638  BP: 104/66 103/67 131/83 117/71  Pulse: (!) 109 78  96  Resp: '16 18 18 16  '$ Temp: 98 F (36.7 C) 98.1 F (36.7 C) 98 F (36.7 C) (!) 97.5 F (36.4 C)  TempSrc: Oral Oral Oral Oral  SpO2: 98%   98%  Weight: 73.9 kg     Height:       Weight change:   Physical Examination: General exam: AAox3, older than stated age, weak appearing. HEENT:Oral mucosa moist, Ear/Nose WNL grossly, dentition normal. Respiratory system: bilaterally diminished, no use of accessory muscle Cardiovascular system: S1 & S2 +, No JVD,. Gastrointestinal system: Abdomen soft, drain x2 present with incision site  glued/taped, no tenderness internasal redness,BS+ Nervous System:Alert, awake, moving extremities and grossly nonfocal Extremities: LE ankle edema neg, distal peripheral pulses palpable.  Skin: No rashes,no icterus. MSK: Normal muscle bulk,tone, power   Medications reviewed:  Scheduled Meds:  [START ON 05/01/2022] pantoprazole  40 mg Intravenous Q12H   Continuous Infusions:  pantoprazole 8 mg/hr (04/30/22 0752)      Diet Order             Diet regular Room service appropriate? Yes; Fluid consistency: Thin  Diet effective now                   Intake/Output Summary (Last 24 hours) at 04/30/2022 0922 Last data filed at 04/29/2022 2142 Gross per 24 hour  Intake 166.31 ml  Output --  Net 166.31 ml   Net IO Since Admission: 1,305.45 mL [04/30/22 0922]  Wt Readings from Last 3 Encounters:  04/29/22 73.9 kg  11/11/21 76.9 kg  10/20/21 75.4 kg     Unresulted Labs (From admission, onward)     Start     Ordered   04/30/22 1300  Hemoglobin and hematocrit, blood  Once,   R        04/30/22 0742   04/30/22 0500  CBC  Daily at 5am,   R      04/29/22 0910    04/29/22 6789  Basic metabolic panel  Daily at 5am,   R      04/28/22 1117   04/29/22 0500  CBC  Daily at 5am,   R      04/28/22 1117          Data Reviewed: I have personally reviewed following labs and imaging studies CBC: Recent Labs  Lab 04/27/22 1416 04/27/22 2310 04/28/22 0207 04/28/22 0432 04/28/22 1130 04/28/22 1958 04/29/22 0413 04/29/22 1149 04/29/22 2150 04/30/22 0237  WBC 11.9* 12.7*  --  12.7*  --   --  11.4*  --   --  12.6*  NEUTROABS 8.0* 7.9*  --  8.5*  --   --   --   --   --   --   HGB 9.8* 9.5*   < > 7.8*   < > 8.8* 7.9* 8.8* 8.8* 8.1*  HCT 33.0* 30.4*   < > 24.6*   < > 26.3* 23.8* 26.7* 27.6* 25.3*  MCV 91.4 88.6  --  87.9  --   --  85.6  --   --  87.8  PLT 348 372  --  308  --   --  288  --   --  341   < > = values in this interval not displayed.   Basic Metabolic Panel: Recent Labs  Lab 04/27/22 1416 04/28/22 0432 04/29/22 0413 04/30/22 0237  NA 134* 133* 135 135  K 4.2 4.0 4.0 3.8  CL 101 101 103 103  CO2 '24 24 24 23  '$ GLUCOSE 102* 107* 93 95  BUN '12 17 11 8  '$ CREATININE 0.68 0.63 0.70 0.70  CALCIUM 9.1 8.4* 8.5* 8.3*  MG  --  1.7  --   --    GFR: Estimated Creatinine Clearance: 78.3 mL/min (by C-G formula based on SCr of 0.7 mg/dL). Liver Function Tests: Recent Labs  Lab 04/27/22 1416 04/28/22 0432  AST 17 13*  ALT 12 12  ALKPHOS 61 54  BILITOT 0.3 0.4  PROT 6.9 5.8*  ALBUMIN 3.2* 2.8*   Recent Labs  Lab 04/27/22 1416  LIPASE 28   No results for input(s): "AMMONIA" in the last 168 hours. Coagulation Profile: Recent Labs  Lab 04/27/22 2310  INR 1.2  other culture-see note  Radiology Studies: No results found.   LOS: 2 days   Antonieta Pert, MD [facility]  04/30/2022, 9:22 AM

## 2022-04-30 NOTE — Progress Notes (Signed)
Subjective: No complaints.  She feels well.  No reports of melena, hematochezia, or hematemesis.  Objective: Vital signs in last 24 hours: Temp:  [97.5 F (36.4 C)-98.1 F (36.7 C)] 97.5 F (36.4 C) (08/27 1749) Pulse Rate:  [78-96] 96 (08/27 0638) Resp:  [16-18] 16 (08/27 4496) BP: (103-131)/(67-83) 117/71 (08/27 7591) SpO2:  [98 %] 98 % (08/27 6384) Last BM Date : 04/29/22  Intake/Output from previous day: 08/26 0701 - 08/27 0700 In: 166.3 [I.V.:166.3] Out: -  Intake/Output this shift: No intake/output data recorded.  General appearance: alert and no distress GI: soft, non-tender; bowel sounds normal; no masses,  no organomegaly  Lab Results: Recent Labs    04/28/22 0432 04/28/22 1130 04/29/22 0413 04/29/22 1149 04/29/22 2150 04/30/22 0237  WBC 12.7*  --  11.4*  --   --  12.6*  HGB 7.8*   < > 7.9* 8.8* 8.8* 8.1*  HCT 24.6*   < > 23.8* 26.7* 27.6* 25.3*  PLT 308  --  288  --   --  341   < > = values in this interval not displayed.   BMET Recent Labs    04/28/22 0432 04/29/22 0413 04/30/22 0237  NA 133* 135 135  K 4.0 4.0 3.8  CL 101 103 103  CO2 '24 24 23  '$ GLUCOSE 107* 93 95  BUN '17 11 8  '$ CREATININE 0.63 0.70 0.70  CALCIUM 8.4* 8.5* 8.3*   LFT Recent Labs    04/28/22 0432  PROT 5.8*  ALBUMIN 2.8*  AST 13*  ALT 12  ALKPHOS 54  BILITOT 0.4   PT/INR Recent Labs    04/27/22 2310  LABPROT 14.6  INR 1.2   Hepatitis Panel No results for input(s): "HEPBSAG", "HCVAB", "HEPAIGM", "HEPBIGM" in the last 72 hours. C-Diff No results for input(s): "CDIFFTOX" in the last 72 hours. Fecal Lactopherrin No results for input(s): "FECLLACTOFRN" in the last 72 hours.  Studies/Results: No results found.  Medications: Scheduled:  gabapentin  100 mg Oral TID   [START ON 05/01/2022] pantoprazole  40 mg Intravenous Q12H   Continuous:  pantoprazole 8 mg/hr (04/29/22 2142)    Assessment/Plan: 1) DU with visible vessel s/p ablation. 2) Anemia.   Her HGB  did decline, but there was no overt evidence of bleeding.  The hope was that her HGB was going to remain stable.  It is best to monitor the patient another day as she had a significant finding with the EGD.    Plan: 1) Maintain pantoprazole. 2) Monitor HGB. 3) Dr. Candis Schatz will resume care in the AM.  LOS: 2 days   Penny Brown 04/30/2022, 7:36 AM

## 2022-05-01 ENCOUNTER — Encounter (HOSPITAL_COMMUNITY): Payer: Self-pay | Admitting: Gastroenterology

## 2022-05-01 ENCOUNTER — Encounter: Payer: Self-pay | Admitting: Gastroenterology

## 2022-05-01 DIAGNOSIS — K922 Gastrointestinal hemorrhage, unspecified: Secondary | ICD-10-CM | POA: Diagnosis not present

## 2022-05-01 LAB — SURGICAL PATHOLOGY

## 2022-05-01 LAB — BASIC METABOLIC PANEL
Anion gap: 8 (ref 5–15)
BUN: 7 mg/dL (ref 6–20)
CO2: 27 mmol/L (ref 22–32)
Calcium: 8.8 mg/dL — ABNORMAL LOW (ref 8.9–10.3)
Chloride: 102 mmol/L (ref 98–111)
Creatinine, Ser: 0.77 mg/dL (ref 0.44–1.00)
GFR, Estimated: >60 mL/min (ref >60)
Glucose, Bld: 89 mg/dL (ref 70–99)
Potassium: 3.6 mmol/L (ref 3.5–5.1)
Sodium: 137 mmol/L (ref 135–145)

## 2022-05-01 LAB — CBC
HCT: 28.3 % — ABNORMAL LOW (ref 36.0–46.0)
Hemoglobin: 9.2 g/dL — ABNORMAL LOW (ref 12.0–15.0)
MCH: 28.6 pg (ref 26.0–34.0)
MCHC: 32.5 g/dL (ref 30.0–36.0)
MCV: 87.9 fL (ref 80.0–100.0)
Platelets: 409 10*3/uL — ABNORMAL HIGH (ref 150–400)
RBC: 3.22 MIL/uL — ABNORMAL LOW (ref 3.87–5.11)
RDW: 15.6 % — ABNORMAL HIGH (ref 11.5–15.5)
WBC: 13.9 10*3/uL — ABNORMAL HIGH (ref 4.0–10.5)
nRBC: 0 % (ref 0.0–0.2)

## 2022-05-01 MED ORDER — PANTOPRAZOLE SODIUM 40 MG PO TBEC
40.0000 mg | DELAYED_RELEASE_TABLET | Freq: Two times a day (BID) | ORAL | Status: DC
  Administered 2022-05-01: 40 mg via ORAL
  Filled 2022-05-01: qty 1

## 2022-05-01 MED ORDER — PANTOPRAZOLE SODIUM 40 MG PO TBEC
40.0000 mg | DELAYED_RELEASE_TABLET | Freq: Two times a day (BID) | ORAL | 0 refills | Status: DC
Start: 2022-05-01 — End: 2022-06-09

## 2022-05-01 NOTE — Progress Notes (Addendum)
Daily Rounding Note  05/01/2022, 12:58 PM  LOS: 3 days   SUBJECTIVE:   Chief complaint:    GI bleed, duodenal ulcers.  Patient feels well.  No dizziness or weakness.  No shortness of breath.  No abdominal pain.  Stool today was brown. Tolerating solid food. Asking questions about her elevated WBCs which are in the 13-14 range today.  Has not had any fevers.  OBJECTIVE:         Vital signs in last 24 hours:    Temp:  [97.6 F (36.4 C)-98.5 F (36.9 C)] 97.6 F (36.4 C) (08/28 0531) Pulse Rate:  [96-109] 96 (08/28 0531) Resp:  [16-17] 16 (08/28 0531) BP: (128-142)/(69-81) 142/81 (08/28 0531) SpO2:  [98 %-100 %] 100 % (08/28 0531) Weight:  [74 kg] 74 kg (08/28 0500) Last BM Date : 04/29/22 Filed Weights   04/28/22 1752 04/29/22 0425 05/01/22 0500  Weight: 75.8 kg 73.9 kg 74 kg   General: Pleasant, well-appearing. Heart: RRR. Chest: Clear bilaterally without labored breathing or cough Abdomen: Tender or distended.  Active bowel sounds. Extremities: CCE. Neuro/Psych: Calm, pleasant, fluid speech.  Appropriate questions.  Intake/Output from previous day: 08/27 0701 - 08/28 0700 In: 757.2 [P.O.:480; I.V.:277.2] Out: -   Intake/Output this shift: No intake/output data recorded.  Lab Results: Recent Labs    04/29/22 0413 04/29/22 1149 04/30/22 0237 04/30/22 1917 05/01/22 0745  WBC 11.4*  --  12.6*  --  13.9*  HGB 7.9*   < > 8.1* 9.2* 9.2*  HCT 23.8*   < > 25.3* 28.7* 28.3*  PLT 288  --  341  --  409*   < > = values in this interval not displayed.   BMET Recent Labs    04/29/22 0413 04/30/22 0237 05/01/22 0745  NA 135 135 137  K 4.0 3.8 3.6  CL 103 103 102  CO2 '24 23 27  '$ GLUCOSE 93 95 89  BUN '11 8 7  '$ CREATININE 0.70 0.70 0.77  CALCIUM 8.5* 8.3* 8.8*   LFT No results for input(s): "PROT", "ALBUMIN", "AST", "ALT", "ALKPHOS", "BILITOT", "BILIDIR", "IBILI" in the last 72 hours. PT/INR No results  for input(s): "LABPROT", "INR" in the last 72 hours. Hepatitis Panel No results for input(s): "HEPBSAG", "HCVAB", "HEPAIGM", "HEPBIGM" in the last 72 hours.  Studies/Results: No results found.  ASSESMENT:   Upper GI bleed.  Hematemesis.   EGD: erosive gastropathy, non-bleeding/clean based duodenal ulcer w non-bleeding VV treated w bicap, 2nd clean based DU.  Protonix 40 mg po bid in place.  Taking ibuprofen PTA.  Acute blood loss anemia. 1 PRBC on 8/25.  Hgb 7.8 ... 9.2.     This cancer, recent reconstructive surgery in [place].  Occasional constipation, treats this with Linzess prn.    Leukocytosis.  Original CT of chest did show some trace pleural effusions versus pleural thickening and or minor ATX.  There was no pneumonia.  Patient has not had fever.  Urinalysis was -4 days ago.   PLAN   From the GI standpoint, patient okay for discharge home.  Should take Protonix 40 mg p.o. twice daily, or equivalent PPI, for 1 month then drop to once daily.  GI office will arrange follow-up visit with either APP or Dr. Fuller Plan.  She may require repeat EGD to assess for healing of ulcers.  At the same time she could have screening colonoscopy which she has never had.  Patient aware to avoid all NSAIDs from  here forward.  Will message attending physician regarding the patient's concerns about her leukocytosis.    Azucena Freed  05/01/2022, 12:58 PM Phone [phone]    I have reviewed the chart. I agree with the APP's note, impression and recommendations.  The patient was discharged before I was available to see the patient.  Andru Genter E. Candis Schatz, MD [facility]

## 2022-05-01 NOTE — TOC Progression Note (Signed)
Transition of Care [facility]) - Progression Note    Patient Details  Name: Penny Brown MRN: [id number] Date of Birth: 01-17-1971  [facility] CM/SW Contact  Zenon Mayo, RN Phone Number: 05/01/2022, 12:02 PM  Clinical Narrative:    Patient is from home with spouse, presents with upper GIB.  She is indep. She states she has PCP STRUP, KATHRYN.  She has transport home, she has no other needs.        Expected Discharge Plan and Services                                                 Social Determinants of Health (SDOH) Interventions    Readmission Risk Interventions     No data to display

## 2022-05-01 NOTE — Progress Notes (Signed)
Pt IV protonix infiltrated.  IV discontinued.  Pt for am discharge with Hgb 9.2, no signs of bleeding, no n/v or other symptoms.  Pt prefers to not have another IV inserted due to planned discharge in am.  Dr. Marlyce Huge notified with new orders to transition to po protonix. Will continue to monitor pt.

## 2022-05-01 NOTE — Discharge Summary (Signed)
Physician Discharge Summary  DEVEN AUDI VOJ:[id number] DOB: [birth year] DOA: 04/27/2022  PCP: [name], FNP  Admit date: 04/27/2022 Discharge date: 05/01/2022 Recommendations for Outpatient Follow-up:  Follow up with PCP in 1 weeks-call for appointment Please obtain BMP/CBC in one week Please follow-up with your surgeon about your recent surgery  Discharge Dispo: home Discharge Condition: Stable Code Status:   Code Status: Full Code Diet recommendation:  Diet Order             Diet regular Room service appropriate? Yes; Fluid consistency: Thin  Diet effective now                    Brief/Interim Summary: 88 F with history of right-sided breast cancer status posttreatment right mastectomy in June 2022 followed by recent breast reconstruction procedure chronic anemia who was discharged few days ago on aspirin to prevent DVT postoperatively presents with nausea, dark-colored emesis coffee-ground appearance epigastric discomfort.  Patient was in the ED was hemodynamically stable, underwent CT chest no acute PE noted postsurgical changes of the right mastectomy with reconstruction without evidence of drainable fluid collection or abscess CT abdomen pelvis sigmoid diverticulosis no evidence of diverticulitis or any acute pathology. Patient was placed on Protonix drip GI consulted and admitted.Underwent EGD-that showed:-LA Grade B reflux esophagitis with no bleeding.Small hiatal hernia.Erosive gastropathy with no bleeding and no stigmata of recent bleeding. Biopsied, Non-bleeding duodenal ulcer with a nonbleeding, visible vessel (Forrest Class IIa).Treated with bipolar cautery.Non-bleeding duodenal ulcer with a clean ulcer base (Forrest Class III). Patient placed on diet advised to stay off aspirin NSAIDs, to continue antireflux measures.  Hemoglobin has stabilized and ranging in 9 g range.  Patient is medically stable  Patient treated with Protonix drip> changed to oral PPI twice  daily 8/28 tolerating diet has had regular bowel movement hemoglobin has been stable in 9 g or higher.  Discharge Diagnoses:  Principal Problem:   Acute upper GI bleed Active Problems:   Acute blood loss anemia   Leukocytosis   Hematemesis   Duodenal ulcer   Erosive gastritis  Acute upper GI bleeding with hematemesis x2 Acute blood loss anemia due to #1 along with anemia of chronic disease: S/P egd- LA Grade B reflux esophagitis with no bleeding.Small hiatal hernia.Erosive gastropathy with no bleeding and no stigmata of recent bleeding. Biopsied, Non-bleeding duodenal ulcer with a nonbleeding, visible vessel (Forrest Class IIa).Treated with bipolar cautery.Non-bleeding duodenal ulcer with a clean ulcer base (Forrest Class III).  Appreciate GI input continue current PPI, diet.  GI recommends to monitor 1 more day due to fluctuating hemoglobin that dropped to 8.1 g.  Continue to monitor serial hemoglobin. Some of the drop could be due to patient's serosanguinuous abdominal drain. She had 1 unit transfused 8/25  Acute upper GI bleeding with hematemesis x2 Acute blood loss anemia due to #1 along with anemia of chronic disease: S/P egd- LA Grade B reflux esophagitis with no bleeding.Small hiatal hernia.Erosive gastropathy with no bleeding and no stigmata of recent bleeding. Biopsied, Non-bleeding duodenal ulcer with a nonbleeding, visible vessel (Forrest Class IIa).Treated with bipolar cautery.Non-bleeding duodenal ulcer with a clean ulcer base (Forrest Class III). treated with Protonix drip> changed to oral PPI twice daily 8/28 tolerating diet has had regular bowel movement hemoglobin has been stable in 9 g or higher. Recent Labs  Lab 04/29/22 1149 04/29/22 2150 04/30/22 0237 04/30/22 1917 05/01/22 0745  HGB 8.8* 8.8* 8.1* 9.2* 9.2*  HCT 26.7* 27.6* 25.3* 28.7* 28.3*  Leukocytosis: With  recent surgery, unclear etiology.?  Reactive, patient denies any pain tenderness redness erythema on  adjacent surgical site, has 2 abdominal drains in place.  He is afebrile.  She will need close follow-up with PCP and with her surgery team.  Advised checking CBC to ensure WBC is improving towards right direction in next 3 to 4 days if not may need to look into other etiologies.  CT head CTA on admission no acute intrathoracic pathology, had postsurgical changes of the right mastectomy with reconstruction no drainable fluid collection or abscess noted.  Current no signs of infection noted in surgical site.  I again instructed the patient to alert her MD if she sees any signs of infection and to repeat her CBC this week.  Recent Labs  Lab 04/27/22 2310 04/28/22 0432 04/29/22 0413 04/30/22 0237 05/01/22 0745  WBC 12.7* 12.7* 11.4* 12.6* 13.9*    Recent breast reconstruction surgery 8/18-she has drains in place abdominal site with glue in place, serosanguineous drainage.  No new complaints- she neds follow-up outpatient surgery   Mild hyponatremia resolved.   Overweight with Body mass index is 29.79 kg/m. : Will benefit with PCP follow-up, weight loss  healthy lifestyle and outpatient sleep evaluation.   Consults: GI Subjective: Alert oriented resting comfortably she feels ready for discharge home today She denies any new pain fever chills.  Discharge Exam: Vitals:   05/01/22 0531 05/01/22 1301  BP: (!) 142/81 133/77  Pulse: 96 (!) 102  Resp: 16 17  Temp: 97.6 F (36.4 C) 98.5 F (36.9 C)  SpO2: 100% 100%   General: Pt is alert, awake, not in acute distress Cardiovascular: RRR, S1/S2 +, no rubs, no gallops Respiratory: CTA bilaterally, no wheezing, no rhonchi Abdominal: Soft, NT, ND, bowel sounds + Extremities: no edema, no cyanosis  Discharge Instructions  Discharge Instructions     Discharge instructions   Complete by: As directed    Please check CBC from the doctor within this week. Follow-up with your PCP within the next week.  Please call call MD or return to ER for  similar or worsening recurring problem that brought you to hospital or if any fever,nausea/vomiting,abdominal pain, uncontrolled pain, chest pain,  shortness of breath or any other alarming symptoms-or any signs of infection specially around the surgical site  Please follow-up your doctor as instructed in a week time and call the office for appointment.  Please avoid alcohol, smoking, or any other illicit substance and maintain healthy habits including taking your regular medications as prescribed.  You were cared for by a hospitalist during your hospital stay. If you have any questions about your discharge medications or the care you received while you were in the hospital after you are discharged, you can call the unit and ask to speak with the hospitalist on call if the hospitalist that took care of you is not available.  Once you are discharged, your primary care physician will handle any further medical issues. Please note that NO REFILLS for any discharge medications will be authorized once you are discharged, as it is imperative that you return to your primary care physician (or establish a relationship with a primary care physician if you do not have one) for your aftercare needs so that they can reassess your need for medications and monitor your lab values   Discharge wound care:   Complete by: As directed    Wound care per your surgery team   Increase activity slowly   Complete by: As directed  Allergies as of 05/01/2022   No Known Allergies      Medication List     STOP taking these medications    aspirin EC 325 MG tablet   ibuprofen 600 MG tablet Commonly known as: ADVIL       TAKE these medications    acetaminophen 500 MG tablet Commonly known as: TYLENOL Take 500 mg by mouth every 4 (four) hours as needed (pain).   albuterol 108 (90 Base) MCG/ACT inhaler Commonly known as: VENTOLIN HFA Inhale 2 puffs into the lungs every 6 (six) hours as needed for  wheezing or shortness of breath.   docusate sodium 100 MG capsule Commonly known as: COLACE Take 100 mg by mouth 2 (two) times daily.   doxycycline 100 MG capsule Commonly known as: VIBRAMYCIN Take 100 mg by mouth 2 (two) times daily.   linaclotide 72 MCG capsule Commonly known as: LINZESS Take 72 mcg by mouth daily as needed (constipation).   ondansetron 4 MG disintegrating tablet Commonly known as: ZOFRAN-ODT Take 4 mg by mouth every 6 (six) hours as needed for nausea or vomiting.   oxyCODONE 5 MG immediate release tablet Commonly known as: Oxy IR/ROXICODONE Take 5 mg by mouth every 6 (six) hours as needed for severe pain.   pantoprazole 40 MG tablet Commonly known as: PROTONIX Take 1 tablet (40 mg total) by mouth 2 (two) times daily before a meal.   spironolactone 50 MG tablet Commonly known as: ALDACTONE Take 50 mg by mouth 2 (two) times daily.               Discharge Care Instructions  (From admission, onward)           Start     Ordered   05/01/22 0000  Discharge wound care:       Comments: Wound care per your surgery team   05/01/22 1430            Follow-up Information     [name], FNP Follow up.   Specialty: Nurse Practitioner Contact information: [place] [address] [phone]                No Known Allergies  The results of significant diagnostics from this hospitalization (including imaging, microbiology, ancillary and laboratory) are listed below for reference.    Microbiology: No results found for this or any previous visit (from the past 240 hour(s)).  Procedures/Studies: CT Angio Chest PE W and/or Wo Contrast  Result Date: 04/28/2022 CLINICAL DATA:  Nausea and vomiting. Hematemesis. Concern for pulmonary embolism. History of breast cancer. EXAM: CT ANGIOGRAPHY CHEST CT ABDOMEN AND PELVIS WITH CONTRAST TECHNIQUE: Multidetector CT imaging of the chest was performed using the standard  protocol during bolus administration of intravenous contrast. Multiplanar CT image reconstructions and MIPs were obtained to evaluate the vascular anatomy. Multidetector CT imaging of the abdomen and pelvis was performed using the standard protocol during bolus administration of intravenous contrast. RADIATION DOSE REDUCTION: This exam was performed according to the departmental dose-optimization program which includes automated exposure control, adjustment of the mA and/or kV according to patient size and/or use of iterative reconstruction technique. CONTRAST:  115m OMNIPAQUE IOHEXOL 350 MG/ML SOLN COMPARISON:  CT of the chest abdomen pelvis dated 06/08/2010. FINDINGS: CTA CHEST FINDINGS Cardiovascular: There is no cardiomegaly or pericardial effusion. The thoracic aorta is unremarkable. The origins of the great vessels of the aortic arch appear patent as visualized. No pulmonary artery embolus identified. Mediastinum/Nodes: No  hilar or mediastinal the lump. Small hiatal hernia. The esophagus is grossly unremarkable. No mediastinal fluid collection. Lungs/Pleura: Probable trace bilateral pleural effusions versus pleural thickening or minimal atelectasis. Bibasilar linear atelectasis. No consolidative changes. No pneumothorax. There is a 3 mm right upper lobe nodule (52/5). Additional small nodules along the minor fissure anteriorly measure up to 3 mm (70/5). The central airways are patent. Musculoskeletal: Postsurgical changes of the right mastectomy with reconstruction. Multiple surgical clips noted. There is small pocket of air in the subcutaneous soft tissues superficial to the pectoralis major. No drainable fluid collection or abscess. No acute osseous pathology. No suspicious bone lesions. Review of the MIP images confirms the above findings. CT ABDOMEN and PELVIS FINDINGS No intra-abdominal free air or free fluid. Hepatobiliary: No focal liver abnormality is seen. No gallstones, gallbladder wall thickening,  or biliary dilatation. Pancreas: Unremarkable. No pancreatic ductal dilatation or surrounding inflammatory changes. Spleen: Normal in size without focal abnormality. Adrenals/Urinary Tract: The adrenal glands unremarkable. There is no hydronephrosis on either side. There is symmetric enhancement and excretion of contrast by both kidneys. Multiple small bilateral parapelvic cysts noted. The visualized ureters and urinary bladder appear unremarkable. Stomach/Bowel: Small hiatal hernia. There is no bowel obstruction or active inflammation. There is sigmoid diverticulosis without active inflammatory changes. Moderate stool throughout the colon. The appendix is normal. Vascular/Lymphatic: The abdominal aorta and IVC unremarkable. No portal venous gas. There is no adenopathy. Reproductive: The uterus is anteverted. Multiple small uterine fibroids. No adnexal masses. Other: Postsurgical changes with stranding of the subcutaneous soft tissues of the anterior abdominal wall. A drainage catheter noted in the subcutaneous soft tissues of the anterior abdominal wall. No drainable fluid collection. Musculoskeletal: No acute or significant osseous findings. Review of the MIP images confirms the above findings. IMPRESSION: 1. No acute intrathoracic pathology. No pulmonary artery embolus identified. 2. Postsurgical changes of the right mastectomy with reconstruction. No drainable fluid collection or abscess. 3. Sigmoid diverticulosis. No bowel obstruction. Normal appendix. Electronically Signed   By: Anner Crete M.D.   On: 04/28/2022 00:43   CT ABDOMEN PELVIS W CONTRAST  Result Date: 04/28/2022 CLINICAL DATA:  Nausea and vomiting. Hematemesis. Concern for pulmonary embolism. History of breast cancer. EXAM: CT ANGIOGRAPHY CHEST CT ABDOMEN AND PELVIS WITH CONTRAST TECHNIQUE: Multidetector CT imaging of the chest was performed using the standard protocol during bolus administration of intravenous contrast. Multiplanar CT  image reconstructions and MIPs were obtained to evaluate the vascular anatomy. Multidetector CT imaging of the abdomen and pelvis was performed using the standard protocol during bolus administration of intravenous contrast. RADIATION DOSE REDUCTION: This exam was performed according to the departmental dose-optimization program which includes automated exposure control, adjustment of the mA and/or kV according to patient size and/or use of iterative reconstruction technique. CONTRAST:  137m OMNIPAQUE IOHEXOL 350 MG/ML SOLN COMPARISON:  CT of the chest abdomen pelvis dated 06/08/2010. FINDINGS: CTA CHEST FINDINGS Cardiovascular: There is no cardiomegaly or pericardial effusion. The thoracic aorta is unremarkable. The origins of the great vessels of the aortic arch appear patent as visualized. No pulmonary artery embolus identified. Mediastinum/Nodes: No hilar or mediastinal the lump. Small hiatal hernia. The esophagus is grossly unremarkable. No mediastinal fluid collection. Lungs/Pleura: Probable trace bilateral pleural effusions versus pleural thickening or minimal atelectasis. Bibasilar linear atelectasis. No consolidative changes. No pneumothorax. There is a 3 mm right upper lobe nodule (52/5). Additional small nodules along the minor fissure anteriorly measure up to 3 mm (70/5). The central airways are patent. Musculoskeletal:  Postsurgical changes of the right mastectomy with reconstruction. Multiple surgical clips noted. There is small pocket of air in the subcutaneous soft tissues superficial to the pectoralis major. No drainable fluid collection or abscess. No acute osseous pathology. No suspicious bone lesions. Review of the MIP images confirms the above findings. CT ABDOMEN and PELVIS FINDINGS No intra-abdominal free air or free fluid. Hepatobiliary: No focal liver abnormality is seen. No gallstones, gallbladder wall thickening, or biliary dilatation. Pancreas: Unremarkable. No pancreatic ductal  dilatation or surrounding inflammatory changes. Spleen: Normal in size without focal abnormality. Adrenals/Urinary Tract: The adrenal glands unremarkable. There is no hydronephrosis on either side. There is symmetric enhancement and excretion of contrast by both kidneys. Multiple small bilateral parapelvic cysts noted. The visualized ureters and urinary bladder appear unremarkable. Stomach/Bowel: Small hiatal hernia. There is no bowel obstruction or active inflammation. There is sigmoid diverticulosis without active inflammatory changes. Moderate stool throughout the colon. The appendix is normal. Vascular/Lymphatic: The abdominal aorta and IVC unremarkable. No portal venous gas. There is no adenopathy. Reproductive: The uterus is anteverted. Multiple small uterine fibroids. No adnexal masses. Other: Postsurgical changes with stranding of the subcutaneous soft tissues of the anterior abdominal wall. A drainage catheter noted in the subcutaneous soft tissues of the anterior abdominal wall. No drainable fluid collection. Musculoskeletal: No acute or significant osseous findings. Review of the MIP images confirms the above findings. IMPRESSION: 1. No acute intrathoracic pathology. No pulmonary artery embolus identified. 2. Postsurgical changes of the right mastectomy with reconstruction. No drainable fluid collection or abscess. 3. Sigmoid diverticulosis. No bowel obstruction. Normal appendix. Electronically Signed   By: Anner Crete M.D.   On: 04/28/2022 00:43    Labs: BNP (last 3 results) No results for input(s): "BNP" in the last 8760 hours. Basic Metabolic Panel: Recent Labs  Lab 04/27/22 1416 04/28/22 0432 04/29/22 0413 04/30/22 0237 05/01/22 0745  NA 134* 133* 135 135 137  K 4.2 4.0 4.0 3.8 3.6  CL 101 101 103 103 102  CO2 '24 24 24 23 27  '$ GLUCOSE 102* 107* 93 95 89  BUN '12 17 11 8 7  '$ CREATININE 0.68 0.63 0.70 0.70 0.77  CALCIUM 9.1 8.4* 8.5* 8.3* 8.8*  MG  --  1.7  --   --   --    Liver  Function Tests: Recent Labs  Lab 04/27/22 1416 04/28/22 0432  AST 17 13*  ALT 12 12  ALKPHOS 61 54  BILITOT 0.3 0.4  PROT 6.9 5.8*  ALBUMIN 3.2* 2.8*   Recent Labs  Lab 04/27/22 1416  LIPASE 28   No results for input(s): "AMMONIA" in the last 168 hours. CBC: Recent Labs  Lab 04/27/22 1416 04/27/22 2310 04/28/22 0207 04/28/22 0432 04/28/22 1130 04/29/22 0413 04/29/22 1149 04/29/22 2150 04/30/22 0237 04/30/22 1917 05/01/22 0745  WBC 11.9* 12.7*  --  12.7*  --  11.4*  --   --  12.6*  --  13.9*  NEUTROABS 8.0* 7.9*  --  8.5*  --   --   --   --   --   --   --   HGB 9.8* 9.5*   < > 7.8*   < > 7.9* 8.8* 8.8* 8.1* 9.2* 9.2*  HCT 33.0* 30.4*   < > 24.6*   < > 23.8* 26.7* 27.6* 25.3* 28.7* 28.3*  MCV 91.4 88.6  --  87.9  --  85.6  --   --  87.8  --  87.9  PLT 348 372  --  308  --  288  --   --  341  --  409*   < > = values in this interval not displayed.   Cardiac Enzymes: No results for input(s): "CKTOTAL", "CKMB", "CKMBINDEX", "TROPONINI" in the last 168 hours. BNP: Invalid input(s): "POCBNP" CBG: Recent Labs  Lab 04/29/22 1247  GLUCAP 115*   D-Dimer No results for input(s): "DDIMER" in the last 72 hours. Hgb A1c No results for input(s): "HGBA1C" in the last 72 hours. Lipid Profile No results for input(s): "CHOL", "HDL", "LDLCALC", "TRIG", "CHOLHDL", "LDLDIRECT" in the last 72 hours. Thyroid function studies No results for input(s): "TSH", "T4TOTAL", "T3FREE", "THYROIDAB" in the last 72 hours.  Invalid input(s): "FREET3" Anemia work up No results for input(s): "VITAMINB12", "FOLATE", "FERRITIN", "TIBC", "IRON", "RETICCTPCT" in the last 72 hours. Urinalysis    Component Value Date/Time   COLORURINE YELLOW 04/28/2022 0121   APPEARANCEUR HAZY (A) 04/28/2022 0121   LABSPEC 1.038 (H) 04/28/2022 0121   PHURINE 5.0 04/28/2022 0121   GLUCOSEU NEGATIVE 04/28/2022 0121   HGBUR SMALL (A) 04/28/2022 0121   BILIRUBINUR NEGATIVE 04/28/2022 0121   KETONESUR 20 (A)  04/28/2022 0121   PROTEINUR NEGATIVE 04/28/2022 0121   UROBILINOGEN 0.2 03/13/2017 1905   NITRITE NEGATIVE 04/28/2022 0121   LEUKOCYTESUR NEGATIVE 04/28/2022 0121   Sepsis Labs Recent Labs  Lab 04/28/22 0432 04/29/22 0413 04/30/22 0237 05/01/22 0745  WBC 12.7* 11.4* 12.6* 13.9*   Microbiology No results found for this or any previous visit (from the past 240 hour(s)).   Time coordinating discharge: 25 minutes  SIGNED: Antonieta Pert, MD  [facility] 05/01/2022, 2:31 PM  If 7PM-7AM, please contact night-coverage [email or web address]

## 2022-05-02 ENCOUNTER — Telehealth: Payer: Self-pay

## 2022-05-02 NOTE — Telephone Encounter (Signed)
-----   Message from Ladene Artist, MD sent at 05/01/2022  2:10 PM EDT ----- Regarding: RE: make fup ov for pt We will arrange. Thanks.   ----- Message ----- From: Clearence Cheek Sent: 05/01/2022   1:42 PM EDT To: Ladene Artist, MD; Lindon Romp, CMA Subject: make fup ov for pt                             Hi.  This patient is can to go home today.  She had upper GI bleed from duodenal ulcers.  She has never had a colonoscopy and at her age should have screening study.  Wonder if you can arrange an office visit for follow-up of her ulcers and at that time arrange colonoscopy and may be repeat EGD if indicated.  I let her know that the office would be in touch with her.  Going home on Protonix 40 bid.  She is very pleasant  Thanks Judson Roch,

## 2022-05-02 NOTE — Telephone Encounter (Signed)
Scheduled appt with Dr. Fuller Plan for 07/05/22 at 10:50 am.

## 2022-05-16 IMAGING — DX DG CHEST 2V
2 series · 2 of 2 positions shown · non-contrast
Comparison: Chest x-ray dated May 22, 2007

CLINICAL DATA: Cough

EXAM:
CHEST - 2 VIEW

[chest pa]
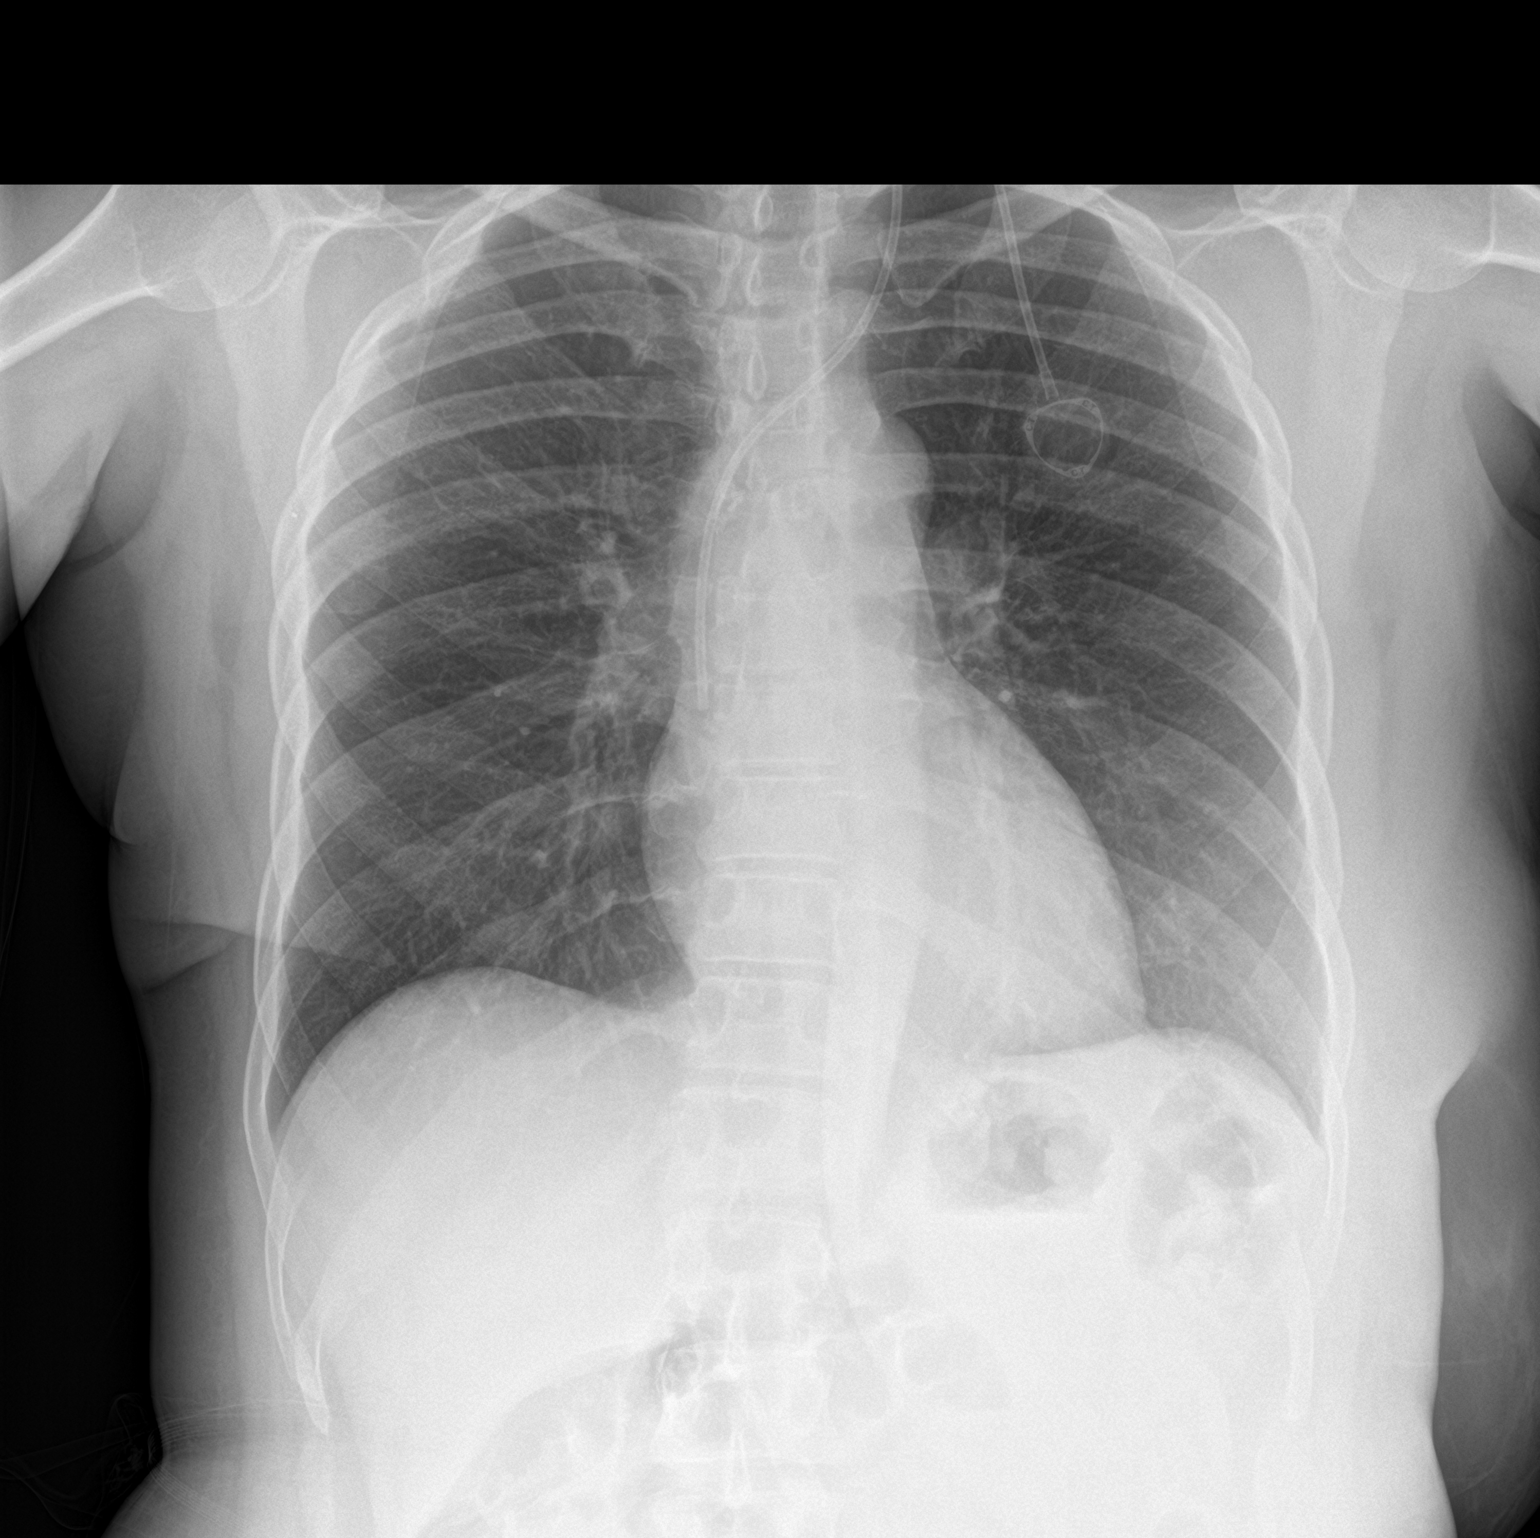

[chest lat]
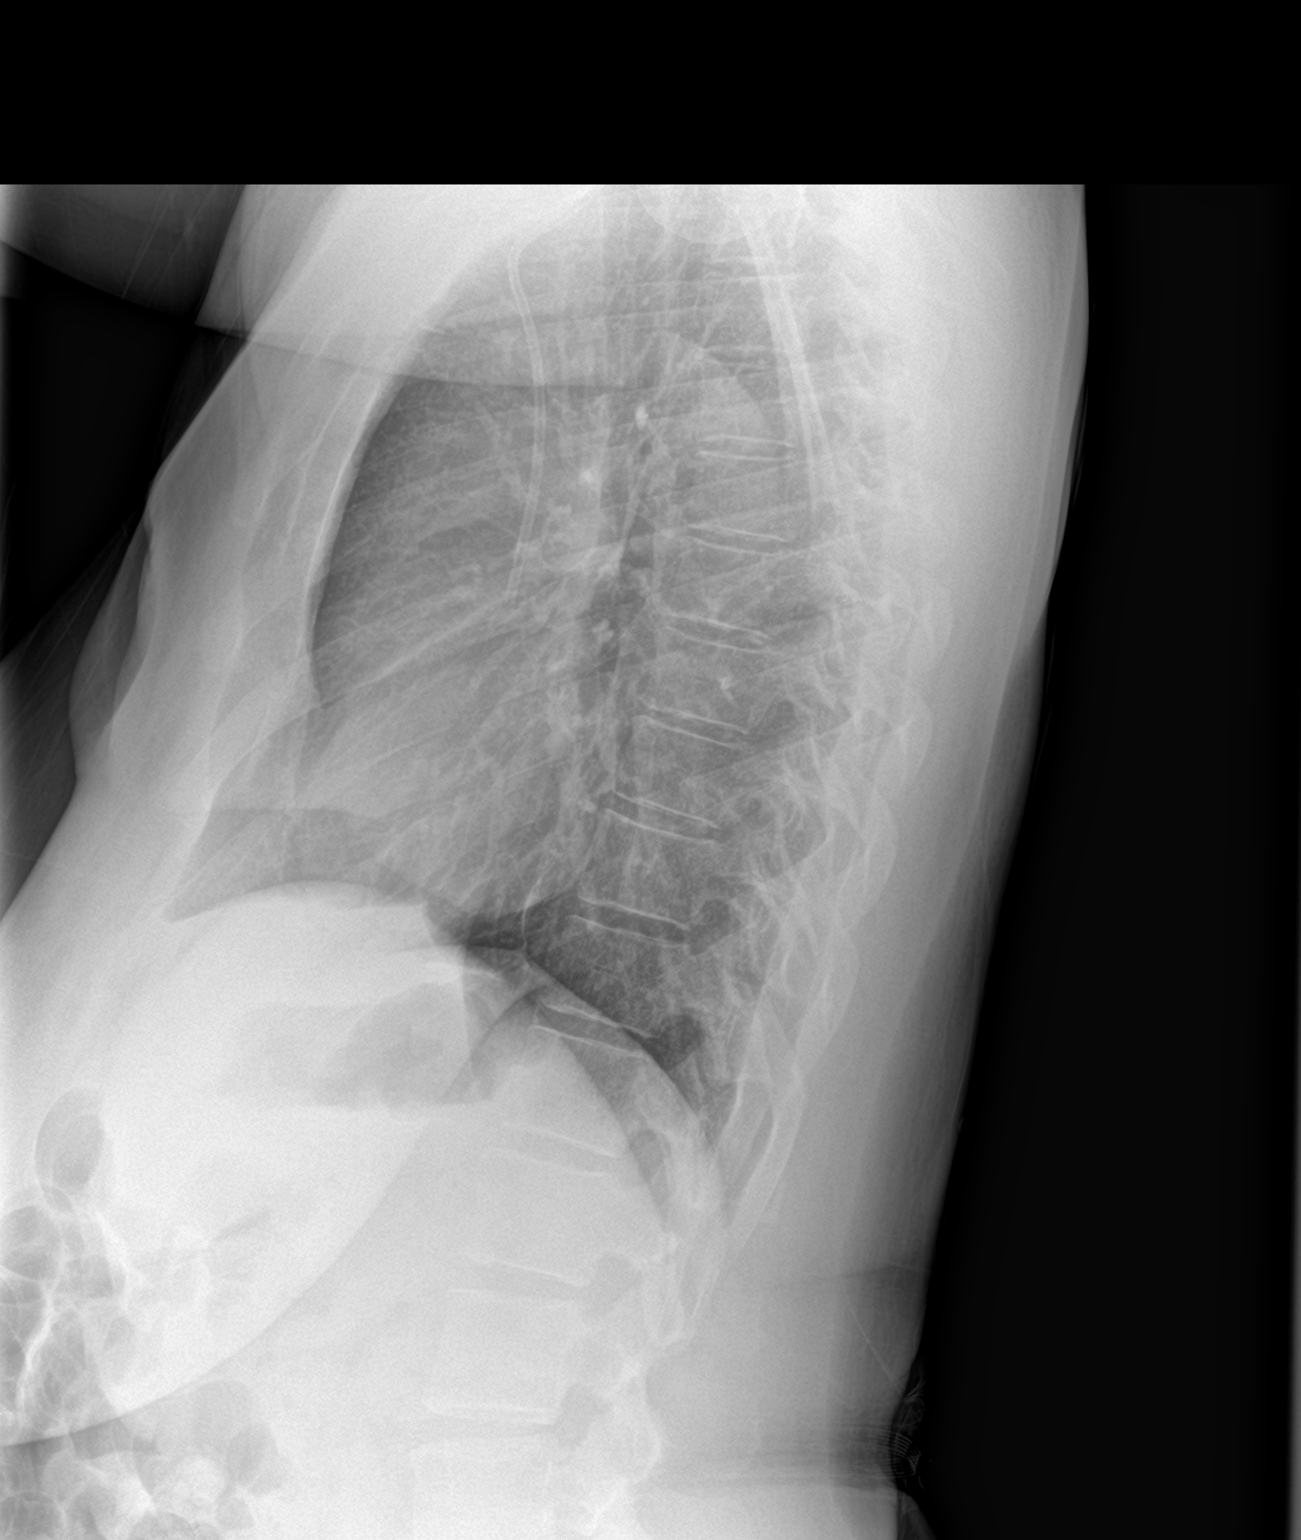

[2 of 2 positions shown; findings below may reference images not displayed]

FINDINGS: Left chest wall port with tip near the superior cavoatrial junction.
The heart size and mediastinal contours are within normal limits.
Both lungs are clear. The visualized skeletal structures are
unremarkable.
IMPRESSION: No active cardiopulmonary disease.

## 2022-06-09 ENCOUNTER — Telehealth: Payer: Self-pay | Admitting: Gastroenterology

## 2022-06-09 MED ORDER — PANTOPRAZOLE SODIUM 40 MG PO TBEC
40.0000 mg | DELAYED_RELEASE_TABLET | Freq: Two times a day (BID) | ORAL | 11 refills | Status: DC
Start: 2022-06-09 — End: 2022-07-05

## 2022-06-09 NOTE — Telephone Encounter (Signed)
Prescription sent to patients pharmacy. Patient informed prescription was sent.

## 2022-06-09 NOTE — Telephone Encounter (Signed)
Patient states she run out of her pantoprazole 40 mg and would really appreciated if a refill can be send to her pharmacy. Requesting a call to know if she will be able to to get the medication or not.

## 2022-07-05 ENCOUNTER — Other Ambulatory Visit (INDEPENDENT_AMBULATORY_CARE_PROVIDER_SITE_OTHER): Payer: 59

## 2022-07-05 ENCOUNTER — Other Ambulatory Visit: Payer: Self-pay

## 2022-07-05 ENCOUNTER — Encounter: Payer: Self-pay | Admitting: Gastroenterology

## 2022-07-05 ENCOUNTER — Ambulatory Visit (INDEPENDENT_AMBULATORY_CARE_PROVIDER_SITE_OTHER): Payer: 59 | Admitting: Gastroenterology

## 2022-07-05 VITALS — BP 130/82 | HR 93 | Ht 62.0 in | Wt 166.0 lb

## 2022-07-05 DIAGNOSIS — Z1211 Encounter for screening for malignant neoplasm of colon: Secondary | ICD-10-CM | POA: Diagnosis not present

## 2022-07-05 DIAGNOSIS — D62 Acute posthemorrhagic anemia: Secondary | ICD-10-CM

## 2022-07-05 DIAGNOSIS — Z171 Estrogen receptor negative status [ER-]: Secondary | ICD-10-CM

## 2022-07-05 DIAGNOSIS — K264 Chronic or unspecified duodenal ulcer with hemorrhage: Secondary | ICD-10-CM

## 2022-07-05 DIAGNOSIS — Z1212 Encounter for screening for malignant neoplasm of rectum: Secondary | ICD-10-CM | POA: Diagnosis not present

## 2022-07-05 LAB — CBC WITH DIFFERENTIAL/PLATELET
Basophils Absolute: 0 10*3/uL (ref 0.0–0.1)
Basophils Relative: 0.5 % (ref 0.0–3.0)
Eosinophils Absolute: 0.2 10*3/uL (ref 0.0–0.7)
Eosinophils Relative: 2.9 % (ref 0.0–5.0)
HCT: 29.4 % — ABNORMAL LOW (ref 36.0–46.0)
Hemoglobin: 9.3 g/dL — ABNORMAL LOW (ref 12.0–15.0)
Lymphocytes Relative: 28.9 % (ref 12.0–46.0)
Lymphs Abs: 2.1 10*3/uL (ref 0.7–4.0)
MCHC: 31.8 g/dL (ref 30.0–36.0)
MCV: 80.6 fl (ref 78.0–100.0)
Monocytes Absolute: 0.4 10*3/uL (ref 0.1–1.0)
Monocytes Relative: 5.8 % (ref 3.0–12.0)
Neutro Abs: 4.6 10*3/uL (ref 1.4–7.7)
Neutrophils Relative %: 61.9 % (ref 43.0–77.0)
Platelets: 346 10*3/uL (ref 150.0–400.0)
RBC: 3.65 Mil/uL — ABNORMAL LOW (ref 3.87–5.11)
RDW: 16 % — ABNORMAL HIGH (ref 11.5–15.5)
WBC: 7.4 10*3/uL (ref 4.0–10.5)

## 2022-07-05 MED ORDER — PANTOPRAZOLE SODIUM 40 MG PO TBEC
40.0000 mg | DELAYED_RELEASE_TABLET | Freq: Every day | ORAL | 11 refills | Status: DC
Start: 2022-07-05 — End: 2024-05-08

## 2022-07-05 MED ORDER — NA SULFATE-K SULFATE-MG SULF 17.5-3.13-1.6 GM/177ML PO SOLN: 1.0000 | Freq: Once | ORAL | 0 refills | Status: AC

## 2022-07-05 MED ORDER — FERROUS SULFATE 325 (65 FE) MG PO TABS: 325.0000 mg | ORAL_TABLET | Freq: Two times a day (BID) | ORAL | Status: DC

## 2022-07-05 NOTE — Progress Notes (Signed)
    Assessment     Duodenal ulcer with bleed in August, H. pylori negative GERD with LA grade B esophagitis, erosive gastropathy ABL anemia CRC screening, average risk   Recommendations    Schedule EGD to assess ulcer healing and colonoscopy for screening. The risks (including bleeding, perforation, infection, missed lesions, medication reactions and possible hospitalization or surgery if complications occur), benefits, and alternatives to colonoscopy with possible biopsy and possible polypectomy were discussed with the patient and they consent to proceed.  The risks (including bleeding, perforation, infection, missed lesions, medication reactions and possible hospitalization or surgery if complications occur), benefits, and alternatives to endoscopy with possible biopsy and possible dilation were discussed with the patient and they consent to proceed.   Change pantoprazole to 40 mg p.o. daily long-term, follow antireflux measures NSAID avoidance long-term CBC today   HPI    This is a 51 year old female who was hospitalized in August for an upper GI bleed and ABL anemia due to a duodenal ulcer with a visible vessel.  She is accompanied by her husband.  Her discharge hemoglobin was 9.2.  She has felt well since discharge without evidence of bleeding.  No gastrointestinal complaints.  She has not previously had a colonoscopy.   EGD Aug 2023 - LA Grade B reflux esophagitis with no bleeding. - Small hiatal hernia. - Erosive gastropathy with no bleeding and no stigmata of recent bleeding. Biopsied. - Non-bleeding duodenal ulcer with a nonbleeding visible vessel (Forrest Class IIa). Treated with bipolar cautery. - Non-bleeding duodenal ulcer with a clean ulcer base (Forrest Class III).   Labs / Imaging       Latest Ref Rng & Units 04/28/2022    4:32 AM 04/27/2022    2:16 PM 11/11/2021    1:28 PM  Hepatic Function  Total Protein 6.5 - 8.1 g/dL 5.8  6.9  7.1   Albumin 3.5 - 5.0 g/dL 2.8   3.2  3.9   AST 15 - 41 U/L _0 ALT 0 - 44 U/L _1 Alk Phosphatase 38 - 126 U/L 54  61  92   Total Bilirubin 0.3 - 1.2 mg/dL 0.4  0.3  0.2        Latest Ref Rng & Units 05/01/2022    7:45 AM 04/30/2022    7:17 PM 04/30/2022    2:37 AM  CBC  WBC 4.0 - 10.5 K/uL 13.9   12.6   Hemoglobin 12.0 - 15.0 g/dL 9.2  9.2  8.1   Hematocrit 36.0 - 46.0 % 28.3  28.7  25.3   Platelets 150 - 400 K/uL 409   341     Current Medications, Allergies, Past Medical History, Past Surgical History, Family History and Social History were reviewed in [employer] record.   Physical Exam: General: Well developed, well nourished, no acute distress Head: Normocephalic and atraumatic Eyes: Sclerae anicteric, EOMI Ears: Normal auditory acuity Mouth: Not examined Lungs: Clear throughout to auscultation Heart: Regular rate and rhythm; no murmurs, rubs or bruits Abdomen: Soft, non tender and non distended. No masses, hepatosplenomegaly or hernias noted. Normal Bowel sounds Rectal: Deferred to colonoscopy Musculoskeletal: Symmetrical with no gross deformities  Pulses:  Normal pulses noted Extremities: No clubbing, cyanosis, edema or deformities noted Neurological: Alert oriented x 4, grossly nonfocal Psychological:  Alert and cooperative. Normal mood and affect   Tayten Bergdoll T. Fuller Plan, MD 07/05/2022, 10:55 AM

## 2022-07-05 NOTE — Patient Instructions (Signed)
Your provider has requested that you go to the basement level for lab work before leaving today. Press "B" on the elevator. The lab is located at the first door on the left as you exit the elevator.  Decrease your pantoprazole 40 mg to once daily. A new prescription has been sen to your pharmacy.   You have been scheduled for an endoscopy and colonoscopy. Please follow the written instructions given to you at your visit today. Please pick up your prep supplies at the pharmacy within the next 1-3 days. If you use inhalers (even only as needed), please bring them with you on the day of your procedure.  The [facility] providers would like to encourage you to use [facility] to communicate with providers for non-urgent requests or questions.  Due to long hold times on the telephone, sending your provider a message by [facility] may be a faster and more efficient way to get a response.  Please allow 48 business hours for a response.  Please remember that this is for non-urgent requests.   Due to recent changes in healthcare laws, you may see the results of your imaging and laboratory studies on MyChart before your provider has had a chance to review them.  We understand that in some cases there may be results that are confusing or concerning to you. Not all laboratory results come back in the same time frame and the provider may be waiting for multiple results in order to interpret others.  Please give [place] 48 hours in order for your provider to thoroughly review all the results before contacting the office for clarification of your results.   Thank you for choosing me and [facility].  Pricilla Riffle. Dagoberto Ligas., MD., Marval Regal

## 2022-08-09 ENCOUNTER — Encounter: Payer: Self-pay | Admitting: Gastroenterology

## 2022-08-17 ENCOUNTER — Ambulatory Visit (AMBULATORY_SURGERY_CENTER): Payer: 59 | Admitting: Gastroenterology

## 2022-08-17 ENCOUNTER — Encounter: Payer: Self-pay | Admitting: Gastroenterology

## 2022-08-17 VITALS — BP 125/72 | HR 96 | Temp 97.3°F | Resp 15 | Ht 62.0 in | Wt 166.0 lb

## 2022-08-17 DIAGNOSIS — K449 Diaphragmatic hernia without obstruction or gangrene: Secondary | ICD-10-CM | POA: Diagnosis not present

## 2022-08-17 DIAGNOSIS — Z1212 Encounter for screening for malignant neoplasm of rectum: Secondary | ICD-10-CM | POA: Diagnosis not present

## 2022-08-17 DIAGNOSIS — Z8719 Personal history of other diseases of the digestive system: Secondary | ICD-10-CM

## 2022-08-17 DIAGNOSIS — Z1211 Encounter for screening for malignant neoplasm of colon: Secondary | ICD-10-CM

## 2022-08-17 DIAGNOSIS — K6389 Other specified diseases of intestine: Secondary | ICD-10-CM | POA: Diagnosis not present

## 2022-08-17 DIAGNOSIS — D62 Acute posthemorrhagic anemia: Secondary | ICD-10-CM

## 2022-08-17 DIAGNOSIS — D125 Benign neoplasm of sigmoid colon: Secondary | ICD-10-CM

## 2022-08-17 DIAGNOSIS — K264 Chronic or unspecified duodenal ulcer with hemorrhage: Secondary | ICD-10-CM

## 2022-08-17 MED ORDER — SODIUM CHLORIDE 0.9 % IV SOLN: 500.0000 mL | Freq: Once | INTRAVENOUS | Status: DC

## 2022-08-17 NOTE — Progress Notes (Signed)
Pt's states no medical or surgical changes since previsit or office visit. 

## 2022-08-17 NOTE — Progress Notes (Signed)
Called to room to assist during endoscopic procedure.  Patient ID and intended procedure confirmed with present staff. Received instructions for my participation in the procedure from the performing physician.  

## 2022-08-17 NOTE — Progress Notes (Signed)
History & Physical  Primary Care Physician:  [name], FNP Primary Gastroenterologist: Lucio Edward, MD  CHIEF COMPLAINT:  CRC screening, recent duodenal ulcer with bleeding  HPI: Penny Brown is a 51 y.o. female with a recent duodenal ulcer with bleed and colorectal cancer screening, average risk, for EGD and colonoscopy.   Past Medical History:  Diagnosis Date   Breast cancer [facility]) 2008   Right Breast Cancer   Breast cancer ([place]) 09/2020   Right Breast   Cancer [facility])    Family history of breast cancer    Family history of prostate cancer    Personal history of chemotherapy 2008   Right Breast Cancer   Personal history of radiation therapy 2008   Right Breast Cancer   Port-A-Cath in place 10/21/2020    Past Surgical History:  Procedure Laterality Date   BIOPSY  04/28/2022   Procedure: BIOPSY;  Surgeon: Ladene Artist, MD;  Location: [facility] ENDOSCOPY;  Service: Gastroenterology;;   BREAST LUMPECTOMY Right 2008   BREAST REDUCTION SURGERY Left 06/03/2021   Procedure: MAMMARY REDUCTION  (BREAST);  Surgeon: Irene Limbo, MD;  Location: [place];  Service: Plastics;  Laterality: Left;   CESAREAN SECTION     ESOPHAGOGASTRODUODENOSCOPY (EGD) WITH PROPOFOL N/A 04/28/2022   Procedure: ESOPHAGOGASTRODUODENOSCOPY (EGD) WITH PROPOFOL;  Surgeon: Ladene Artist, MD;  Location: [place];  Service: Gastroenterology;  Laterality: N/A;   HOT HEMOSTASIS N/A 04/28/2022   Procedure: HOT HEMOSTASIS (ARGON PLASMA COAGULATION/BICAP);  Surgeon: Ladene Artist, MD;  Location: [place];  Service: Gastroenterology;  Laterality: N/A;   IR CV LINE INJECTION  08/25/2021   MASTECTOMY W/ SENTINEL NODE BIOPSY Right 02/24/2021   Procedure: RIGHT MASTECTOMY WITH RIGHT AXILLARY SENTINEL LYMPH NODE BIOPSY;  Surgeon: [name], MD;  Location: [place];  Service: General;  Laterality: Right;   PORTACATH PLACEMENT Left 10/13/2020   Procedure: INSERTION PORT-A-CATH;   Surgeon: [name], MD;  Location: [place];  Service: General;  Laterality: Left;  LEAVE PORT ACCESSED CHEMO STARTS ON 2/10, LEFT SIDE    Prior to Admission medications   Medication Sig Start Date End Date Taking? Authorizing Provider  acetaminophen (TYLENOL) 500 MG tablet Take 500 mg by mouth every 4 (four) hours as needed (pain). Patient not taking: Reported on 07/05/2022    [provider]  albuterol (VENTOLIN HFA) 108 (90 Base) MCG/ACT inhaler Inhale 2 puffs into the lungs every 6 (six) hours as needed for wheezing or shortness of breath. Patient not taking: Reported on 08/17/2022 05/26/21   Gardenia Phlegm, NP  docusate sodium (COLACE) 100 MG capsule Take 100 mg by mouth 2 (two) times daily. Patient not taking: Reported on 08/17/2022    [provider]  doxycycline (VIBRAMYCIN) 100 MG capsule Take 100 mg by mouth 2 (two) times daily. Patient not taking: Reported on 04/28/2022 03/15/22   [provider]  ferrous sulfate 325 (65 FE) MG tablet Take 1 tablet (325 mg total) by mouth 2 (two) times daily with a meal. Patient not taking: Reported on 08/17/2022 07/05/22   Ladene Artist, MD  linaclotide [facility]) 72 MCG capsule Take 72 mcg by mouth daily as needed (constipation). Patient not taking: Reported on 08/17/2022    [provider]  pantoprazole (PROTONIX) 40 MG tablet Take 1 tablet (40 mg total) by mouth daily. 07/05/22 08/04/22  Ladene Artist, MD  prochlorperazine (COMPAZINE) 10 MG tablet Take 1 tablet (10 mg total) by mouth every 6 (six)  hours as needed (Nausea or vomiting). 09/30/20 01/13/21  Magrinat, [name], MD    Current Outpatient Medications  Medication Sig Dispense Refill   acetaminophen (TYLENOL) 500 MG tablet Take 500 mg by mouth every 4 (four) hours as needed (pain). (Patient not taking: Reported on 07/05/2022)     albuterol (VENTOLIN HFA) 108 (90 Base) MCG/ACT inhaler Inhale 2 puffs into the lungs every 6  (six) hours as needed for wheezing or shortness of breath. (Patient not taking: Reported on 08/17/2022) 8 g 2   docusate sodium (COLACE) 100 MG capsule Take 100 mg by mouth 2 (two) times daily. (Patient not taking: Reported on 08/17/2022)     doxycycline (VIBRAMYCIN) 100 MG capsule Take 100 mg by mouth 2 (two) times daily. (Patient not taking: Reported on 04/28/2022)     ferrous sulfate 325 (65 FE) MG tablet Take 1 tablet (325 mg total) by mouth 2 (two) times daily with a meal. (Patient not taking: Reported on 08/17/2022)     linaclotide (LINZESS) 72 MCG capsule Take 72 mcg by mouth daily as needed (constipation). (Patient not taking: Reported on 08/17/2022)     pantoprazole (PROTONIX) 40 MG tablet Take 1 tablet (40 mg total) by mouth daily. 30 tablet 11   Current Facility-Administered Medications  Medication Dose Route Frequency Provider Last Rate Last Admin   0.9 %  sodium chloride infusion  500 mL Intravenous Once Ladene Artist, MD        Allergies as of 08/17/2022   (No Known Allergies)    Family History  Problem Relation Age of Onset   Breast cancer Maternal Grandmother 78   Prostate cancer Maternal Grandfather        dx 70s, metastatic   Colon cancer Neg Hx    Stomach cancer Neg Hx    Esophageal cancer Neg Hx     Social History   Socioeconomic History   Marital status: Married    Spouse name: Not on file   Number of children: Not on file   Years of education: Not on file   Highest education level: Not on file  Occupational History   Not on file  Tobacco Use   Smoking status: Never   Smokeless tobacco: Never  Vaping Use   Vaping Use: Never used  Substance and Sexual Activity   Alcohol use: No   Drug use: No   Sexual activity: Not on file  Other Topics Concern   Not on file  Social History Narrative   Not on file   Social Determinants of Health   Financial Resource Strain: Low Risk  (09/29/2020)   Overall Financial Resource Strain (CARDIA)    Difficulty of  Paying Living Expenses: Not hard at all  Food Insecurity: Not on file  Transportation Needs: No Transportation Needs (09/29/2020)   PRAPARE - Hydrologist (Medical): No    Lack of Transportation (Non-Medical): No  Physical Activity: Not on file  Stress: Not on file  Social Connections: Not on file  Intimate Partner Violence: Not At Risk (01/18/2021)   Humiliation, Afraid, Rape, and Kick questionnaire    Fear of Current or Ex-Partner: No    Emotionally Abused: No    Physically Abused: No    Sexually Abused: No    Review of Systems:  All systems reviewed were negative except where noted in HPI.   Physical Exam: General:  Alert, well-developed, in NAD Head:  Normocephalic and atraumatic. Eyes:  Sclera clear, no icterus.  Conjunctiva pink. Ears:  Normal auditory acuity. Mouth:  No deformity or lesions.  Neck:  Supple; no masses . Lungs:  Clear throughout to auscultation.   No wheezes, crackles, or rhonchi. No acute distress. Heart:  Regular rate and rhythm; no murmurs. Abdomen:  Soft, nondistended, nontender. No masses, hepatomegaly. No obvious masses.  Normal bowel .    Rectal:  Deferred   Msk:  Symmetrical without gross deformities.. Pulses:  Normal pulses noted. Extremities:  Without edema. Neurologic:  Alert and  oriented x4;  grossly normal neurologically. Skin:  Intact without significant lesions or rashes. Psych:  Alert and cooperative. Normal mood and affect.  Impression / Brown:   Recent duodenal ulcer with bleed and colorectal cancer screening, average risk, for EGD and colonoscopy.  Penny Brown. Penny Brown  08/17/2022, 10:34 AM Penny Shea Evans, [facility], to contact our on call provider

## 2022-08-17 NOTE — Patient Instructions (Signed)
Handouts provided about polyps,  high fiber diet, diverticulosis, anti reflux measures and hiatal hernia.  Resume previous diet.  Follow antireflux measures.  Continue present medications.  Pantoprazole 40 mg oral everyday.    Patient states she has Pantoprazole at home.   GI office appt. In 1 year.  Follow up with PCP as planned.  Await pathology results, repeat colonoscopy after studies are complete for surveillance based on pathology results.    YOU HAD AN ENDOSCOPIC PROCEDURE TODAY AT [facility]:   Refer to the procedure report that was given to you for any specific questions about what was found during the examination.  If the procedure report does not answer your questions, please call your gastroenterologist to clarify.  If you requested that your care partner not be given the details of your procedure findings, then the procedure report has been included in a sealed envelope for you to review at your convenience later.  YOU SHOULD EXPECT: Some feelings of bloating in the abdomen. Passage of more gas than usual.  Walking can help get rid of the air that was put into your GI tract during the procedure and reduce the bloating. If you had a lower endoscopy (such as a colonoscopy or flexible sigmoidoscopy) you may notice spotting of blood in your stool or on the toilet paper. If you underwent a bowel prep for your procedure, you may not have a normal bowel movement for a few days.  Please Note:  You might notice some irritation and congestion in your nose or some drainage.  This is from the oxygen used during your procedure.  There is no need for concern and it should clear up in a day or so.  SYMPTOMS TO REPORT IMMEDIATELY:  Following lower endoscopy (colonoscopy or flexible sigmoidoscopy):  Excessive amounts of blood in the stool  Significant tenderness or worsening of abdominal pains  Swelling of the abdomen that is new, acute  Fever of 100F or higher  Following upper  endoscopy (EGD)  Vomiting of blood or coffee ground material  New chest pain or pain under the shoulder blades  Painful or persistently difficult swallowing  New shortness of breath  Fever of 100F or higher  Black, tarry-looking stools  For urgent or emergent issues, a gastroenterologist can be reached at any hour by calling [phone]. Do not use MyChart messaging for urgent concerns.    DIET:  We do recommend a small meal at first, but then you may proceed to your regular diet.  Drink plenty of fluids but you should avoid alcoholic beverages for 24 hours.  ACTIVITY:  You should plan to take it easy for the rest of today and you should NOT DRIVE or use heavy machinery until tomorrow (because of the sedation medicines used during the test).    FOLLOW UP: Our staff will call the number listed on your records the next business day following your procedure.  We will call around 7:15- 8:00 am to check on you and address any questions or concerns that you may have regarding the information given to you following your procedure. If we do not reach you, we will leave a message.     If any biopsies were taken you will be contacted by phone or by letter within the next 1-3 weeks.  Please call us at [phone] if you have not heard about the biopsies in 3 weeks.    SIGNATURES/CONFIDENTIALITY: You and/or your care partner have signed paperwork which will  be entered into your electronic medical record.  These signatures attest to the fact that that the information above on your After Visit Summary has been reviewed and is understood.  Full responsibility of the confidentiality of this discharge information lies with you and/or your care-partner. 

## 2022-08-17 NOTE — Op Note (Signed)
[place] Patient Name: Penny Brown Procedure Date: 08/17/2022 10:30 AM MRN: [id number] Endoscopist: Ladene Artist , MD, [id number] Age: 51 Referring MD:  Date of Birth: 02/04/1971 Gender: Female Account #: [id number] Procedure:                Upper GI endoscopy Indications:              Follow-up of acute duodenal ulcer with hemorrhage Medicines:                Monitored Anesthesia Care Procedure:                Pre-Anesthesia Assessment:                           - Prior to the procedure, a History and Physical                            was performed, and patient medications and                            allergies were reviewed. The patient's tolerance of                            previous anesthesia was also reviewed. The risks                            and benefits of the procedure and the sedation                            options and risks were discussed with the patient.                            All questions were answered, and informed consent                            was obtained. Prior Anticoagulants: The patient has                            taken no anticoagulant or antiplatelet agents. ASA                            Grade Assessment: II - A patient with mild systemic                            disease. After reviewing the risks and benefits,                            the patient was deemed in satisfactory condition to                            undergo the procedure.                           After obtaining informed consent, the endoscope was  passed under direct vision. Throughout the                            procedure, the patient's blood pressure, pulse, and                            oxygen saturations were monitored continuously. The                            GIF [id number] was introduced through the                            mouth, and advanced to the second part of duodenum.                             The upper GI endoscopy was accomplished without                            difficulty. The patient tolerated the procedure                            well. Scope In: Scope Out: Findings:                 The examined esophagus was normal.                           A small hiatal hernia was present.                           The exam of the stomach was otherwise normal.                           The duodenal bulb and second portion of the                            duodenum were normal. Complications:            No immediate complications. Estimated Blood Loss:     Estimated blood loss: none. Impression:               - Normal esophagus.                           - Small hiatal hernia.                           - Normal duodenal bulb and second portion of the                            duodenum.                           - No specimens collected. Recommendation:           - Patient has a contact number available for  emergencies. The signs and symptoms of potential                            delayed complications were discussed with the                            patient. Return to normal activities tomorrow.                            Written discharge instructions were provided to the                            patient.                           - Resume previous diet.                           - Follow antireflux measures.                           - Continue present medications.                           - Pantoprazole 40 mg po qd.                           - GI office appt in 1 year.                           - Follow up with PCP as planned. Ladene Artist, MD 08/17/2022 11:16:20 AM This report has been signed electronically.

## 2022-08-17 NOTE — Progress Notes (Signed)
Pt resting comfortably. VSS. Airway intact. SBAR complete to RN. All questions answered.   

## 2022-08-17 NOTE — Op Note (Signed)
[facility] Patient Name: Penny Brown Procedure Date: 08/17/2022 10:37 AM MRN: [id number] Endoscopist: Ladene Artist , MD, [id number] Age: 51 Referring MD:  Date of Birth: 1971-06-09 Gender: Female Account #: [id number] Procedure:                Colonoscopy Indications:              Screening for colorectal malignant neoplasm Medicines:                Monitored Anesthesia Care Procedure:                Pre-Anesthesia Assessment:                           - Prior to the procedure, a History and Physical                            was performed, and patient medications and                            allergies were reviewed. The patient's tolerance of                            previous anesthesia was also reviewed. The risks                            and benefits of the procedure and the sedation                            options and risks were discussed with the patient.                            All questions were answered, and informed consent                            was obtained. Prior Anticoagulants: The patient has                            taken no anticoagulant or antiplatelet agents. ASA                            Grade Assessment: II - A patient with mild systemic                            disease. After reviewing the risks and benefits,                            the patient was deemed in satisfactory condition to                            undergo the procedure.                           After obtaining informed consent, the colonoscope  was passed under direct vision. Throughout the                            procedure, the patient's blood pressure, pulse, and                            oxygen saturations were monitored continuously. The                            CF [id number] #[id number] was introduced through the anus                            and advanced to the the cecum, identified by                            appendiceal  orifice and ileocecal valve. The                            ileocecal valve, appendiceal orifice, and rectum                            were photographed. The quality of the bowel                            preparation was good. The colonoscopy was performed                            without difficulty. The patient tolerated the                            procedure well. Scope In: 10:45:35 AM Scope Out: 10:59:15 AM Scope Withdrawal Time: 0 hours 11 minutes 2 seconds  Total Procedure Duration: 0 hours 13 minutes 40 seconds  Findings:                 The perianal and digital rectal examinations were                            normal.                           Two sessile polyps were found in the sigmoid colon.                            The polyps were 6 mm in size. These polyps were                            removed with a cold snare. Resection and retrieval                            were complete.                           Multiple small-mouthed diverticula were found in  the left colon. There was no evidence of                            diverticular bleeding.                           The exam was otherwise without abnormality on                            direct and retroflexion views. Complications:            No immediate complications. Estimated blood loss:                            None. Estimated Blood Loss:     Estimated blood loss: none. Impression:               - Two 6 mm polyps in the sigmoid colon, removed                            with a cold snare. Resected and retrieved.                           - Mild diverticulosis in the left colon.                           - The examination was otherwise normal on direct                            and retroflexion views. Recommendation:           - Repeat colonoscopy after studies are complete for                            surveillance based on pathology results.                           - Patient  has a contact number available for                            emergencies. The signs and symptoms of potential                            delayed complications were discussed with the                            patient. Return to normal activities tomorrow.                            Written discharge instructions were provided to the                            patient.                           - High fiber diet.                           -  Continue present medications.                           - Await pathology results. Ladene Artist, MD 08/17/2022 11:12:35 AM This report has been signed electronically.

## 2022-08-18 ENCOUNTER — Telehealth: Payer: Self-pay | Admitting: *Deleted

## 2022-08-18 NOTE — Telephone Encounter (Signed)
  Follow up Call-     08/17/2022   10:13 AM  Call back number  Post procedure Call Back phone  # [phone]  Permission to leave phone message Yes     Patient questions:  Do you have a fever, pain , or abdominal swelling? No. Pain Score  0 *  Have you tolerated food without any problems? Yes.    Have you been able to return to your normal activities? Yes.    Do you have any questions about your discharge instructions: Diet   No. Medications  No. Follow up visit  No.  Do you have questions or concerns about your Care? No.  Actions: * If pain score is 4 or above: No action needed, pain <4.

## 2022-09-05 ENCOUNTER — Encounter: Payer: Self-pay | Admitting: Gastroenterology

## 2022-09-11 ENCOUNTER — Telehealth: Payer: Self-pay

## 2022-09-11 NOTE — Telephone Encounter (Signed)
Called to remind patient to come in for lab work (2 month follow up CBC). She would prefer to have labs drawn at PCP office if able. Orders have been faxed to PCP office & requested that results be sent to Dr. Fuller Plan at [phone]. She's been advised to contact their office to arrange a possible lab appointment, and if needed she can always come here for labs with no appointment necessary. Pt verbalized all understanding.

## 2022-11-03 ENCOUNTER — Telehealth: Payer: Self-pay

## 2022-11-03 DIAGNOSIS — C50411 Malignant neoplasm of upper-outer quadrant of right female breast: Secondary | ICD-10-CM

## 2022-11-03 NOTE — Telephone Encounter (Signed)
Returned Pt's call regarding medical question. Pt states her current PCP is leaving the practice and her mammogram is past due. Pt asking if we can order mammogram to be done with GI. Order for MM screening of left breast placed and Pt notified. Pt verbalized understanding.

## 2022-12-21 ENCOUNTER — Ambulatory Visit
Admission: RE | Admit: 2022-12-21 | Discharge: 2022-12-21 | Disposition: A | Discharge status: Home / Self Care | Payer: 59 | Source: Ambulatory Visit | Attending: Hematology and Oncology | Admitting: Hematology and Oncology

## 2022-12-21 DIAGNOSIS — Z171 Estrogen receptor negative status [ER-]: Secondary | ICD-10-CM

## 2022-12-26 ENCOUNTER — Other Ambulatory Visit: Payer: Self-pay | Admitting: Hematology and Oncology

## 2022-12-26 DIAGNOSIS — R928 Other abnormal and inconclusive findings on diagnostic imaging of breast: Secondary | ICD-10-CM

## 2022-12-28 ENCOUNTER — Telehealth: Payer: Self-pay | Admitting: Hematology and Oncology

## 2022-12-28 NOTE — Telephone Encounter (Signed)
Spoke with patient confirming upcoming appointment  

## 2023-01-08 ENCOUNTER — Other Ambulatory Visit: Payer: Self-pay | Admitting: Hematology and Oncology

## 2023-01-08 ENCOUNTER — Ambulatory Visit
Admission: RE | Admit: 2023-01-08 | Discharge: 2023-01-08 | Disposition: A | Discharge status: Home / Self Care | Payer: 59 | Source: Ambulatory Visit | Attending: Hematology and Oncology | Admitting: Hematology and Oncology

## 2023-01-08 DIAGNOSIS — R928 Other abnormal and inconclusive findings on diagnostic imaging of breast: Secondary | ICD-10-CM

## 2023-01-08 DIAGNOSIS — R599 Enlarged lymph nodes, unspecified: Secondary | ICD-10-CM

## 2023-01-09 ENCOUNTER — Telehealth: Payer: Self-pay | Admitting: Hematology and Oncology

## 2023-01-11 ENCOUNTER — Inpatient Hospital Stay: Payer: 59 | Admitting: Hematology and Oncology

## 2023-01-12 ENCOUNTER — Ambulatory Visit
Admission: RE | Admit: 2023-01-12 | Discharge: 2023-01-12 | Disposition: A | Discharge status: Home / Self Care | Payer: 59 | Source: Ambulatory Visit | Attending: Hematology and Oncology | Admitting: Hematology and Oncology

## 2023-01-12 ENCOUNTER — Other Ambulatory Visit (INDEPENDENT_AMBULATORY_CARE_PROVIDER_SITE_OTHER): Payer: 59

## 2023-01-12 DIAGNOSIS — D62 Acute posthemorrhagic anemia: Secondary | ICD-10-CM | POA: Diagnosis not present

## 2023-01-12 DIAGNOSIS — C50211 Malignant neoplasm of upper-inner quadrant of right female breast: Secondary | ICD-10-CM | POA: Diagnosis not present

## 2023-01-12 DIAGNOSIS — R599 Enlarged lymph nodes, unspecified: Secondary | ICD-10-CM

## 2023-01-12 DIAGNOSIS — Z171 Estrogen receptor negative status [ER-]: Secondary | ICD-10-CM

## 2023-01-12 DIAGNOSIS — K264 Chronic or unspecified duodenal ulcer with hemorrhage: Secondary | ICD-10-CM | POA: Diagnosis not present

## 2023-01-12 DIAGNOSIS — R928 Other abnormal and inconclusive findings on diagnostic imaging of breast: Secondary | ICD-10-CM

## 2023-01-12 LAB — CBC WITH DIFFERENTIAL/PLATELET
Basophils Absolute: 0 10*3/uL (ref 0.0–0.1)
Basophils Relative: 0.6 % (ref 0.0–3.0)
Eosinophils Absolute: 0.3 10*3/uL (ref 0.0–0.7)
Eosinophils Relative: 4.5 % (ref 0.0–5.0)
HCT: 34.4 % — ABNORMAL LOW (ref 36.0–46.0)
Hemoglobin: 10.9 g/dL — ABNORMAL LOW (ref 12.0–15.0)
Lymphocytes Relative: 27.5 % (ref 12.0–46.0)
Lymphs Abs: 2 10*3/uL (ref 0.7–4.0)
MCHC: 31.7 g/dL (ref 30.0–36.0)
MCV: 83.8 fl (ref 78.0–100.0)
Monocytes Absolute: 0.4 10*3/uL (ref 0.1–1.0)
Monocytes Relative: 5.2 % (ref 3.0–12.0)
Neutro Abs: 4.4 10*3/uL (ref 1.4–7.7)
Neutrophils Relative %: 62.2 % (ref 43.0–77.0)
Platelets: 271 10*3/uL (ref 150.0–400.0)
RBC: 4.11 Mil/uL (ref 3.87–5.11)
RDW: 17.4 % — ABNORMAL HIGH (ref 11.5–15.5)
WBC: 7.1 10*3/uL (ref 4.0–10.5)

## 2023-01-12 LAB — TSH: TSH: 2.48 u[IU]/mL (ref 0.35–5.50)

## 2023-01-12 LAB — COMPREHENSIVE METABOLIC PANEL
ALT: 8 U/L (ref 0–35)
AST: 14 U/L (ref 0–37)
Albumin: 3.9 g/dL (ref 3.5–5.2)
Alkaline Phosphatase: 92 U/L (ref 39–117)
BUN: 7 mg/dL (ref 6–23)
CO2: 23 mEq/L (ref 19–32)
Calcium: 8.8 mg/dL (ref 8.4–10.5)
Chloride: 104 mEq/L (ref 96–112)
Creatinine, Ser: 0.67 mg/dL (ref 0.40–1.20)
GFR: 100.3 mL/min (ref >60.00)
Glucose, Bld: 113 mg/dL — ABNORMAL HIGH (ref 70–99)
Potassium: 3.5 mEq/L (ref 3.5–5.1)
Sodium: 137 mEq/L (ref 135–145)
Total Bilirubin: 0.2 mg/dL (ref 0.2–1.2)
Total Protein: 7.1 g/dL (ref 6.0–8.3)

## 2023-01-15 ENCOUNTER — Other Ambulatory Visit: Payer: Self-pay

## 2023-01-15 MED ORDER — FERROUS SULFATE 325 (65 FE) MG PO TABS: 325.0000 mg | ORAL_TABLET | Freq: Every day | ORAL | 3 refills | Status: DC

## 2023-02-02 ENCOUNTER — Telehealth: Payer: Self-pay | Admitting: *Deleted

## 2023-02-02 NOTE — Telephone Encounter (Signed)
Pt left VM on nurse line stating need to reschedule her routine follow up on 02/07/2023 due to conflict with plastic surgeon in [place] appt.  Please reschedule to next available with MD- yearly follow up request sent to scheduling.

## 2023-02-03 ENCOUNTER — Telehealth: Payer: Self-pay | Admitting: Hematology and Oncology

## 2023-02-03 NOTE — Telephone Encounter (Signed)
Called patient to get her rescheduled. She stated she will call back the following week after 6/5

## 2023-02-07 ENCOUNTER — Inpatient Hospital Stay: Payer: 59 | Admitting: Hematology and Oncology

## 2023-03-14 ENCOUNTER — Encounter: Payer: Self-pay | Admitting: Family Medicine

## 2023-03-14 ENCOUNTER — Ambulatory Visit (INDEPENDENT_AMBULATORY_CARE_PROVIDER_SITE_OTHER): Payer: 59 | Admitting: Family Medicine

## 2023-03-14 VITALS — BP 124/80 | HR 107 | Temp 98.3°F | Ht 62.0 in | Wt 162.0 lb

## 2023-03-14 DIAGNOSIS — Z8616 Personal history of COVID-19: Secondary | ICD-10-CM

## 2023-03-14 DIAGNOSIS — R053 Chronic cough: Secondary | ICD-10-CM | POA: Diagnosis not present

## 2023-03-14 DIAGNOSIS — J3089 Other allergic rhinitis: Secondary | ICD-10-CM | POA: Diagnosis not present

## 2023-03-14 DIAGNOSIS — K219 Gastro-esophageal reflux disease without esophagitis: Secondary | ICD-10-CM | POA: Diagnosis not present

## 2023-03-14 MED ORDER — LINACLOTIDE 72 MCG PO CAPS: 72.0000 ug | ORAL_CAPSULE | Freq: Every day | ORAL | 2 refills | Status: AC | PRN

## 2023-03-14 NOTE — Progress Notes (Signed)
New Patient Office Visit  Subjective    Patient ID: Penny Brown, female    DOB: [birth year]  Age: 52 y.o. MRN: [id number]  CC:  Chief Complaint  Patient presents with   Follow-up    No concerns    HPI Penny Brown presents to establish care Previous medical care with oncologist and GI   Here with her spouse.   Breast cancer survivor. No longer in treatment. Dr. Al Pimple is her new oncologist.   States since having Covid last year she has been dealing with chest congestion and cough. Using albuterol twice weekly. Denies hx of asthma or COPD. Never smoked.    Allergies and is not currently treating them.     Outpatient Encounter Medications as of 03/14/2023  Medication Sig   acetaminophen (TYLENOL) 500 MG tablet Take 500 mg by mouth every 4 (four) hours as needed (pain).   albuterol (VENTOLIN HFA) 108 (90 Base) MCG/ACT inhaler Inhale 2 puffs into the lungs every 6 (six) hours as needed for wheezing or shortness of breath.   docusate sodium (COLACE) 100 MG capsule Take 100 mg by mouth 2 (two) times daily.   ferrous sulfate 325 (65 FE) MG tablet Take 1 tablet (325 mg total) by mouth daily with breakfast.   [DISCONTINUED] doxycycline (VIBRAMYCIN) 100 MG capsule Take 100 mg by mouth 2 (two) times daily.   [DISCONTINUED] linaclotide (LINZESS) 72 MCG capsule Take 72 mcg by mouth daily as needed (constipation).   linaclotide (LINZESS) 72 MCG capsule Take 1 capsule (72 mcg total) by mouth daily as needed (constipation).   pantoprazole (PROTONIX) 40 MG tablet Take 1 tablet (40 mg total) by mouth daily.   [DISCONTINUED] prochlorperazine (COMPAZINE) 10 MG tablet Take 1 tablet (10 mg total) by mouth every 6 (six) hours as needed (Nausea or vomiting).   No facility-administered encounter medications on file as of 03/14/2023.    Past Medical History:  Diagnosis Date   Breast cancer (HCC) 2008   Right Breast Cancer   Breast cancer (HCC) 09/2020   Right Breast   Cancer [facility])     Family history of breast cancer    Family history of prostate cancer    Personal history of chemotherapy 2008   Right Breast Cancer   Personal history of radiation therapy 2008   Right Breast Cancer   Port-A-Cath in place 10/21/2020    Past Surgical History:  Procedure Laterality Date   BIOPSY  04/28/2022   Procedure: BIOPSY;  Surgeon: Meryl Dare, MD;  Location: [facility] ENDOSCOPY;  Service: Gastroenterology;;   BREAST LUMPECTOMY Right 2008   BREAST REDUCTION SURGERY Left 06/03/2021   Procedure: MAMMARY REDUCTION  (BREAST);  Surgeon: Glenna Fellows, MD;  Location: [facility];  Service: Plastics;  Laterality: Left;   CESAREAN SECTION     ESOPHAGOGASTRODUODENOSCOPY (EGD) WITH PROPOFOL N/A 04/28/2022   Procedure: ESOPHAGOGASTRODUODENOSCOPY (EGD) WITH PROPOFOL;  Surgeon: Meryl Dare, MD;  Location: [facility] ENDOSCOPY;  Service: Gastroenterology;  Laterality: N/A;   HOT HEMOSTASIS N/A 04/28/2022   Procedure: HOT HEMOSTASIS (ARGON PLASMA COAGULATION/BICAP);  Surgeon: Meryl Dare, MD;  Location: [facility] ENDOSCOPY;  Service: Gastroenterology;  Laterality: N/A;   IR CV LINE INJECTION  08/25/2021   MASTECTOMY Right 05/2021   MASTECTOMY W/ SENTINEL NODE BIOPSY Right 02/24/2021   Procedure: RIGHT MASTECTOMY WITH RIGHT AXILLARY SENTINEL LYMPH NODE BIOPSY;  Surgeon: Emelia Loron, MD;  Location: [facility] OR;  Service: General;  Laterality: Right;   PORTACATH PLACEMENT Left 10/13/2020  Procedure: INSERTION PORT-A-CATH;  Surgeon: Emelia Loron, MD;  Location: [facility];  Service: General;  Laterality: Left;  LEAVE PORT ACCESSED CHEMO STARTS ON 2/10, LEFT SIDE   REDUCTION MAMMAPLASTY Left 06/2021    Family History  Problem Relation Age of Onset   Breast cancer Maternal Grandmother 78   Prostate cancer Maternal Grandfather        dx 70s, metastatic   Colon cancer Neg Hx    Stomach cancer Neg Hx    Esophageal cancer Neg Hx     Social History    Socioeconomic History   Marital status: Married    Spouse name: Not on file   Number of children: Not on file   Years of education: Not on file   Highest education level: Not on file  Occupational History   Not on file  Tobacco Use   Smoking status: Never   Smokeless tobacco: Never  Vaping Use   Vaping Use: Never used  Substance and Sexual Activity   Alcohol use: No   Drug use: No   Sexual activity: Not on file  Other Topics Concern   Not on file  Social History Narrative   Not on file   Social Determinants of Health   Financial Resource Strain: Low Risk  (09/29/2020)   Overall Financial Resource Strain (CARDIA)    Difficulty of Paying Living Expenses: Not hard at all  Food Insecurity: Not on file  Transportation Needs: No Transportation Needs (09/29/2020)   PRAPARE - Administrator, Civil Service (Medical): No    Lack of Transportation (Non-Medical): No  Physical Activity: Not on file  Stress: Not on file  Social Connections: Not on file  Intimate Partner Violence: Not At Risk (01/18/2021)   Humiliation, Afraid, Rape, and Kick questionnaire    Fear of Current or Ex-Partner: No    Emotionally Abused: No    Physically Abused: No    Sexually Abused: No    Review of Systems  Constitutional:  Negative for chills and fever.  HENT:  Positive for congestion.   Respiratory:  Positive for cough. Negative for shortness of breath.   Cardiovascular:  Negative for chest pain, palpitations and leg swelling.  Gastrointestinal:  Negative for abdominal pain, constipation, diarrhea, nausea and vomiting.  Genitourinary:  Negative for dysuria, frequency and urgency.  Neurological:  Negative for dizziness, sensory change, focal weakness and headaches.        Objective    BP 124/80 (BP Location: Left Arm, Patient Position: Sitting, Cuff Size: Normal)   Pulse (!) 107   Temp 98.3 F (36.8 C) (Oral)   Ht 5\' 2"  (1.575 m)   Wt 162 lb (73.5 kg)   SpO2 99%   BMI 29.63  kg/m   Physical Exam Constitutional:      General: She is not in acute distress.    Appearance: She is not ill-appearing.  HENT:     Nose: Congestion present.  Eyes:     Extraocular Movements: Extraocular movements intact.     Conjunctiva/sclera: Conjunctivae normal.  Cardiovascular:     Rate and Rhythm: Normal rate and regular rhythm.  Pulmonary:     Effort: Pulmonary effort is normal.     Breath sounds: Normal breath sounds.  Musculoskeletal:     Cervical back: Normal range of motion and neck supple. No tenderness.  Lymphadenopathy:     Cervical: No cervical adenopathy.  Skin:    General: Skin is warm and dry.  Neurological:  General: No focal deficit present.     Mental Status: She is alert and oriented to person, place, and time.     Motor: No weakness.     Gait: Gait normal.  Psychiatric:        Mood and Affect: Mood normal.        Behavior: Behavior normal.        Thought Content: Thought content normal.         Assessment & Plan:   Problem List Items Addressed This Visit   None Visit Diagnoses     Environmental and seasonal allergies    -  Primary   Gastroesophageal reflux disease, unspecified whether esophagitis present       Relevant Medications   linaclotide (LINZESS) 72 MCG capsule   History of COVID-19       Relevant Orders   Ambulatory referral to Pulmonology   Chronic cough       Relevant Orders   Ambulatory referral to Pulmonology      Followed by GI. Refilled Linzess.  Start Xyzal or Zyrtec and Flonase.  Referral to pulmonologist for evaluation of persistent cough, congestion and needing albuterol since Covid infection last year.  Follow up here prn  Return if symptoms worsen or fail to improve.   Hetty Blend, NP-C

## 2023-03-14 NOTE — Patient Instructions (Addendum)
Try Xyzal or Zyrtec.   Try Flonase nasal spray.   I have referred you to [facility] for further evaluation of cough and chest congestion.   Continue albuterol as needed for now.    Please call and schedule a visit with an OB/GYN office.   [facility] [address] [place], [place] [phone]  [facility] [address] [place], [place] [address] [phone]  [facility] [address] [place], [place] [address] Phone: [phone]  [facility] Address: [address] [place], [place] [address] Phone: [phone]

## 2023-05-17 ENCOUNTER — Telehealth: Payer: Self-pay

## 2023-05-17 NOTE — Telephone Encounter (Signed)
Pt LVM to schedule a follow-up visit with Dr. Al Pimple. RN sent scheduling a message and called pt back to inform her that scheduling will be reaching out to her. Pt verbalized understanding.

## 2023-06-19 ENCOUNTER — Other Ambulatory Visit: Payer: Self-pay

## 2023-06-19 ENCOUNTER — Inpatient Hospital Stay: Payer: 59 | Attending: Hematology and Oncology | Admitting: Hematology and Oncology

## 2023-06-19 VITALS — BP 151/80 | HR 104 | Temp 97.5°F | Resp 18 | Wt 164.6 lb

## 2023-06-19 DIAGNOSIS — Z853 Personal history of malignant neoplasm of breast: Secondary | ICD-10-CM | POA: Insufficient documentation

## 2023-06-19 DIAGNOSIS — Z8042 Family history of malignant neoplasm of prostate: Secondary | ICD-10-CM | POA: Diagnosis not present

## 2023-06-19 DIAGNOSIS — Z923 Personal history of irradiation: Secondary | ICD-10-CM | POA: Diagnosis not present

## 2023-06-19 DIAGNOSIS — Z9221 Personal history of antineoplastic chemotherapy: Secondary | ICD-10-CM | POA: Diagnosis not present

## 2023-06-19 DIAGNOSIS — Z79899 Other long term (current) drug therapy: Secondary | ICD-10-CM | POA: Insufficient documentation

## 2023-06-19 DIAGNOSIS — Z171 Estrogen receptor negative status [ER-]: Secondary | ICD-10-CM

## 2023-06-19 DIAGNOSIS — Z803 Family history of malignant neoplasm of breast: Secondary | ICD-10-CM | POA: Diagnosis not present

## 2023-06-19 DIAGNOSIS — Z9011 Acquired absence of right breast and nipple: Secondary | ICD-10-CM | POA: Insufficient documentation

## 2023-06-19 DIAGNOSIS — C50411 Malignant neoplasm of upper-outer quadrant of right female breast: Secondary | ICD-10-CM | POA: Diagnosis not present

## 2023-06-19 MED ORDER — BETAMETHASONE VALERATE 0.1 % EX OINT: 1.0000 | TOPICAL_OINTMENT | Freq: Two times a day (BID) | CUTANEOUS | 0 refills | Status: DC

## 2023-06-19 NOTE — Progress Notes (Unsigned)
[facility]  [phone] [phone]    ID: Penny Brown DOB: 1971-05-07  MR#: [id number]  PPI#:[id number]  Patient Care Team: Penny Cocker, FNP as PCP - General (Family Medicine) Penny Loron, MD as Consulting Physician (General Surgery) Penny Puffer, MD as Consulting Physician (Radiation Oncology) Penny Moulds, MD as Consulting Physician (Hematology and Oncology) Penny Kocher, MD as Referring Physician (Plastic Surgery) Penny Moulds, MD OTHER MD:  CHIEF COMPLAINT: recurrent vs. new triple negative breast cancer (s/p right mastectomy)  CURRENT TREATMENT: Continuing pembrolizumab  INTERVAL HISTORY:  She is now here for follow-up since she completed adjuvant immunotherapy.  She is finally recovering from all the side effects.  Since her last visit here she had some reconstruction surgery, so far satisfied with the cosmetic outcome, she does not want to have her nipple tattooed at this moment.  She complains of a skin rash more like an eczema which she has from time to time, this rash is more prominent on the chest wall, she has used some topical steroids which have not provided much relief.  She had a virtual visit with dermatology and going to see them in person soon.  Other than that she denies any change in breathing, bowel habits, urinary habits.  No new headaches, blurred vision, falls etc. Rest of the pertinent 10 point ROS reviewed and negative.  REVIEW OF SYSTEMS: Review of Systems  Constitutional:  Positive for fatigue. Negative for appetite change, chills, fever and unexpected weight change.  HENT:   Negative for hearing loss, lump/mass and trouble swallowing.   Eyes:  Negative for eye problems and icterus.  Respiratory:  Negative for chest tightness, cough and shortness of breath.   Cardiovascular:  Negative for chest pain, leg swelling and palpitations.  Gastrointestinal:  Negative for abdominal distention, abdominal  pain, constipation, diarrhea, nausea and vomiting.  Endocrine: Negative for hot flashes.  Genitourinary:  Negative for difficulty urinating.   Musculoskeletal:  Negative for arthralgias.  Skin:  Negative for itching, rash and wound.  Neurological:  Negative for dizziness, extremity weakness, headaches and numbness.  Hematological:  Negative for adenopathy. Does not bruise/bleed easily.  Psychiatric/Behavioral:  Negative for depression. The patient is not nervous/anxious.      COVID 19 VACCINATION STATUS: fully vaccinated [employer]), with booster 06/2020   HISTORY OF CURRENT ILLNESS: From the original intake note:  Penny Brown has a history of 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 She received 4 cycles of doxorubicin and cyclophosphamide followed by 4 cycles of of paclitaxel, which was discontinued secondary to neuropathy. She was released from follow up here in 2012.  She underwent bilateral diagnostic mammography with tomography and right breast ultrasonography at [facility] on 09/22/2020 showing: breast density category A; 2.2 cm irregular mass in upper-inner right breast; no significant abnormalities in right axilla.  Accordingly on 09/22/2020 she proceeded to biopsy of the right breast area in question. The pathology from this procedure ([id number]) showed: invasive ductal carcinoma, grade 3. Prognostic indicators significant for: estrogen receptor, 0% negative and progesterone receptor, 0% negative. Proliferation marker Ki67 at 80%. HER2 negatve by immunohistochemistry (1+).   Cancer Staging  Malignant neoplasm of upper-inner quadrant of right breast in female, estrogen receptor negative (HCC) Staging form: Breast, AJCC 8th Edition - Clinical stage from 09/29/2020: Stage IIB (rcT2, cN0, cM0, G3, ER-, PR-, HER2-) - Signed by Penny Dell, MD on 09/29/2020 Stage prefix: Recurrence  The  patient's subsequent history is as detailed below.   PAST MEDICAL HISTORY: Past Medical History:  Diagnosis Date   Breast cancer (HCC) 2008   Right Breast Cancer   Breast cancer (HCC) 09/2020   Right Breast   Cancer (HCC)    Family history of breast cancer    Family history of prostate cancer    Personal history of chemotherapy 2008   Right Breast Cancer   Personal history of radiation therapy 2008   Right Breast Cancer   Port-A-Cath in place 10/21/2020    PAST SURGICAL HISTORY: Past Surgical History:  Procedure Laterality Date   BIOPSY  04/28/2022   Procedure: BIOPSY;  Surgeon: Penny Dare, MD;  Location: [facility] ENDOSCOPY;  Service: Gastroenterology;;   BREAST LUMPECTOMY Right 2008   BREAST REDUCTION SURGERY Left 06/03/2021   Procedure: MAMMARY REDUCTION  (BREAST);  Surgeon: Penny Fellows, MD;  Location: [facility];  Service: Plastics;  Laterality: Left;   CESAREAN SECTION     ESOPHAGOGASTRODUODENOSCOPY (EGD) WITH PROPOFOL N/A 04/28/2022   Procedure: ESOPHAGOGASTRODUODENOSCOPY (EGD) WITH PROPOFOL;  Surgeon: Penny Dare, MD;  Location: [facility] ENDOSCOPY;  Service: Gastroenterology;  Laterality: N/A;   HOT HEMOSTASIS N/A 04/28/2022   Procedure: HOT HEMOSTASIS (ARGON PLASMA COAGULATION/BICAP);  Surgeon: Penny Dare, MD;  Location: [facility] ENDOSCOPY;  Service: Gastroenterology;  Laterality: N/A;   IR CV LINE INJECTION  08/25/2021   MASTECTOMY Right 05/2021   MASTECTOMY W/ SENTINEL NODE BIOPSY Right 02/24/2021   Procedure: RIGHT MASTECTOMY WITH RIGHT AXILLARY SENTINEL LYMPH NODE BIOPSY;  Surgeon: Penny Loron, MD;  Location: [facility] OR;  Service: General;  Laterality: Right;   PORTACATH PLACEMENT Left 10/13/2020   Procedure: INSERTION PORT-A-CATH;  Surgeon: Penny Loron, MD;  Location: [facility];  Service: General;  Laterality: Left;  LEAVE PORT ACCESSED CHEMO STARTS ON 2/10, LEFT SIDE   REDUCTION MAMMAPLASTY Left 06/2021    FAMILY  HISTORY: Family History  Problem Relation Age of Onset   Breast cancer Maternal Grandmother 78   Prostate cancer Maternal Grandfather        dx 70s, metastatic   Colon cancer Neg Hx    Stomach cancer Neg Hx    Esophageal cancer Neg Hx    Her father died from heart attack (possibly Covid?) at age 66. Her mother is age 57 as of 09/2020. Kaamilya has 2 brothers and 2 sisters. She reports breast cancer in her maternal grandmother in her late 29's.   GYNECOLOGIC HISTORY:  No LMP recorded. Menarche: 52 years old Age at first live birth: 52 years old GX P 3 LMP 09/09/2020, periods are regular, 7 days with 4 heavy Contraceptive: s/p BTL with coils in place HRT n/a  Hysterectomy? no BSO? 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 Husband Rocky Link is a Veterinary surgeon. She lives at home with Rocky Link, daughter Esaw Dace, age 72, and daughter Elmarie Shiley, age 47 who works as a Risk manager. Son Iantha Fallen, age 4, is a Therapist, nutritional in [employer]. Brenya has one grandchild. She attends [employer].    ADVANCED DIRECTIVES: In the absence of any documentation to the contrary, the patient's spouse is their HCPOA.    HEALTH MAINTENANCE: Social History   Tobacco Use   Smoking status: Never   Smokeless tobacco: Never  Vaping Use   Vaping status: Never Used  Substance Use Topics   Alcohol use: No   Drug use: No  Colonoscopy: n/a (age)   PAP: 2019  Bone density: n/a (age)   No Known Allergies  Current Outpatient Medications  Medication Sig Dispense Refill   acetaminophen (TYLENOL) 500 MG tablet Take 500 mg by mouth every 4 (four) hours as needed (pain).     albuterol (VENTOLIN HFA) 108 (90 Base) MCG/ACT inhaler Inhale 2 puffs into the lungs every 6 (six) hours as needed for wheezing or shortness of breath. 8 g 2   docusate sodium (COLACE) 100 MG capsule Take 100 mg by mouth 2 (two) times daily.     ferrous  sulfate 325 (65 FE) MG tablet Take 1 tablet (325 mg total) by mouth daily with breakfast. 30 tablet 3   linaclotide (LINZESS) 72 MCG capsule Take 1 capsule (72 mcg total) by mouth daily as needed (constipation). 30 capsule 2   pantoprazole (PROTONIX) 40 MG tablet Take 1 tablet (40 mg total) by mouth daily. 30 tablet 11   No current facility-administered medications for this visit.    OBJECTIVE:   Vitals:   06/19/23 1603  BP: (!) 151/80  Pulse: (!) 104  Resp: 18  Temp: (!) 97.5 F (36.4 C)  SpO2: 100%      Body mass index is 30.11 kg/m.   Wt Readings from Last 3 Encounters:  06/19/23 164 lb 9.6 oz (74.7 kg)  03/14/23 162 lb (73.5 kg)  08/17/22 166 lb (75.3 kg)     ECOG FS:1 - Symptomatic but completely ambulatory  GENERAL: Patient is a well appearing female in no acute distress She had right breast mastectomy with reconstruction.  No palpable masses or concern for local recurrence.  No regional adenopathy on the right side.  On the left, she had reduction mammoplasty but ended up losing her nipple because of poor wound healing.  There is no palpable mass in the left breast, surgical scarring noted from the reduction mammoplasty.  No regional adenopathy Maculopapular rash noted on the chest wall. No lower extremity edema  LAB RESULTS:  CMP     Component Value Date/Time   NA 137 01/12/2023 1534   K 3.5 01/12/2023 1534   CL 104 01/12/2023 1534   CO2 23 01/12/2023 1534   GLUCOSE 113 (H) 01/12/2023 1534   BUN 7 01/12/2023 1534   CREATININE 0.67 01/12/2023 1534   CREATININE 0.72 09/29/2021 1343   CALCIUM 8.8 01/12/2023 1534   PROT 7.1 01/12/2023 1534   ALBUMIN 3.9 01/12/2023 1534   AST 14 01/12/2023 1534   AST 15 09/29/2021 1343   ALT 8 01/12/2023 1534   ALT 10 09/29/2021 1343   ALKPHOS 92 01/12/2023 1534   BILITOT 0.2 01/12/2023 1534   BILITOT 0.4 09/29/2021 1343   GFRNONAA >60 05/01/2022 0745   GFRNONAA >60 09/29/2021 1343   GFRAA  04/11/2007 1225    >60         The eGFR has been calculated using the MDRD equation. This calculation has not been validated in all clinical    No results found for: "TOTALPROTELP", "ALBUMINELP", "A1GS", "A2GS", "BETS", "BETA2SER", "GAMS", "MSPIKE", "SPEI"  Lab Results  Component Value Date   WBC 7.1 01/12/2023   NEUTROABS 4.4 01/12/2023   HGB 10.9 (L) 01/12/2023   HCT 34.4 (L) 01/12/2023   MCV 83.8 01/12/2023   PLT 271.0 01/12/2023    Lab Results  Component Value Date   LABCA2 17 11/25/2007    No components found for: "WUJWJX914"  No results for input(s): "INR" in the last 168 hours.  Lab Results  Component Value Date   LABCA2 17 11/25/2007    No results found for: "CAN199"  No results found for: "CAN125"  No results found for: "CAN153"  No results found for: "CA2729"  No components found for: "HGQUANT"  No results found for: "CEA1", "CEA" / No results found for: "CEA1", "CEA"   No results found for: "AFPTUMOR"  No results found for: "CHROMOGRNA"  No results found for: "KPAFRELGTCHN", "LAMBDASER", "KAPLAMBRATIO" (kappa/lambda light chains)  No results found for: "HGBA", "HGBA2QUANT", "HGBFQUANT", "HGBSQUAN" (Hemoglobinopathy evaluation)   Lab Results  Component Value Date   LDH 156 11/25/2007    Lab Results  Component Value Date   IRON 37 04/28/2022   TIBC 277 04/28/2022   IRONPCTSAT 13 04/28/2022   (Iron and TIBC)  Lab Results  Component Value Date   FERRITIN 41 04/28/2022    Urinalysis    Component Value Date/Time   COLORURINE YELLOW 04/28/2022 0121   APPEARANCEUR HAZY (A) 04/28/2022 0121   LABSPEC 1.038 (H) 04/28/2022 0121   PHURINE 5.0 04/28/2022 0121   GLUCOSEU NEGATIVE 04/28/2022 0121   HGBUR SMALL (A) 04/28/2022 0121   BILIRUBINUR NEGATIVE 04/28/2022 0121   KETONESUR 20 (A) 04/28/2022 0121   PROTEINUR NEGATIVE 04/28/2022 0121   UROBILINOGEN 0.2 03/13/2017 1905   NITRITE NEGATIVE 04/28/2022 0121   LEUKOCYTESUR NEGATIVE 04/28/2022 0121    STUDIES: No  results found.   ELIGIBLE FOR AVAILABLE RESEARCH PROTOCOL: no  ASSESSMENT: 52 y.o. [place] woman  (1) status post right breast upper outer quadrant lumpectomy and sentinel lymph node sampling 04/15/2007 for a pT2 pN1, stage IIIB invasive ductal carcinoma, grade 3, triple negative, with an MIB-1 of 59%  (a) adjuvant chemotherapy consisted of doxorubicin and cyclophosphamide in dose dense fashion x4 followed by weekly paclitaxel x12  (b) received adjuvant radiation  (c) a total of 4 right axillary lymph nodes were removed  (2) status post right breast upper inner quadrant biopsy 09/22/2020 for a clinical T2 N2, stage IIIC invasive ductal carcinoma, grade 3, triple negative, with an MIB-1 of 80%.  (a) chest CT 10/28/2020 shows indeterminate 0.3 cm or smaller pulmonary nodules  (b) bone scan 10/29/2020 shows no evidence of metatstatic disease  (3) genetics testing 10/19/2020 through the Common hereditary Cancers panel offered by [facility] shows no deleterious mutations  (4) neoadjuvant chemotherapy started 10/14/2020 consisting of pembrolizumab given every 3 weeks, with carboplatin + paclitaxel given weekly, 3 weeks on, one week off, wityh 4 cycles planned (12 carbo/Taxol doses), last dose 12/17/2020  (a) final 4 cycles of chemotherapy omitted because of neuropathy  (b) breast MRI 12/24/2020 shows near complete resolution of malignancy  (5) status post right mastectomy and sentinel lymph node sampling 02/24/2021 showing a residual ypT1a ypN0 invasive ductal carcinoma, grade 3, with ample margins  (a) a single right axillary lymph node was removed  (6) to continue pembrolizumab to total one year (9 post-op doses)   PLAN:  Patient is now on surveillance, previously completed adjuvant immunotherapy and neoadjuvant chemotherapy.  She has no adverse effects from the chemoimmunotherapy at this point.  She is not on antiestrogen therapy, had triple negative disease.  She is now post  reconstruction and satisfied with the cosmetic outcome.  No concern for recurrence on local exam or from review of systems. For the skin rash, prescribed topical betamethasone, she should also follow-up with dermatology as scheduled. She should continue left breast mammograms annually.  She can return to clinic in 6 months or sooner as needed.  Last mammogram  from April which showed possible mass in the left axilla.  Ultrasound showed single abnormal lymph node within the lower left axilla warranting further evaluation, biopsy of this showed benign reactive lymph node.  Recommendation was to return to screening mammogram due April 2025 and self breast exams  Total encounter time: 30 minutes in face to face visit time, chart review, lab review, order entry, and documentation of the encounter.   *Total Encounter Time as defined by the Centers for Medicare and Medicaid Services includes, in addition to the face-to-face time of a patient visit (documented in the note above) non-face-to-face time: obtaining and reviewing outside history, ordering and reviewing medications, tests or procedures, care coordination (communications with other health care professionals or caregivers) and documentation in the medical record.

## 2023-06-20 ENCOUNTER — Telehealth: Payer: Self-pay | Admitting: Hematology and Oncology

## 2023-06-20 NOTE — Telephone Encounter (Signed)
Spoke with patient confirming upcoming appointment  

## 2023-06-22 ENCOUNTER — Encounter: Payer: Self-pay | Admitting: Internal Medicine

## 2023-06-22 ENCOUNTER — Ambulatory Visit (INDEPENDENT_AMBULATORY_CARE_PROVIDER_SITE_OTHER): Payer: 59 | Admitting: Internal Medicine

## 2023-06-22 VITALS — BP 124/80 | HR 87 | Ht 62.0 in | Wt 167.0 lb

## 2023-06-22 DIAGNOSIS — J4520 Mild intermittent asthma, uncomplicated: Secondary | ICD-10-CM

## 2023-06-22 DIAGNOSIS — J301 Allergic rhinitis due to pollen: Secondary | ICD-10-CM

## 2023-06-22 DIAGNOSIS — C50211 Malignant neoplasm of upper-inner quadrant of right female breast: Secondary | ICD-10-CM

## 2023-06-22 DIAGNOSIS — R0602 Shortness of breath: Secondary | ICD-10-CM | POA: Diagnosis not present

## 2023-06-22 DIAGNOSIS — C50411 Malignant neoplasm of upper-outer quadrant of right female breast: Secondary | ICD-10-CM

## 2023-06-22 DIAGNOSIS — Z171 Estrogen receptor negative status [ER-]: Secondary | ICD-10-CM

## 2023-06-22 LAB — POCT EXHALED NITRIC OXIDE: FeNO level (ppb): 36

## 2023-06-22 MED ORDER — MONTELUKAST SODIUM 10 MG PO TABS: 10.0000 mg | ORAL_TABLET | Freq: Every day | ORAL | 11 refills | Status: AC

## 2023-06-22 MED ORDER — ALBUTEROL SULFATE HFA 108 (90 BASE) MCG/ACT IN AERS
2.0000 | INHALATION_SPRAY | Freq: Four times a day (QID) | RESPIRATORY_TRACT | 2 refills | Status: AC | PRN
Start: 2023-06-22 — End: ?

## 2023-06-22 NOTE — Progress Notes (Signed)
Penny Brown    [id number]    04-27-1971  Primary Care Physician:Strup, Samara Deist, FNP  Referring Physician: Avanell Shackleton, NP-C [address] [place],  [place] [address] Reason for Consultation: shortness of breath Date of Consultation: 06/22/2023  Chief complaint:   Chief Complaint  Patient presents with   Consult    Pt is here for Consult visit. Pt c/o intermittent SOB since COVID 10-2021.     HPI: Penny Brown is a 52 y.o. woman who presents for new patient evaluation for shortness of breath. Past medical history of breast cancer s/p mastectomy, chemo, and radiation. Symptoms started about 1 years ago when she had covid infection. She was given an albuterol inhaler around that time and this helped her breathing. She was taking about once a month for dyspnea with exertion. She denies any issues with her breathing as an adult. No childhood asthma. She does have seasonal allergies which she takes claritin and sudafed.   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  Exposures: lives with 2 children and husband, no pets.  Smoking history: passive smoke exposure in childhood  Social History   Occupational History   Not on file  Tobacco Use   Smoking status: Never    Passive exposure: Past   Smokeless tobacco: Never  Vaping Use   Vaping status: Never Used  Substance and Sexual Activity   Alcohol use: No   Drug use: No   Sexual activity: Not on file    Relevant family history:  Family History  Problem Relation Age of Onset   COPD Mother    Breast cancer Maternal Grandmother 30   Prostate cancer Maternal Grandfather        dx 18s, metastatic   Colon cancer Neg Hx    Stomach cancer Neg Hx    Esophageal cancer Neg Hx     Past Medical History:  Diagnosis Date   Breast cancer (HCC) 2008   Right Breast Cancer   Breast cancer (HCC) 09/2020   Right Breast   Cancer (HCC)    Family history of breast cancer    Family  history of prostate cancer    Personal history of chemotherapy 2008   Right Breast Cancer   Personal history of radiation therapy 2008   Right Breast Cancer   Port-A-Cath in place 10/21/2020    Past Surgical History:  Procedure Laterality Date   BIOPSY  04/28/2022   Procedure: BIOPSY;  Surgeon: Meryl Dare, MD;  Location[facility] ENDOSCOPY;  Service: Gastroenterology;;   BREAST LUMPECTOMY Right 2008   BREAST REDUCTION SURGERY Left 06/03/2021   Procedure: MAMMARY REDUCTION  (BREAST);  Surgeon: Glenna Fellows, MD;  Location: [facility];  Service: Plastics;  Laterality: Left;   CESAREAN SECTION     ESOPHAGOGASTRODUODENOSCOPY (EGD) WITH PROPOFOL N/A 04/28/2022   Procedure: ESOPHAGOGASTRODUODENOSCOPY (EGD) WITH PROPOFOL;  Surgeon: Meryl Dare, MD;  Location: [facility] ENDOSCOPY;  Service: Gastroenterology;  Laterality: N/A;   HOT HEMOSTASIS N/A 04/28/2022   Procedure: HOT HEMOSTASIS (ARGON PLASMA COAGULATION/BICAP);  Surgeon: Meryl Dare, MD;  Location: [facility] ENDOSCOPY;  Service: Gastroenterology;  Laterality: N/A;   IR CV LINE INJECTION  08/25/2021   MASTECTOMY Right 05/2021   MASTECTOMY W/ SENTINEL NODE BIOPSY Right 02/24/2021   Procedure: RIGHT MASTECTOMY WITH RIGHT AXILLARY SENTINEL LYMPH NODE BIOPSY;  Surgeon: Emelia Loron, MD;  Location: [facility] OR;  Service: General;  Laterality: Right;  PORTACATH PLACEMENT Left 10/13/2020   Procedure: INSERTION PORT-A-CATH;  Surgeon: Emelia Loron, MD;  Location: [facility];  Service: General;  Laterality: Left;  LEAVE PORT ACCESSED CHEMO STARTS ON 2/10, LEFT SIDE   REDUCTION MAMMAPLASTY Left 06/2021     Physical Exam: Blood pressure 124/80, pulse 87, SpO2 100%. Gen:      No acute distress ENT:  left nare is almost completely obstructed, right nare more patent, mild erythemano nasal polyps, mucus membranes moist Lungs:    No increased respiratory effort, symmetric chest wall excursion, clear to auscultation  bilaterally, no wheezes or crackles CV:         Regular rate and rhythm; no murmurs, rubs, or gallops.  No pedal edema Abd:      + bowel sounds; soft, non-tender; no distension MSK: no acute synovitis of DIP or PIP joints, no mechanics hands.  Skin:      Warm and dry; no rashes Neuro: normal speech, no focal facial asymmetry Psych: alert and oriented x3, normal mood and affect   Data Reviewed/Medical Decision Making:  Independent interpretation of tests: Imaging:  Review of patient's CT Chest August 2023 images revealed no parenchymal lung disease. The patient's images have been independently reviewed by me.    PFTs:    Labs:  Lab Results  Component Value Date   WBC 7.1 01/12/2023   HGB 10.9 (L) 01/12/2023   HCT 34.4 (L) 01/12/2023   MCV 83.8 01/12/2023   PLT 271.0 01/12/2023     Immunization status:  Immunization History  Administered Date(s) Administered   PFIZER(Purple Top)SARS-COV-2 Vaccination 11/22/2019, 12/17/2019, 07/03/2020     I reviewed prior external note(s) from primary care  I reviewed the result(s) of the labs and imaging as noted above.   I have ordered feno   Assessment:  Mild intermittent asthma Seasonal allergic rhinitis  Plan/Recommendations: Feno obtained today 36 ppb - reviewed results, consistent with increased allergic inflammation  Take the albuterol rescue inhaler every 4 to 6 hours as needed for wheezing or shortness of breath.  Switch your claritin to xyzal or zyrtec. If this isn't enough you can add the montelukast which I've prescribed. Please don't take sudafed for nasal congestion related to allergies - side effects of rebound congestion and high blood pressure.   As long as you are using albuterol about once a week or less, fine to carry on. If your symptoms are worsening come see me sooner and you may need a maintenance inhaler for your breathing.    We discussed disease management and progression at length today.    Return to  Care: Return in about 1 year (around 06/21/2024).  Durel Salts, MD Pulmonary and Critical Care Medicine [facility] Office:[phone]  CC: Avanell Shackleton, NP-C

## 2023-06-22 NOTE — Patient Instructions (Addendum)
It was a pleasure to see you today!  Please schedule follow up scheduled with myself in 1 year.  If my schedule is not open yet, we will contact you with a reminder closer to that time. Please call [phone] if you haven't heard from [place] a month before, and always call us sooner if issues or concerns arise. You can also send [place] a message through MyChart, but but aware that this is not to be used for urgent issues and it may take up to 5-7 days to receive a reply. Please be aware that you will likely be able to view your results before I have a chance to respond to them. Please give [place] 5 business days to respond to any non-urgent results.    Take the albuterol rescue inhaler every 4 to 6 hours as needed for wheezing or shortness of breath. You can also take it 15 minutes before exercise or exertional activity. Side effects include heart racing or pounding, jitters or anxiety. If you have a history of an irregular heart rhythm, it can make this worse. Can also give some patients a hard time sleeping.  Switch your claritin to xyzal or zyrtec. If this isn't enough you can add the montelukast which I've prescribed. Please don't take sudafed for nasal congestion related to allergies - side effects of rebound congestion and high blood pressure.   As long as you are using albuterol about once a week or less, fine to carry on. If your symptoms are worsening come see me sooner and you may need a maintenance inhaler for your breathing.

## 2023-07-20 ENCOUNTER — Encounter: Payer: Self-pay | Admitting: Gastroenterology

## 2023-08-23 ENCOUNTER — Other Ambulatory Visit: Payer: Self-pay

## 2023-08-23 ENCOUNTER — Inpatient Hospital Stay: Payer: 59 | Attending: Hematology and Oncology | Admitting: Adult Health

## 2023-08-23 ENCOUNTER — Encounter: Payer: Self-pay | Admitting: Adult Health

## 2023-08-23 VITALS — BP 136/76 | HR 103 | Temp 98.0°F | Resp 16 | Ht 62.0 in | Wt 164.8 lb

## 2023-08-23 DIAGNOSIS — N644 Mastodynia: Secondary | ICD-10-CM | POA: Diagnosis present

## 2023-08-23 DIAGNOSIS — C50211 Malignant neoplasm of upper-inner quadrant of right female breast: Secondary | ICD-10-CM | POA: Insufficient documentation

## 2023-08-23 DIAGNOSIS — N6325 Unspecified lump in the left breast, overlapping quadrants: Secondary | ICD-10-CM

## 2023-08-23 DIAGNOSIS — Z171 Estrogen receptor negative status [ER-]: Secondary | ICD-10-CM | POA: Diagnosis not present

## 2023-08-23 NOTE — Progress Notes (Signed)
[facility] Cancer Follow up:    Penny Cocker, FNP [address] [place] [address]   DIAGNOSIS:  Cancer Staging  Malignant neoplasm of upper-inner quadrant of right breast in female, estrogen receptor negative (HCC) Staging form: Breast, AJCC 8th Edition - Clinical stage from 09/29/2020: Stage IIB (rcT2, cN0, cM0, G3, ER-, PR-, HER2-) - Signed by Lowella Dell, MD on 09/29/2020 Stage prefix: Recurrence   SUMMARY OF ONCOLOGIC HISTORY: Oncology History  Malignant neoplasm of upper-inner quadrant of right breast in female, estrogen receptor negative (HCC)  09/27/2020 Initial Diagnosis   (1) status post right breast upper outer quadrant lumpectomy and sentinel lymph node sampling 04/15/2007 for a pT2 pN1, stage IIIB invasive ductal carcinoma, grade 3, triple negative, with an MIB-1 of 59%             (a) adjuvant chemotherapy consisted of doxorubicin and cyclophosphamide in dose dense fashion x4 followed by weekly paclitaxel x12             (b) received adjuvant radiation             (c) a total of 4 right axillary lymph nodes were removed   (2) status post right breast upper inner quadrant biopsy 09/22/2020 for a clinical T2 N2, stage IIIC invasive ductal carcinoma, grade 3, triple negative, with an MIB-1 of 80%.             (a) chest CT 10/28/2020 shows indeterminate 0.3 cm or smaller pulmonary nodules             (b) bone scan 10/29/2020 shows no evidence of metatstatic disease         09/29/2020 Cancer Staging   Staging form: Breast, AJCC 8th Edition - Clinical stage from 09/29/2020: Stage IIB (rcT2, cN0, cM0, G3, ER-, PR-, HER2-) - Signed by Lowella Dell, MD on 09/29/2020   10/14/2020 - 12/17/2020 Chemotherapy      Patient is on Antibody Plan: BREAST PEMBROLIZUMAB Q21D    10/14/2020 - 12/17/2020 Neo-Adjuvant Chemotherapy   neoadjuvant chemotherapy started 10/14/2020 consisting of pembrolizumab given every 3 weeks, with carboplatin +  paclitaxel given weekly, 3 weeks on, one week off, wityh 4 cycles planned (12 carbo/Taxol doses), last dose 12/17/2020             (a) final 4 cycles of chemotherapy omitted because of neuropathy             (b) breast MRI 12/24/2020 shows near complete resolution of malignancy     10/19/2020 Genetic Testing   Negative genetic testing:  No pathogenic variants detected on the [facility] Common Hereditary Cancers panel. The report date is 10/19/2020.   The Common Hereditary Cancers Panel offered by [facility] includes sequencing and/or deletion duplication testing of the following 48 genes: APC, ATM, AXIN2, BARD1, BMPR1A, BRCA1, BRCA2, BRIP1, CDH1, CDK4, CDKN2A (p14ARF), CDKN2A (p16INK4a), CHEK2, CTNNA1, DICER1, EPCAM (Deletion/duplication testing only), GREM1 (promoter region deletion/duplication testing only), KIT, MEN1, MLH1, MSH2, MSH3, MSH6, MUTYH, NBN, NF1, NTHL1, PALB2, PDGFRA, PMS2, POLD1, POLE, PTEN, RAD50, RAD51C, RAD51D, RNF43, SDHB, SDHC, SDHD, SMAD4, SMARCA4. STK11, TP53, TSC1, TSC2, and VHL.  The following genes were evaluated for sequence changes only: SDHA and HOXB13 c.251G>A variant only.   11/04/2020 - 11/11/2021 Chemotherapy   Patient is on Treatment Plan : BREAST Pembrolizumab Q21D     02/24/2021 Surgery   status post right mastectomy and sentinel lymph node sampling 02/24/2021 showing a residual ypT1a ypN0 invasive ductal carcinoma, grade  3, with ample margins             (a) a single right axillary lymph node was removed     CURRENT THERAPY: observation  INTERVAL HISTORY:  Discussed the use of AI scribe software for clinical note transcription with the patient, who gave verbal consent to proceed.  Penny Brown 52 y.o. female returns for previously diagnosed with triple negative breast cancer of the right breast in 2022.  She presents with concerns about left breast pain. She describes the pain as a hard ball that has started to break up or break down, causing significant  discomfort. The pain is localized and tender to touch. The patient has a history of a breast reduction surgery, after which the left breast opened up and then closed back up, leading to the current hard ball sensation. She had an ultrasound earlier this year, but due to the persistent pain and her history of breast cancer, she is seeking further evaluation.   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  MEDICAL HISTORY: Past Medical History:  Diagnosis Date   Breast cancer (HCC) 2008   Right Breast Cancer   Breast cancer (HCC) 09/2020   Right Breast   Cancer (HCC)    Family history of breast cancer    Family history of prostate cancer    Personal history of chemotherapy 2008   Right Breast Cancer   Personal history of radiation therapy 2008   Right Breast Cancer   Port-A-Cath in place 10/21/2020    SURGICAL HISTORY: Past Surgical History:  Procedure Laterality Date   BIOPSY  04/28/2022   Procedure: BIOPSY;  Surgeon: Meryl Dare, MD;  Location: [facility] ENDOSCOPY;  Service: Gastroenterology;;   BREAST LUMPECTOMY Right 2008   BREAST REDUCTION SURGERY Left 06/03/2021   Procedure: MAMMARY REDUCTION  (BREAST);  Surgeon: Glenna Fellows, MD;  Location: [facility];  Service: Plastics;  Laterality: Left;   CESAREAN SECTION     ESOPHAGOGASTRODUODENOSCOPY  (EGD) WITH PROPOFOL N/A 04/28/2022   Procedure: ESOPHAGOGASTRODUODENOSCOPY (EGD) WITH PROPOFOL;  Surgeon: Meryl Dare, MD;  Location: [facility] ENDOSCOPY;  Service: Gastroenterology;  Laterality: N/A;   HOT HEMOSTASIS N/A 04/28/2022   Procedure: HOT HEMOSTASIS (ARGON PLASMA COAGULATION/BICAP);  Surgeon: Meryl Dare, MD;  Location: [facility] ENDOSCOPY;  Service: Gastroenterology;  Laterality: N/A;   IR CV LINE INJECTION  08/25/2021   MASTECTOMY Right 05/2021   MASTECTOMY W/ SENTINEL NODE BIOPSY Right 02/24/2021   Procedure: RIGHT MASTECTOMY WITH RIGHT AXILLARY SENTINEL LYMPH NODE BIOPSY;  Surgeon: Emelia Loron, MD;  Location: [facility] OR;  Service: General;  Laterality: Right;   PORTACATH PLACEMENT Left 10/13/2020   Procedure: INSERTION PORT-A-CATH;  Surgeon: Emelia Loron, MD;  Location: [facility];  Service: General;  Laterality: Left;  LEAVE PORT ACCESSED CHEMO STARTS ON 2/10,  LEFT SIDE   REDUCTION MAMMAPLASTY Left 06/2021    SOCIAL HISTORY: Social History   Socioeconomic History   Marital status: Married    Spouse name: Not on file   Number of children: Not on file   Years of education: Not on file   Highest education level: Not on file  Occupational History   Not on file  Tobacco Use   Smoking status: Never    Passive exposure: Past   Smokeless tobacco: Never  Vaping Use   Vaping status: Never Used  Substance and Sexual Activity   Alcohol use: No   Drug use: No   Sexual activity: Not on file  Other Topics Concern   Not on file  Social History Narrative   Not on file   Social Drivers of Health   Financial Resource Strain: Low Risk  (09/29/2020)   Overall Financial Resource Strain (CARDIA)    Difficulty of Paying Living Expenses: Not hard at all  Food Insecurity: Not on file  Transportation Needs: No Transportation Needs (09/29/2020)   PRAPARE - Administrator, Civil Service (Medical): No    Lack of Transportation (Non-Medical): No  Physical  Activity: Not on file  Stress: Not on file  Social Connections: Not on file  Intimate Partner Violence: Not At Risk (01/18/2021)   Humiliation, Afraid, Rape, and Kick questionnaire    Fear of Current or Ex-Partner: No    Emotionally Abused: No    Physically Abused: No    Sexually Abused: No    FAMILY HISTORY: Family History  Problem Relation Age of Onset   COPD Mother    Breast cancer Maternal Grandmother 20   Prostate cancer Maternal Grandfather        dx 76s, metastatic   Colon cancer Neg Hx    Stomach cancer Neg Hx    Esophageal cancer Neg Hx     Review of Systems  Constitutional:  Negative for appetite change, chills, fatigue, fever and unexpected weight change.  HENT:   Negative for hearing loss, lump/mass and trouble swallowing.   Eyes:  Negative for eye problems and icterus.  Respiratory:  Negative for chest tightness, cough and shortness of breath.   Cardiovascular:  Negative for chest pain, leg swelling and palpitations.  Gastrointestinal:  Negative for abdominal distention, abdominal pain, constipation, diarrhea, nausea and vomiting.  Endocrine: Negative for hot flashes.  Genitourinary:  Negative for difficulty urinating.   Musculoskeletal:  Negative for arthralgias.  Skin:  Negative for itching and rash.  Neurological:  Negative for dizziness, extremity weakness, headaches and numbness.  Hematological:  Negative for adenopathy. Does not bruise/bleed easily.  Psychiatric/Behavioral:  Negative for depression. The patient is not nervous/anxious.       PHYSICAL EXAMINATION   Onc Performance Status - 08/23/23 1300       KPS SCALE   KPS % SCORE Normal, no compliants, no evidence of disease             Vitals:   08/23/23 1255  BP: 136/76  Pulse: (!) 103  Resp: 16  Temp: 98 F (36.7 C)  SpO2: 99%    Physical Exam Constitutional:      General: She is not in acute distress.    Appearance: Normal appearance. She is not toxic-appearing.  HENT:      Head: Normocephalic and atraumatic.     Mouth/Throat:     Mouth: Mucous membranes are moist.     Pharynx: Oropharynx is clear. No oropharyngeal exudate or  posterior oropharyngeal erythema.  Eyes:     General: No scleral icterus. Cardiovascular:     Rate and Rhythm: Normal rate and regular rhythm.     Pulses: Normal pulses.     Heart sounds: Normal heart sounds.  Pulmonary:     Effort: Pulmonary effort is normal.     Breath sounds: Normal breath sounds.  Chest:     Comments: + left breast mass at 3 o'clock Abdominal:     General: Abdomen is flat. Bowel sounds are normal. There is no distension.     Palpations: Abdomen is soft.     Tenderness: There is no abdominal tenderness.  Musculoskeletal:        General: No swelling.     Cervical back: Neck supple.  Lymphadenopathy:     Cervical: No cervical adenopathy.     Upper Body:     Right upper body: No axillary adenopathy.     Left upper body: No axillary adenopathy.  Skin:    General: Skin is warm and dry.     Findings: No rash.  Neurological:     General: No focal deficit present.     Mental Status: She is alert.  Psychiatric:        Mood and Affect: Mood normal.        Behavior: Behavior normal.     ASSESSMENT and THERAPY PLAN:   Malignant neoplasm of upper-inner quadrant of right breast in female, estrogen receptor negative (HCC) Left Breast Pain Patient with history of triple negative breast cancer in 2022, now with new left breast pain. Likely fat necrosis, but due to history of breast cancer, imaging is necessary to rule out recurrence. -Order mammogram and ultrasound of left breast. -Expect results on the same day of imaging. -Contact patient with results and further plan based on imaging findings.  RTC 12/2023 for f/u with Dr. Al Pimple    All questions were answered. The patient knows to call the clinic with any problems, questions or concerns. We can certainly see the patient much sooner if necessary.  Total  encounter time:20 minutes*in face-to-face visit time, chart review, lab review, care coordination, order entry, and documentation of the encounter time.    Lillard Anes, NP 08/24/23 11:21 AM Medical Oncology and Hematology [facility] [address] [place], [place] [address] Tel. [phone]    Fax. [phone]  *Total Encounter Time as defined by the Centers for Medicare and Medicaid Services includes, in addition to the face-to-face time of a patient visit (documented in the note above) non-face-to-face time: obtaining and reviewing outside history, ordering and reviewing medications, tests or procedures, care coordination (communications with other health care professionals or caregivers) and documentation in the medical record.

## 2023-08-24 NOTE — Assessment & Plan Note (Addendum)
Left Breast Pain Patient with history of triple negative breast cancer in 2022, now with new left breast pain. Likely fat necrosis, but due to history of breast cancer, imaging is necessary to rule out recurrence. -Order mammogram and ultrasound of left breast. -Expect results on the same day of imaging. -Contact patient with results and further plan based on imaging findings.  RTC 12/2023 for f/u with Dr. Al Pimple

## 2023-08-30 ENCOUNTER — Ambulatory Visit
Admission: RE | Admit: 2023-08-30 | Discharge: 2023-08-30 | Disposition: A | Discharge status: Home / Self Care | Payer: 59 | Source: Ambulatory Visit | Attending: Adult Health | Admitting: Adult Health

## 2023-08-30 DIAGNOSIS — N6325 Unspecified lump in the left breast, overlapping quadrants: Secondary | ICD-10-CM

## 2023-08-30 DIAGNOSIS — Z171 Estrogen receptor negative status [ER-]: Secondary | ICD-10-CM

## 2023-12-19 ENCOUNTER — Telehealth: Payer: Self-pay | Admitting: Hematology and Oncology

## 2023-12-19 NOTE — Telephone Encounter (Signed)
 Left a detailed message of appointment details. Informed patient to call back if appointments do not work.

## 2023-12-20 ENCOUNTER — Inpatient Hospital Stay: Payer: 59 | Admitting: Hematology and Oncology

## 2024-01-02 ENCOUNTER — Telehealth: Payer: Self-pay

## 2024-01-02 NOTE — Telephone Encounter (Signed)
 Left message on voicemail about appointment on 01/03/24

## 2024-01-03 ENCOUNTER — Inpatient Hospital Stay: Attending: Hematology and Oncology | Admitting: Hematology and Oncology

## 2024-01-21 ENCOUNTER — Ambulatory Visit: Payer: 59 | Admitting: Dermatology

## 2024-04-22 ENCOUNTER — Telehealth: Payer: Self-pay

## 2024-04-22 NOTE — Telephone Encounter (Signed)
 Pt called and states she is ready to have MD f/u. She was not ready in April. She denies any immediate concerns. Pt scheduled with MD 9/2 at 1400. She is agreeable to this.

## 2024-04-23 ENCOUNTER — Other Ambulatory Visit: Payer: Self-pay | Admitting: Adult Health

## 2024-04-23 DIAGNOSIS — Z1231 Encounter for screening mammogram for malignant neoplasm of breast: Secondary | ICD-10-CM

## 2024-04-28 ENCOUNTER — Telehealth: Payer: Self-pay | Admitting: Hematology and Oncology

## 2024-04-28 NOTE — Telephone Encounter (Signed)
 Penny Brown has been re-scheduled and she is aware of all appointment details.

## 2024-05-06 ENCOUNTER — Ambulatory Visit: Admitting: Hematology and Oncology

## 2024-05-08 ENCOUNTER — Inpatient Hospital Stay: Attending: Hematology and Oncology | Admitting: Hematology and Oncology

## 2024-05-08 ENCOUNTER — Inpatient Hospital Stay

## 2024-05-08 VITALS — BP 136/72 | HR 91 | Temp 98.1°F | Resp 91 | Wt 167.9 lb

## 2024-05-08 DIAGNOSIS — Z923 Personal history of irradiation: Secondary | ICD-10-CM | POA: Diagnosis not present

## 2024-05-08 DIAGNOSIS — Z79899 Other long term (current) drug therapy: Secondary | ICD-10-CM | POA: Diagnosis not present

## 2024-05-08 DIAGNOSIS — Z9011 Acquired absence of right breast and nipple: Secondary | ICD-10-CM | POA: Insufficient documentation

## 2024-05-08 DIAGNOSIS — Z9221 Personal history of antineoplastic chemotherapy: Secondary | ICD-10-CM | POA: Insufficient documentation

## 2024-05-08 DIAGNOSIS — Z803 Family history of malignant neoplasm of breast: Secondary | ICD-10-CM | POA: Insufficient documentation

## 2024-05-08 DIAGNOSIS — Z780 Asymptomatic menopausal state: Secondary | ICD-10-CM | POA: Diagnosis not present

## 2024-05-08 DIAGNOSIS — K259 Gastric ulcer, unspecified as acute or chronic, without hemorrhage or perforation: Secondary | ICD-10-CM | POA: Insufficient documentation

## 2024-05-08 DIAGNOSIS — Z1722 Progesterone receptor negative status: Secondary | ICD-10-CM | POA: Insufficient documentation

## 2024-05-08 DIAGNOSIS — C50211 Malignant neoplasm of upper-inner quadrant of right female breast: Secondary | ICD-10-CM | POA: Diagnosis present

## 2024-05-08 DIAGNOSIS — Z8711 Personal history of peptic ulcer disease: Secondary | ICD-10-CM | POA: Insufficient documentation

## 2024-05-08 DIAGNOSIS — Z1732 Human epidermal growth factor receptor 2 negative status: Secondary | ICD-10-CM | POA: Insufficient documentation

## 2024-05-08 DIAGNOSIS — L659 Nonscarring hair loss, unspecified: Secondary | ICD-10-CM | POA: Insufficient documentation

## 2024-05-08 DIAGNOSIS — Z171 Estrogen receptor negative status [ER-]: Secondary | ICD-10-CM | POA: Insufficient documentation

## 2024-05-08 MED ORDER — BETAMETHASONE VALERATE 0.1 % EX OINT: 1.0000 | TOPICAL_OINTMENT | Freq: Two times a day (BID) | CUTANEOUS | 0 refills | Status: AC

## 2024-05-08 MED ORDER — FERROUS SULFATE 325 (65 FE) MG PO TABS: 325.0000 mg | ORAL_TABLET | Freq: Every day | ORAL | 3 refills | Status: AC

## 2024-05-08 MED ORDER — PANTOPRAZOLE SODIUM 40 MG PO TBEC: 40.0000 mg | DELAYED_RELEASE_TABLET | Freq: Every day | ORAL | 1 refills | Status: AC

## 2024-05-08 NOTE — Progress Notes (Signed)
 [facility] Cancer Follow up:    Arvid Collar, FNP [address] [place] [address]   DIAGNOSIS:  Cancer Staging  Malignant neoplasm of upper-inner quadrant of right breast in female, estrogen receptor negative (HCC) Staging form: Breast, AJCC 8th Edition - Clinical stage from 09/29/2020: Stage IIB (rcT2, rcN0, rcM0, G3, ER-, PR-, HER2-) - Signed by Layla Sandria BROCKS, MD on 09/29/2020 Stage prefix: Recurrence   SUMMARY OF ONCOLOGIC HISTORY: Oncology History  Malignant neoplasm of upper-inner quadrant of right breast in female, estrogen receptor negative (HCC)  09/27/2020 Initial Diagnosis   (1) status post right breast upper outer quadrant lumpectomy and sentinel lymph node sampling 04/15/2007 for a pT2 pN1, stage IIIB invasive ductal carcinoma, grade 3, triple negative, with an MIB-1 of 59%             (a) adjuvant chemotherapy consisted of doxorubicin and cyclophosphamide in dose dense fashion x4 followed by weekly paclitaxel  x12             (b) received adjuvant radiation             (c) a total of 4 right axillary lymph nodes were removed   (2) status post right breast upper inner quadrant biopsy 09/22/2020 for a clinical T2 N2, stage IIIC invasive ductal carcinoma, grade 3, triple negative, with an MIB-1 of 80%.             (a) chest CT 10/28/2020 shows indeterminate 0.3 cm or smaller pulmonary nodules             (b) bone scan 10/29/2020 shows no evidence of metatstatic disease         09/29/2020 Cancer Staging   Staging form: Breast, AJCC 8th Edition - Clinical stage from 09/29/2020: Stage IIB (rcT2, cN0, cM0, G3, ER-, PR-, HER2-) - Signed by Layla Sandria BROCKS, MD on 09/29/2020   10/14/2020 - 12/17/2020 Chemotherapy      Patient is on Antibody Plan: BREAST PEMBROLIZUMAB  Q21D    10/14/2020 - 12/17/2020 Neo-Adjuvant Chemotherapy   neoadjuvant chemotherapy started 10/14/2020 consisting of pembrolizumab  given every 3 weeks, with carboplatin  +  paclitaxel  given weekly, 3 weeks on, one week off, wityh 4 cycles planned (12 carbo/Taxol  doses), last dose 12/17/2020             (a) final 4 cycles of chemotherapy omitted because of neuropathy             (b) breast MRI 12/24/2020 shows near complete resolution of malignancy     10/19/2020 Genetic Testing   Negative genetic testing:  No pathogenic variants detected on the [facility] Common Hereditary Cancers panel. The report date is 10/19/2020.   The Common Hereditary Cancers Panel offered by [facility] includes sequencing and/or deletion duplication testing of the following 48 genes: APC, ATM, AXIN2, BARD1, BMPR1A, BRCA1, BRCA2, BRIP1, CDH1, CDK4, CDKN2A (p14ARF), CDKN2A (p16INK4a), CHEK2, CTNNA1, DICER1, EPCAM (Deletion/duplication testing only), GREM1 (promoter region deletion/duplication testing only), KIT, MEN1, MLH1, MSH2, MSH3, MSH6, MUTYH, NBN, NF1, NTHL1, PALB2, PDGFRA, PMS2, POLD1, POLE, PTEN, RAD50, RAD51C, RAD51D, RNF43, SDHB, SDHC, SDHD, SMAD4, SMARCA4. STK11, TP53, TSC1, TSC2, and VHL.  The following genes were evaluated for sequence changes only: SDHA and HOXB13 c.251G>A variant only.   11/04/2020 - 11/11/2021 Chemotherapy   Patient is on Treatment Plan : BREAST Pembrolizumab  Q21D     02/24/2021 Surgery   status post right mastectomy and sentinel lymph node sampling 02/24/2021 showing a residual ypT1a ypN0 invasive ductal carcinoma, grade  3, with ample margins             (a) a single right axillary lymph node was removed     CURRENT THERAPY: observation  INTERVAL HISTORY:  Discussed the use of AI scribe software for clinical note transcription with the patient, who gave verbal consent to proceed.  History of Present Illness Penny Brown is a 53 year old female with a history of breast cancer who presents for medication refills and follow-up.  She is experiencing difficulty in finding a new primary care physician and a new gastroenterologist after her previous GI doctor  retired. She has been trying to get her medications refilled, including iron supplements and a topical ointment, but has faced challenges due to the transition between doctors.  She is currently taking pantoprazole  once daily for her ulcers, which she initially received during a hospital stay. Her current supply is expired, and she requires a refill. She also takes iron supplements and a topical ointment, which she refers to as 'the one that begins with a B'.  She has a history of breast cancer and has undergone chemotherapy. Her menstrual cycle stopped for several months post-chemotherapy but returned last month. She inquires if this is normal and mentions experiencing hair thinning.  She has undergone plastic surgery for breast reconstruction, which included the removal of one breast and reduction of the other. She experienced complications with the reduction, including loss of the nipple and scar tissue formation. She is contemplating further corrective surgery in the future.    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  MEDICAL HISTORY: Past Medical History:  Diagnosis Date   Breast cancer (HCC) 2008   Right Breast Cancer   Breast cancer (HCC) 09/2020   Right Breast   Cancer (HCC)    Family history of breast cancer    Family history of prostate cancer    Personal  history of chemotherapy 2008   Right Breast Cancer   Personal history of radiation therapy 2008   Right Breast Cancer   Port-A-Cath in place 10/21/2020    SURGICAL HISTORY: Past Surgical History:  Procedure Laterality Date   BIOPSY  04/28/2022   Procedure: BIOPSY;  Surgeon: Aneita Gwendlyn DASEN, MD;  Location: [facility] ENDOSCOPY;  Service: Gastroenterology;;   BREAST LUMPECTOMY Right 2008   BREAST REDUCTION SURGERY Left 06/03/2021   Procedure: MAMMARY REDUCTION  (BREAST);  Surgeon: Arelia Filippo, MD;  Location: [facility];  Service: Plastics;  Laterality: Left;   CESAREAN SECTION     ESOPHAGOGASTRODUODENOSCOPY (EGD) WITH PROPOFOL  N/A 04/28/2022   Procedure: ESOPHAGOGASTRODUODENOSCOPY (EGD) WITH PROPOFOL ;  Surgeon: Aneita Gwendlyn DASEN, MD;  Location: [facility] ENDOSCOPY;  Service: Gastroenterology;  Laterality: N/A;   HOT HEMOSTASIS N/A 04/28/2022   Procedure: HOT HEMOSTASIS (ARGON PLASMA COAGULATION/BICAP);  Surgeon: Aneita Gwendlyn DASEN, MD;  Location: [facility] ENDOSCOPY;  Service: Gastroenterology;  Laterality: N/A;   IR CV LINE INJECTION  08/25/2021   MASTECTOMY Right 05/2021   MASTECTOMY W/ SENTINEL NODE BIOPSY Right 02/24/2021   Procedure: RIGHT MASTECTOMY WITH RIGHT AXILLARY SENTINEL LYMPH NODE BIOPSY;  Surgeon: Ebbie Cough, MD;  Location: [facility] OR;  Service: General;  Laterality: Right;   PORTACATH PLACEMENT Left 10/13/2020   Procedure: INSERTION PORT-A-CATH;  Surgeon: Ebbie Cough, MD;  Location: [facility];  Service: General;  Laterality: Left;  LEAVE PORT ACCESSED CHEMO STARTS ON 2/10, LEFT SIDE   REDUCTION MAMMAPLASTY Left 06/2021    SOCIAL HISTORY: Social History   Socioeconomic History   Marital status: Married    Spouse name: Not on file   Number of children: Not on file   Years of education: Not on file   Highest education level: Not on file  Occupational History   Not on file  Tobacco Use   Smoking status: Never    Passive exposure: Past    Smokeless tobacco: Never  Vaping Use   Vaping status: Never Used  Substance and Sexual Activity   Alcohol use: No   Drug use: No   Sexual activity: Not on file  Other Topics Concern   Not on file  Social History Narrative   Not on file   Social Drivers of Health   Financial Resource Strain: Low Risk  (09/29/2020)   Overall Financial Resource Strain (CARDIA)    Difficulty of Paying Living Expenses: Not hard at all  Food Insecurity: Not on file  Transportation Needs: No Transportation Needs (09/29/2020)   PRAPARE - Administrator, Civil Service (Medical): No    Lack of Transportation (Non-Medical): No  Physical Activity: Not on file  Stress: Not on file  Social Connections: Not on file  Intimate Partner Violence: Not At Risk (01/18/2021)   Humiliation, Afraid, Rape, and Kick questionnaire    Fear of Current or Ex-Partner: No    Emotionally Abused: No    Physically Abused: No    Sexually Abused: No    FAMILY HISTORY: Family History  Problem Relation Age of Onset   COPD Mother    Breast cancer Maternal Grandmother 84   Prostate cancer Maternal Grandfather        dx 64s, metastatic   Colon cancer Neg Hx    Stomach cancer Neg Hx    Esophageal cancer Neg Hx     Review of Systems  Constitutional:  Negative for appetite change, chills, fatigue, fever and unexpected weight change.  HENT:   Negative for hearing loss, lump/mass and trouble swallowing.   Eyes:  Negative for eye problems and icterus.  Respiratory:  Negative for chest tightness, cough and shortness of breath.   Cardiovascular:  Negative for chest pain, leg swelling and palpitations.  Gastrointestinal:  Negative for abdominal distention, abdominal pain, constipation, diarrhea, nausea and vomiting.  Endocrine: Negative for hot flashes.  Genitourinary:  Negative for difficulty urinating.   Musculoskeletal:  Negative for arthralgias.  Skin:  Negative for itching and rash.  Neurological:  Negative for  dizziness, extremity weakness, headaches and numbness.  Hematological:  Negative for adenopathy. Does not bruise/bleed easily.  Psychiatric/Behavioral:  Negative for depression. The patient is not nervous/anxious.       PHYSICAL EXAMINATION   Onc Performance Status - 05/08/24 1500       KPS SCALE   KPS % SCORE Normal, no compliants, no evidence of disease  Vitals:   05/08/24 1521  BP: 136/72  Pulse: 91  Resp: (!) 91  Temp: 98.1 F (36.7 C)  SpO2: 100%     Physical Exam Constitutional:      General: She is not in acute distress.    Appearance: Normal appearance. She is not toxic-appearing.  HENT:     Head: Normocephalic and atraumatic.     Mouth/Throat:     Mouth: Mucous membranes are moist.     Pharynx: Oropharynx is clear. No oropharyngeal exudate or posterior oropharyngeal erythema.  Eyes:     General: No scleral icterus. Cardiovascular:     Rate and Rhythm: Normal rate and regular rhythm.     Pulses: Normal pulses.     Heart sounds: Normal heart sounds.  Pulmonary:     Effort: Pulmonary effort is normal.     Breath sounds: Normal breath sounds.  Chest:     Comments: Bilateral breasts examined. No palpable masses. In the left breast there appears to be significant amount of scar tissue Abdominal:     General: Abdomen is flat. Bowel sounds are normal. There is no distension.     Palpations: Abdomen is soft.     Tenderness: There is no abdominal tenderness.  Musculoskeletal:        General: No swelling.     Cervical back: Neck supple.  Lymphadenopathy:     Cervical: No cervical adenopathy.     Upper Body:     Right upper body: No axillary adenopathy.     Left upper body: No axillary adenopathy.  Skin:    General: Skin is warm and dry.     Findings: No rash.  Neurological:     General: No focal deficit present.     Mental Status: She is alert.  Psychiatric:        Mood and Affect: Mood normal.        Behavior: Behavior normal.      ASSESSMENT and THERAPY PLAN:   Malignant neoplasm of upper-inner quadrant of right breast in female, estrogen receptor negative (HCC)  Patient is now on surveillance, previously completed adjuvant immunotherapy and neoadjuvant chemotherapy.  She has no adverse effects from the chemoimmunotherapy at this point.  She is not on antiestrogen therapy, had triple negative disease.  She is now post reconstruction and satisfied with the cosmetic outcome.  No concern for recurrence on local exam or from review of systems.  Assessment and Plan Assessment & Plan Malignant neoplasm of upper-inner quadrant of right breast, status post chemotherapy and reconstruction Breast cancer well-managed, no new issues post-chemotherapy and reconstruction. Proceed with scheduled mammogram in December. No new concern for recurrence.  Gastric ulcer Requires ongoing management with pantoprazole . - Refill pantoprazole  prescription and send to [facility] pharmacy.  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 - Recommend biotin supplementation and healthy eating for hair thinning.  All questions were answered. The patient knows to call the clinic with any problems, questions or concerns. We can certainly see the patient much sooner if necessary.  Total encounter time:30 minutes*in face-to-face visit time, chart review, lab review, care coordination, order entry, and documentation of the encounter time.   *Total Encounter Time as defined by the Centers for Medicare and Medicaid Services includes, in addition to the face-to-face time of a patient visit (documented in the  note above) non-face-to-face time: obtaining and reviewing outside history, ordering and reviewing medications,  tests or procedures, care coordination (communications with other health care professionals or caregivers) and documentation in the medical record.

## 2024-05-09 ENCOUNTER — Ambulatory Visit: Admitting: Hematology and Oncology

## 2024-05-16 ENCOUNTER — Inpatient Hospital Stay

## 2024-05-16 DIAGNOSIS — C50211 Malignant neoplasm of upper-inner quadrant of right female breast: Secondary | ICD-10-CM | POA: Diagnosis not present

## 2024-05-16 DIAGNOSIS — Z171 Estrogen receptor negative status [ER-]: Secondary | ICD-10-CM

## 2024-05-16 LAB — CBC WITH DIFFERENTIAL/PLATELET
Abs Immature Granulocytes: 0.05 K/uL (ref 0.00–0.07)
Basophils Absolute: 0.1 K/uL (ref 0.0–0.1)
Basophils Relative: 1 %
Eosinophils Absolute: 0.4 K/uL (ref 0.0–0.5)
Eosinophils Relative: 5 %
HCT: 40.2 % (ref 36.0–46.0)
Hemoglobin: 12.9 g/dL (ref 12.0–15.0)
Immature Granulocytes: 1 %
Lymphocytes Relative: 33 %
Lymphs Abs: 2.6 K/uL (ref 0.7–4.0)
MCH: 28.2 pg (ref 26.0–34.0)
MCHC: 32.1 g/dL (ref 30.0–36.0)
MCV: 87.8 fL (ref 80.0–100.0)
Monocytes Absolute: 0.6 K/uL (ref 0.1–1.0)
Monocytes Relative: 7 %
Neutro Abs: 4.3 K/uL (ref 1.7–7.7)
Neutrophils Relative %: 53 %
Platelets: 229 K/uL (ref 150–400)
RBC: 4.58 MIL/uL (ref 3.87–5.11)
RDW: 14.1 % (ref 11.5–15.5)
WBC: 8.1 K/uL (ref 4.0–10.5)
nRBC: 0 % (ref 0.0–0.2)

## 2024-07-11 ENCOUNTER — Ambulatory Visit: Admitting: Gastroenterology

## 2024-08-15 ENCOUNTER — Ambulatory Visit: Admitting: Nurse Practitioner

## 2024-08-15 VITALS — BP 130/86 | HR 77 | Temp 97.8°F | Ht 62.0 in | Wt 164.5 lb

## 2024-08-15 DIAGNOSIS — H6123 Impacted cerumen, bilateral: Secondary | ICD-10-CM | POA: Insufficient documentation

## 2024-08-15 NOTE — Patient Instructions (Signed)
 Debrox drops - follow instructions on package

## 2024-08-15 NOTE — Assessment & Plan Note (Signed)
 Bilateral impacted cerumen Significant cerumen impaction in the left ear obstructed tympanic membrane visualization. Lavage improved left ear condition. Right ear lavage caused discomfort, cerumen remains. - Encouraged acetaminophen  or ibuprofen for soreness. - Advised Debrox drops at home per package instructions. - Instructed to return if unable to remove ear wax or for further evaluation.

## 2024-08-15 NOTE — Progress Notes (Signed)
° °  Established Patient Office Visit  Subjective   Patient ID: Penny Brown, female    DOB: 06/18/1971  Age: 53 y.o. MRN: [id number]  Chief Complaint  Patient presents with   Ear Fullness    Ear fullness for about 6 month     Discussed the use of AI scribe software for clinical note transcription with the patient, who gave verbal consent to proceed.  History of Present Illness Penny Brown is a 53 year old female who presents with ear fullness and intermittent hearing loss.  Ear fullness and hearing loss - Intermittent ear fullness for 6 months - Associated transient hearing loss - Described as pressure and a foggy sensation - Symptoms sometimes improve when the ear opens up - Home ear cleaning attempts have not provided relief - No recent ear or respiratory infections  Sinus symptoms - Intermittent sinus issues predating onset of ear symptoms - No use of allergy medications or other treatments for sinus problems      ROS    Objective:     BP 130/86   Pulse 77   Temp 97.8 F (36.6 C) (Temporal)   Ht 5' 2 (1.575 m)   Wt 164 lb 8 oz (74.6 kg)   SpO2 99%   BMI 30.09 kg/m    Physical Exam Vitals reviewed.  Constitutional:      General: She is not in acute distress.    Appearance: Normal appearance.  HENT:     Head: Normocephalic and atraumatic.     Right Ear: There is impacted cerumen.     Left Ear: There is impacted cerumen.  Cardiovascular:     Rate and Rhythm: Normal rate.     Pulses: Normal pulses.  Pulmonary:     Effort: Pulmonary effort is normal.  Skin:    General: Skin is warm and dry.  Neurological:     General: No focal deficit present.     Mental Status: She is alert and oriented to person, place, and time.  Psychiatric:        Mood and Affect: Mood normal.        Behavior: Behavior normal.        Judgment: Judgment normal.      No results found for any visits on 08/15/24.    The ASCVD Risk score (Arnett DK, et al.,  2019) failed to calculate for the following reasons:   Cannot find a previous HDL lab   Cannot find a previous total cholesterol lab   * - Cholesterol units were assumed    Assessment & Plan:   Problem List Items Addressed This Visit   None Assessment and Plan Assessment & Plan Bilateral impacted cerumen Significant cerumen impaction in the left ear obstructed tympanic membrane visualization. Lavage improved left ear condition. Right ear lavage caused discomfort, cerumen remains. - Encouraged acetaminophen  or ibuprofen for soreness. - Advised Debrox drops at home per package instructions. - Instructed to return if unable to remove ear wax or for further evaluation.     Return if symptoms worsen or fail to improve.    Penny FORBES Pereyra, NP

## 2024-08-21 ENCOUNTER — Ambulatory Visit: Payer: Self-pay

## 2024-08-21 NOTE — Telephone Encounter (Signed)
°  FYI Only or Action Required?: Action required by provider: request for appointment, clinical question for provider, and update on patient condition.  Patient was last seen in primary care on 08/15/2024 by Elnor Lauraine BRAVO, NP.  Called Nurse Triage reporting Otalgia.  Symptoms began several days ago.  Interventions attempted: Nothing.  Symptoms are: gradually worsening.  Triage Disposition: See Physician Within 24 Hours  Patient/caregiver understands and will follow disposition?: Yes  Message from Taleah C sent at 08/21/2024  2:43 PM EST  Reason for Triage: pt called back because her sxs of hearing loss and ear drainage has not improved. She had an appt on 12/12 for this issue.   Reason for Disposition  Earache lasts > 1 hour  Answer Assessment - Initial Assessment Questions 1. LOCATION: Which ear is involved?       R Ear   2. SENSATION: Describe how the ear feels. (e.g., stuffy, full, plugged).      Congestion  3. ONSET:  When did the ear symptoms start?        X 6 days  4. PAIN: Do you also have an earache? If Yes, ask: How bad is it? (Scale 0-10; none, mild, moderate or severe)  Denies       5. CAUSE: What do you think is causing the ear congestion? (e.g., common cold, nasal allergies, recent flight, recent snorkeling) Compacted with earwax       6. OTHER SYMPTOMS:       Pt reports ear drainage - clear in color with faint orange. Nasal drainage- clear, cough Pt states drainage started after manual removal of earwax in office.  Pt reports ear congestion of right ear not resolved after home care. Pt seen on 08/15/24 for symptoms.  Pt states she did not use the ear drops because she was afraid to administer them due to the pain. Pt advised not to stick anything in ear to dislodge wax. Due to lack of availability in clinic for an appt and pt declining UC, routing to clinic to assist with scheduling appt with pt and addressing pt's concerns. Pt agrees with  plan of care, will call back for any worsening symptoms  Protocols used: Ear - Congestion-A-AH

## 2024-09-01 ENCOUNTER — Ambulatory Visit
Admission: RE | Admit: 2024-09-01 | Discharge: 2024-09-01 | Disposition: A | Source: Ambulatory Visit | Attending: Adult Health | Admitting: Adult Health

## 2024-09-01 DIAGNOSIS — Z1231 Encounter for screening mammogram for malignant neoplasm of breast: Secondary | ICD-10-CM

## 2024-09-10 ENCOUNTER — Ambulatory Visit: Admitting: Family Medicine

## 2024-10-01 ENCOUNTER — Telehealth: Payer: Self-pay | Admitting: *Deleted

## 2024-10-01 NOTE — Telephone Encounter (Signed)
 Attempted to contact patient. LVM with following info and also sent Mychart message:  Dr. Loretha reviewed your symptoms and recommends an in office evaluation for you. [facility] has openings tomorrow. Please contact the office in the morning and they will be able to schedule an appointment for you.

## 2024-10-01 NOTE — Telephone Encounter (Signed)
 Returned call to patient after she left the following VM: She is having tenderness under her right arm and down the right side of her incision site and above the incision where the lymph node was removed. She's not sure if it's scar tissue or what it might be.   When contacted, patient reports feeling a small area above the site where the lymph node was removed. When touched, it feels like it might be fluid filled. It appears slightly puffy - different than it usually looks. Was warm a few days ago, but now no difference in temperature from rest of body.

## 2024-10-02 ENCOUNTER — Telehealth: Payer: Self-pay

## 2024-10-02 NOTE — Telephone Encounter (Signed)
 Per Iruku MD, appt scheduled w/ smc w/ Morna NP tomorrow at 55. Pt notified and verbalized understanding.   She is having tenderness under her right arm and down the right side of her incision site and above the incision where the lymph node was removed. She's not sure if it's scar tissue or what it might be.    When contacted, patient reports feeling a small area above the site where the lymph node was removed. When touched, it feels like it might be fluid filled. It appears slightly puffy - different than it usually looks. Was warm a few days ago, but now no difference in temperature from rest of body.

## 2024-10-03 ENCOUNTER — Inpatient Hospital Stay: Admitting: Adult Health

## 2024-10-06 ENCOUNTER — Other Ambulatory Visit: Payer: Self-pay

## 2024-10-06 ENCOUNTER — Inpatient Hospital Stay: Admitting: Nurse Practitioner

## 2024-10-10 ENCOUNTER — Telehealth: Payer: Self-pay | Admitting: Hematology and Oncology

## 2024-10-10 NOTE — Telephone Encounter (Signed)
 I left voicemail for patient regarding her request to reschedule 05/12/2025 MD appointment.

## 2024-10-10 NOTE — Telephone Encounter (Signed)
 I left voicemail for patient regarding rescheduled MD appointment to 05/13/2025 at 3:45 pm per her request.

## 2025-05-12 ENCOUNTER — Ambulatory Visit: Admitting: Hematology and Oncology

## 2025-05-13 ENCOUNTER — Inpatient Hospital Stay: Admitting: Hematology and Oncology
